# Patient Record
Sex: Female | Born: 1950 | Race: White | Hispanic: No | Marital: Married | Smoking: Former smoker
Health system: Southern US, Community
[De-identification: ages and names within clinical notes are randomized; demographics above are authoritative.]

## PROBLEM LIST (undated history)

## (undated) DIAGNOSIS — I219 Acute myocardial infarction, unspecified: Secondary | ICD-10-CM

## (undated) DIAGNOSIS — I671 Cerebral aneurysm, nonruptured: Secondary | ICD-10-CM

## (undated) DIAGNOSIS — Z8673 Personal history of transient ischemic attack (TIA), and cerebral infarction without residual deficits: Secondary | ICD-10-CM

## (undated) DIAGNOSIS — G2581 Restless legs syndrome: Secondary | ICD-10-CM

## (undated) DIAGNOSIS — G4733 Obstructive sleep apnea (adult) (pediatric): Secondary | ICD-10-CM

## (undated) DIAGNOSIS — K219 Gastro-esophageal reflux disease without esophagitis: Secondary | ICD-10-CM

## (undated) DIAGNOSIS — M1990 Unspecified osteoarthritis, unspecified site: Secondary | ICD-10-CM

## (undated) DIAGNOSIS — J189 Pneumonia, unspecified organism: Secondary | ICD-10-CM

## (undated) DIAGNOSIS — H269 Unspecified cataract: Secondary | ICD-10-CM

## (undated) DIAGNOSIS — R002 Palpitations: Secondary | ICD-10-CM

## (undated) DIAGNOSIS — E785 Hyperlipidemia, unspecified: Secondary | ICD-10-CM

## (undated) DIAGNOSIS — Z8719 Personal history of other diseases of the digestive system: Secondary | ICD-10-CM

## (undated) DIAGNOSIS — I714 Abdominal aortic aneurysm, without rupture, unspecified: Secondary | ICD-10-CM

## (undated) DIAGNOSIS — I2510 Atherosclerotic heart disease of native coronary artery without angina pectoris: Secondary | ICD-10-CM

## (undated) DIAGNOSIS — I4891 Unspecified atrial fibrillation: Secondary | ICD-10-CM

## (undated) DIAGNOSIS — I639 Cerebral infarction, unspecified: Secondary | ICD-10-CM

## (undated) DIAGNOSIS — R001 Bradycardia, unspecified: Secondary | ICD-10-CM

## (undated) DIAGNOSIS — E611 Iron deficiency: Secondary | ICD-10-CM

## (undated) DIAGNOSIS — I10 Essential (primary) hypertension: Secondary | ICD-10-CM

## (undated) HISTORY — PX: CARPAL TUNNEL RELEASE: SHX101

## (undated) HISTORY — PX: ABDOMINAL HYSTERECTOMY: SHX81

## (undated) HISTORY — DX: Essential (primary) hypertension: I10

## (undated) HISTORY — DX: Personal history of transient ischemic attack (TIA), and cerebral infarction without residual deficits: Z86.73

## (undated) HISTORY — DX: Unspecified atrial fibrillation: I48.91

## (undated) HISTORY — DX: Hyperlipidemia, unspecified: E78.5

## (undated) HISTORY — DX: Obstructive sleep apnea (adult) (pediatric): G47.33

## (undated) HISTORY — DX: Atherosclerotic heart disease of native coronary artery without angina pectoris: I25.10

## (undated) HISTORY — DX: Palpitations: R00.2

## (undated) HISTORY — DX: Unspecified cataract: H26.9

## (undated) HISTORY — DX: Bradycardia, unspecified: R00.1

---

## 2007-10-14 HISTORY — PX: CORONARY ARTERY BYPASS GRAFT: SHX141

## 2008-07-20 ENCOUNTER — Encounter: Payer: Self-pay | Admitting: Cardiology

## 2008-07-22 ENCOUNTER — Encounter: Payer: Self-pay | Admitting: Cardiology

## 2009-01-02 ENCOUNTER — Encounter: Payer: Self-pay | Admitting: Cardiology

## 2009-04-19 ENCOUNTER — Encounter: Payer: Self-pay | Admitting: Cardiology

## 2009-05-07 ENCOUNTER — Encounter: Payer: Self-pay | Admitting: Cardiology

## 2010-08-11 ENCOUNTER — Inpatient Hospital Stay (HOSPITAL_COMMUNITY): Admission: EM | Admit: 2010-08-11 | Discharge: 2010-08-13 | Payer: Self-pay | Admitting: Cardiology

## 2010-08-11 ENCOUNTER — Encounter: Payer: Self-pay | Admitting: Physician Assistant

## 2010-08-11 ENCOUNTER — Ambulatory Visit: Payer: Self-pay | Admitting: Internal Medicine

## 2010-08-12 ENCOUNTER — Encounter: Payer: Self-pay | Admitting: Cardiovascular Disease

## 2010-08-13 ENCOUNTER — Encounter: Payer: Self-pay | Admitting: Physician Assistant

## 2010-08-13 ENCOUNTER — Encounter: Payer: Self-pay | Admitting: Cardiology

## 2010-08-27 ENCOUNTER — Ambulatory Visit: Payer: Self-pay | Admitting: Physician Assistant

## 2010-08-27 DIAGNOSIS — I495 Sick sinus syndrome: Secondary | ICD-10-CM | POA: Insufficient documentation

## 2010-08-27 DIAGNOSIS — R002 Palpitations: Secondary | ICD-10-CM

## 2010-08-27 DIAGNOSIS — E785 Hyperlipidemia, unspecified: Secondary | ICD-10-CM

## 2010-09-01 ENCOUNTER — Encounter: Payer: Self-pay | Admitting: Physician Assistant

## 2010-09-13 ENCOUNTER — Ambulatory Visit: Payer: Self-pay | Admitting: Physician Assistant

## 2010-09-13 DIAGNOSIS — I498 Other specified cardiac arrhythmias: Secondary | ICD-10-CM

## 2010-09-19 ENCOUNTER — Encounter: Payer: Self-pay | Admitting: Physician Assistant

## 2010-09-30 ENCOUNTER — Ambulatory Visit: Payer: Self-pay | Admitting: Physician Assistant

## 2010-09-30 ENCOUNTER — Encounter: Payer: Self-pay | Admitting: Physician Assistant

## 2010-10-03 ENCOUNTER — Encounter (INDEPENDENT_AMBULATORY_CARE_PROVIDER_SITE_OTHER): Payer: Self-pay | Admitting: *Deleted

## 2010-10-29 ENCOUNTER — Ambulatory Visit
Admission: RE | Admit: 2010-10-29 | Discharge: 2010-10-29 | Payer: Self-pay | Source: Home / Self Care | Attending: Cardiology | Admitting: Cardiology

## 2010-10-29 DIAGNOSIS — R059 Cough, unspecified: Secondary | ICD-10-CM | POA: Insufficient documentation

## 2010-10-29 DIAGNOSIS — R05 Cough: Secondary | ICD-10-CM | POA: Insufficient documentation

## 2010-10-29 DIAGNOSIS — I10 Essential (primary) hypertension: Secondary | ICD-10-CM | POA: Insufficient documentation

## 2010-10-29 DIAGNOSIS — I2510 Atherosclerotic heart disease of native coronary artery without angina pectoris: Secondary | ICD-10-CM | POA: Insufficient documentation

## 2010-11-12 NOTE — Letter (Signed)
Summary: External Correspondence/ DR. Elmo Putt  External Correspondence/ DR. Elmo Putt   Imported By: Dorise Hiss 09/09/2010 15:27:59  _____________________________________________________________________  External Attachment:    Type:   Image     Comment:   External Document

## 2010-11-12 NOTE — Procedures (Signed)
Summary: Urgent MCOT Notification  Urgent MCOT Notification   Imported By: Cyril Loosen, RN, BSN 09/02/2010 09:14:48  _____________________________________________________________________  External Attachment:    Type:   Image     Comment:   External Document  Appended Document: Urgent MCOT Notification will review results at OV on 12/2

## 2010-11-12 NOTE — Cardiovascular Report (Signed)
Summary: Cardiac Cath Other  Cardiac Cath Other   Imported By: Zachary George 08/27/2010 10:25:44  _____________________________________________________________________  External Attachment:    Type:   Image     Comment:   External Document

## 2010-11-12 NOTE — Letter (Signed)
Summary: External Correspondence/ DR. Elmo Putt  External Correspondence/ DR. Elmo Putt   Imported By: Dorise Hiss 09/09/2010 15:29:35  _____________________________________________________________________  External Attachment:    Type:   Image     Comment:   External Document

## 2010-11-12 NOTE — Assessment & Plan Note (Signed)
Summary: [facility]   Visit Type:  Follow-up Primary Provider:  Harrel Carina   History of Present Illness: patient presents to this office for the first time, following recent hospitalization at [facility] with unstable angina. She presented initially to [facility] ED with history of multivessel CAD, status post CABG, 10/ 09, in [place], and remote PCI.  Patient had a peak troponin level of 0.08, and was found to have severe native 3 vessel CAD and mild LVD. She had patent LIMA-LAD and SVG-PDA grafts;  100% occluded SVG-OM graft; she underwent successful DES to native left circumflex (x2).  Since discharge, she has had some mild, recurrent "throat tightness", which she refers to as her presenting symptoms. She denies any frank exertional chest discomfort. These episodes occurred last Thursday, and were preceded by "fluttering", approximately one hour before.  Of note, patient states that she has been previously evaluated with a monitor, back in 2010. She was placed on medical therapy, and there was some discussion of possible pacemaker. During this admission, there was indication that she developed bradycardia. diltiazem was held, and labetalol was decreased.  Patient quit smoking in 2009.  Preventive Screening-Counseling & Management  Alcohol-Tobacco     Smoking Status: quit     Year Quit: 07/2008  Current Medications (verified): 1)  Aspirin 325 Mg Tabs (Aspirin) .... Take 1 Tablet By Mouth Once A Day 2)  Plavix 75 Mg Tabs (Clopidogrel Bisulfate) .... Take 1 Tablet By Mouth Once A Day 3)  Nitrostat 0.4 Mg Subl (Nitroglycerin) .... Use As Directed Chest Pain 4)  Lisinopril 40 Mg Tabs (Lisinopril) .... Take 1 Tablet By Mouth Once A Day 5)  Hydrochlorothiazide 25 Mg Tabs (Hydrochlorothiazide) .... Take 1 Tablet By Mouth Once A Day 6)  Alprazolam 0.25 Mg Tabs (Alprazolam) .... Take 1 Tablet By Mouth Three Times A Day 7)  Labetalol Hcl 200 Mg Tabs (Labetalol Hcl) .... Take 1  Tablet By Mouth Two Times A Day 8)  Hydralazine Hcl 25 Mg Tabs (Hydralazine Hcl) .... Take 1 Tablet By Mouth Three Times A Day 9)  Bupropion Hcl 100 Mg Tabs (Bupropion Hcl) .... Take 1 Tablet By Mouth Two Times A Day 10)  Simvastatin 40 Mg Tabs (Simvastatin) .... Take 1 Tablet By Mouth Once A Day 11)  Chelated Potassium 99 Mg Tabs (Potassium) .... Take 1 Tablet By Mouth Once A Day  Allergies (verified): No Known Drug Allergies  Comments:  Nurse/Medical Assistant: The patient's medication bottles and allergies were reviewed with the patient and were updated in the Medication and Allergy Lists.  Social History: Smoking Status:  quit  Review of Systems       No fevers, chills, hemoptysis, dysphagia, melena, hematocheezia, hematuria, rash, claudication, orthopnea, pnd, pedal edema. All other systems negative.   Vital Signs:  Patient profile:   60 year old female Height:      65 inches Weight:      186 pounds BMI:     31.06 Pulse rate:   71 / minute BP sitting:   167 / 90  (left arm) Cuff size:   regular  Vitals Entered By: Carlye Grippe (August 27, 2010 9:56 AM)  Nutrition Counseling: Patient's BMI is greater than 25 and therefore counseled on weight management options.  Physical Exam  Additional Exam:  GEN: 12 -year-old female, sitting upright, no distress HEENT: NCAT,PERRLA,EOMI NECK: palpable pulses, no bruits; no JVD; no TM LUNGS: CTA bilaterally HEART: RRR (S1S2); no significant murmurs; no rubs; no gallops ABD: soft,  NT; intact BS EXT: palpable right femoral pulse; no hematoma, bruit; no edema SKIN: warm, dry MUSC: no obvious deformity NEURO: A/O (x3)     EKG  Procedure date:  08/27/2010  Findings:      normal sinus rhythm at 65 bpm; normal axist; persistent ST coving in inferior leads; new, small T-wave inversion in V3, with otherwise persistent small T-wave inversion changes in V4-V6  Impression & Recommendations:  Problem # 1:  CAD  (ICD-414.00)  patient is status post recent DES of native circumflex artery, with residual severe native three-vessel CAD and 2/3 patent bypass grafts; mild LVD (EF 50%). She has had some residual chest discomfort, of brief duration, for which she has not taken nitroglycerin. Of note, this was preceded by palpitations. Also, patient did not take any of her medications, prior to today's visit. She does report compliance with her medications, including Plavix, and was provided with an application for financial assistance. I reviewed today's EKG with Dr. Diona Browner, who felt that there was no significant change with respect to her previous study at [facility]. Patient currently denies any chest discomfort, but has some intermittent atypical symptoms. She denies any frank exertional chest discomfort. Will continue current medication regimen, and schedule early clinic visit with me and Dr. Diona Browner, in approximate 3 weeks.  Orders: EKG w/ Interpretation (93000)  Problem # 2:  PALPITATIONS (ICD-785.1)  Will order CardioNet monitor (x3 weeks), to rule out intermittent dysrhythmia. Patient states she has previously been evaluated for this in 2010, in [place], [place]. We will request records from her previous cardiologist, Dr. Clarene Critchley.  Orders: Cardiac Catheterization (Cardiac Cath) EKG w/ Interpretation (93000)  Problem # 3:  DYSLIPIDEMIA (ICD-272.4)  aggressive management recommend with target LDL 70 or less, if feasible. We'll need to reassess lipid status in 12 weeks.  Problem # 4:  HYPERTENSION (ICD-401.9)  currently uncontrolled; however, patient did not take any of her medications this morning. We'll continue to monitor closely.  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  Orders: Cardiac Catheterization (Cardiac Cath)  Patient  Instructions: 1)  Follow up with Gene on Friday, September 13, 2010 at 1:40pm. 2)  Your physician has recommended that you wear an event monitor.  Event monitors are medical devices that record the heart's electrical activity. Doctors most often use these monitors to diagnose arrhythmias. Arrhythmias are problems with the speed or rhythm of the heartbeat. The monitor is a small, portable device. You can wear one while you do your normal daily activities. This is usually used to diagnose what is causing palpitations/syncope (passing out). 3)  Your physician recommends that you continue on your current medications as directed. Please refer to the Current Medication list given to you today.  Appended Document: Orders Update    Clinical Lists Changes  Orders: Added new Referral order of Cardionet/Event Monitor (Cardionet/Event) - Signed

## 2010-11-12 NOTE — Assessment & Plan Note (Signed)
Summary: 2 WK F/U PER 11/15 OV W/GENE-JM   Visit Type:  Follow-up Primary Provider:  Harrel Carina   History of Present Illness: patient returns for early scheduled followup.   When last seen, I ordered a cardiac monitor for evaluation of frequent tachypalpitations. I just now reviewed these strips, in conjunction with Dr. Diona Browner. There are some strips which are suggestive of atrial fibrillation/flutter, but of brief duration. Rates are in the 150-180 range. Additional strips were interpreted as PSVT; frequent PACs a brief runs of AT, were also noted.  Patient does report recurrent tachypalpitations, perhaps 2-3 times a week. She reports some associated weakness and fatigue, but no presyncope/syncope.  She denies any symptoms similar to her recent presentation, requiring PCI at [facility]. She also denies symptoms suggestive of CHF.  Preventive Screening-Counseling & Management  Alcohol-Tobacco     Smoking Status: quit     Year Quit: 2009  Caffeine-Diet-Exercise     Does Patient Exercise: yes  Current Medications (verified): 1)  Aspirin 325 Mg Tabs (Aspirin) .... Take 1 Tablet By Mouth Once A Day 2)  Plavix 75 Mg Tabs (Clopidogrel Bisulfate) .... Take 1 Tablet By Mouth Once A Day 3)  Nitrostat 0.4 Mg Subl (Nitroglycerin) .... Use As Directed Chest Pain 4)  Lisinopril 40 Mg Tabs (Lisinopril) .... Take 1 Tablet By Mouth Once A Day 5)  Hydrochlorothiazide 25 Mg Tabs (Hydrochlorothiazide) .... Take 1 Tablet By Mouth Once A Day 6)  Alprazolam 0.25 Mg Tabs (Alprazolam) .... Take 1 Tablet By Mouth Three Times A Day 7)  Labetalol Hcl 200 Mg Tabs (Labetalol Hcl) .... Take 1 Tablet By Mouth Two Times A Day 8)  Hydralazine Hcl 25 Mg Tabs (Hydralazine Hcl) .... Take 1 Tablet By Mouth Three Times A Day 9)  Bupropion Hcl 100 Mg Tabs (Bupropion Hcl) .... Take 1 Tablet By Mouth Two Times A Day 10)  Simvastatin 40 Mg Tabs (Simvastatin) .... Take 1 Tablet By Mouth Once A Day 11)  Chelated  Potassium 99 Mg Tabs (Potassium) .... Take 1 Tablet By Mouth Once A Day  Allergies (verified): No Known Drug Allergies  Comments:  Nurse/Medical Assistant: The patient's medication bottles and allergies were reviewed with the patient and were updated in the Medication and Allergy Lists.  Past History:  Past Medical History: CAD... DES of native CFX,10/10, residual 100% occluded SVG-OM; patent LIMA-LAD, SVG-PDA grafts; EF 50%... status post CABG,10/09, [place], [place] Hyperlipidemia Hypertension bradycardia... diltiazem discontinued  Past Surgical History: hysterectomy 1984 bilateral carpul tunnel surgery  Family History: Father:died age 1 MI Mother:healthy Siblings:1 brother HTN 1 sister-healthy 1 daughter- healthy 1 son-healty  Social History: Disabled  Married  Tobacco Use - Former.  Alcohol Use - yes  occasional wine or beer Regular Exercise - yes Does Patient Exercise:  yes  Review of Systems       No fevers, chills, hemoptysis, dysphagia, melena, hematocheezia, hematuria, rash, claudication, orthopnea, pnd, pedal edema. All other systems negative.   Vital Signs:  Patient profile:   60 year old female Height:      65 inches Weight:      187 pounds Pulse rate:   71 / minute BP sitting:   142 / 87  (left arm) Cuff size:   large  Vitals Entered By: Carlye Grippe (September 13, 2010 1:49 PM)  Physical Exam  Additional Exam:  GEN: 69 -year-old female, sitting upright, no distress HEENT: NCAT,PERRLA,EOMI NECK: palpable pulses, no bruits; no JVD; no TM LUNGS: CTA bilaterally HEART:  RRR (S1S2); no significant murmurs; no rubs; no gallops ABD: soft, NT; intact BS EXT: palpable right femoral pulse; no hematoma, bruit; no edema SKIN: warm, dry MUSC: no obvious deformity NEURO: A/O (x3)     Impression & Recommendations:  Problem # 1:  SUPRAVENTRICULAR TACHYCARDIA (ICD-427.89)  Will adjust current medication regimen with substitution of labetalol with  Lopressor 25 mg b.i.d. rhythm strips have been reviewed, and some are suggestive of PAF/flutter with rates in the 150-170 range. She has a CHADS score of 1 (HTN). She is currently on full dose aspirin and Plavix, following recent drug-eluting stening. We'll need to monitor closely for recurrent bradycardia, for which she was recently taken off diltiazem, at [facility]. Will schedule early return visit with me in the next 2-3 weeks, for reassessment of frequency of palpitations, and close monitoring of heart rate.  Problem # 2:  CAD (ICD-414.00)  quiesced on current medication regimen. Continue Plavix for at least one year.  Problem # 3:  HYPERTENSION (ICD-401.9)  stable on current medication regimen  Problem # 4:  DYSLIPIDEMIA (ICD-272.4)  Will need to reassess next 4-6 weeks.  Patient Instructions: 1)  Follow up office visit with Gene on Monday, September 30, 2010 at 10:20am. 2)  Return cardionet monitor as instructed. 3)  Stop Labetalol. 4)  Start Lopressor (metoprolol tart) 25mg  by mouth two times a day.  Prescriptions: METOPROLOL TARTRATE 25 MG TABS (METOPROLOL TARTRATE) Take one tablet by mouth twice a day  #60 x 6   Entered by:   Cyril Loosen, RN, BSN   Authorized by:   Nelida Meuse, PA-C   Signed by:   Cyril Loosen, RN, BSN on 09/13/2010   Method used:   Electronically to        [facility]* (retail)       [address]       [place], [place]  [address]       Ph: [phone]       Fax: [phone]   RxID:   [id number]   Handout requested.  I have reviewed and approved all prescriptions at the time of the office visit. Nelida Meuse, PA-C  September 13, 2010 2:56 PM

## 2010-11-12 NOTE — Procedures (Signed)
Summary: Holter and Event/ [facility] / [facility]  Holter and Event/ [facility] / [facility]   Imported By: Dorise Hiss 09/09/2010 16:00:39  _____________________________________________________________________  External Attachment:    Type:   Image     Comment:   External Document

## 2010-11-14 NOTE — Letter (Signed)
Summary: Engineer, materials at [facility]  [address]   [place], [place] [address]   Phone: [phone]  Fax: [phone]        October 03, 2010 MRN: [id number]    [facility] [address] [place], [place]  [address]    Dear Ms. Fons,  Your test ordered by Selena Batten has been reviewed by your physician (or physician assistant) and was found to be normal or stable. Your physician (or physician assistant) felt no changes were needed at this time.  ____ Echocardiogram  ____ Cardiac Stress Test  __X__ Lab Work  ____ Peripheral vascular study of arms, legs or neck  ____ CT scan or X-ray  ____ Lung or Breathing test  ____ Other:   Thank you.   Cyril Loosen, RN, BSN    Duane Boston, M.D., F.A.C.C. Thressa Sheller, M.D., F.A.C.C. Oneal Grout, M.D., F.A.C.C. Cheree Ditto, M.D., F.A.C.C. Daiva Nakayama, M.D., F.A.C.C. Kenney Houseman, M.D., F.A.C.C. Jeanne Ivan, PA-C

## 2010-11-14 NOTE — Procedures (Signed)
Summary: MCOT END OF SUMMARY  MCOT END OF SUMMARY   Imported By: Cyril Loosen, RN, BSN 09/23/2010 09:53:38  _____________________________________________________________________  External Attachment:    Type:   Image     Comment:   External Document  Appended Document: MCOT END OF SUMMARY Pt will be notified of results at office visit on 12/19.

## 2010-11-14 NOTE — Assessment & Plan Note (Signed)
Summary: 2 WK F/U PER 12/2 OV-JM   Visit Type:  Follow-up Primary Provider:  Harrel Carina   History of Present Illness: patient presents for early scheduled followup. Please refer to my recent office note of 09/13/10, for complete details.  Patient reports some slight improvement in the frequency and intensity of her palpitations, since I substituted her labetalol with Lopressor. However, she continues to experience these essentially on a daily basis. Recent cardiac monitor strips were reviewed, in conjunction with Dr. Diona Browner, which were possibly suggestive of transient PAF/flutter with rates as high as 180 bpm, as well as frequent PACs and runs of AT. We previously assessed her as having a CHADS score of 1.  She also continues to experience occasional exertional throat tightness, reminiscent of her symptoms prior to her recent hospitalization. However, she has not had to take any nitroglycerin. She also sometimes has these symptoms in conjunction with her palpitations. Overall, she feels much better, since her hospitalization.  Preventive Screening-Counseling & Management  Alcohol-Tobacco     Smoking Status: quit     Year Quit: 2009  Current Medications (verified): 1)  Aspirin 325 Mg Tabs (Aspirin) .... Take 1 Tablet By Mouth Once A Day 2)  Plavix 75 Mg Tabs (Clopidogrel Bisulfate) .... Take 1 Tablet By Mouth Once A Day 3)  Nitrostat 0.4 Mg Subl (Nitroglycerin) .... Use As Directed Chest Pain 4)  Lisinopril 40 Mg Tabs (Lisinopril) .... Take 1 Tablet By Mouth Once A Day 5)  Hydrochlorothiazide 25 Mg Tabs (Hydrochlorothiazide) .... Take 1 Tablet By Mouth Once A Day 6)  Alprazolam 0.25 Mg Tabs (Alprazolam) .... Take 1 Tablet By Mouth Three Times A Day 7)  Hydralazine Hcl 25 Mg Tabs (Hydralazine Hcl) .... Take 1 Tablet By Mouth Three Times A Day 8)  Bupropion Hcl 100 Mg Tabs (Bupropion Hcl) .... Take 1 Tablet By Mouth Two Times A Day 9)  Simvastatin 40 Mg Tabs (Simvastatin) .... Take 1  Tablet By Mouth Once A Day 10)  Chelated Potassium 99 Mg Tabs (Potassium) .... Take 1 Tablet By Mouth Once A Day 11)  Metoprolol Tartrate 25 Mg Tabs (Metoprolol Tartrate) .... Take One Tablet By Mouth Twice A Day  Allergies (verified): No Known Drug Allergies  Comments:  Nurse/Medical Assistant: The patient's medications and allergies were reviewed with the patient and were updated in the Medication and Allergy Lists. Pt brought medication bottles to office visit.    Past History:  Past Medical History: Last updated: 09/13/2010 CAD... DES of native CFX,10/10, residual 100% occluded SVG-OM; patent LIMA-LAD, SVG-PDA grafts; EF 50%... status post CABG,10/09, [place], [place] Hyperlipidemia Hypertension bradycardia... diltiazem discontinued  Review of Systems       No fevers, chills, hemoptysis, dysphagia, melena, hematocheezia, hematuria, rash, claudication, orthopnea, pnd, pedal edema. All other systems negative.   Vital Signs:  Patient profile:   60 year old female Height:      65 inches Weight:      186 pounds Pulse rate:   71 / minute BP sitting:   155 / 89  (left arm) Cuff size:   regular  Vitals Entered By: Carlye Grippe (September 30, 2010 10:44 AM)  Physical Exam  Additional Exam:  GEN: 24 -year-old female, sitting upright, no distress HEENT: NCAT,PERRLA,EOMI NECK: palpable pulses, no bruits; no JVD; no TM LUNGS: CTA bilaterally HEART: RRR (S1S2); no significant murmurs; no rubs; no gallops ABD: soft, NT; intact BS EXT: palpable right femoral pulse; no hematoma, bruit; no edema SKIN: warm, dry MUSC: no  obvious deformity NEURO: A/O (x3)     Impression & Recommendations:  Problem # 1:  CAD (ICD-414.00)  Will add isosorbide mononitrate 30 mg daily, for added antianginal benefit. We'll schedule return visit with me in one month, for continued close monitoring and further possible medication adjustments.  Problem # 2:  PALPITATIONS (ICD-785.1)  Will increase  Lopressor to 50 mg b.i.d., for more aggressive treatment of her breakthrough tachycardia palpitations. Recent monitor indicated transient runs of PSVT, with possible PAF/flutter. She has a CHADS score of 1, and is currently on full dose aspirin/Plavix, following recent treatment with DES.  Problem # 3:  DYSLIPIDEMIA (ICD-272.4)  we'll reassess lipid status with fasting lipid profile. Continue current dose of simvastatin, pending results of her blood work.  Other Orders: T-Lipid Profile [phone]) T-Hepatic Function [phone])  Patient Instructions: 1)  Follow up with Dr. Diona Browner on Tues, October 29, 2010 at 1:40pm. 2)  Start Imdur (isosorbide) 30mg  by mouth once daily. 3)  Increase Lopressor (metoprolol) to 50mg  by mouth two times a day. You may take 2 of your 25mg  tablets until gone and then get new prescription for 50mg  tablets filled. 4)  Your physician recommends that you go to the [facility] for a FASTING lipid profile and liver function labs:  Do not eat or drink after midnight.  Prescriptions: METOPROLOL TARTRATE 50 MG TABS (METOPROLOL TARTRATE) Take one tablet by mouth twice a day  #60 x 6   Entered by:   Cyril Loosen, RN, BSN   Authorized by:   Nelida Meuse, PA-C   Signed by:   Cyril Loosen, RN, BSN on 09/30/2010   Method used:   Electronically to        [facility]* (retail)       [address]       [place], [place]  [address]       Ph: [phone]       Fax: [phone]   RxID:   [id number] ISOSORBIDE MONONITRATE CR 30 MG XR24H-TAB (ISOSORBIDE MONONITRATE) Take one tablet by mouth daily  #30 x 6   Entered by:   Cyril Loosen, RN, BSN   Authorized by:   Nelida Meuse, PA-C   Signed by:   Cyril Loosen, RN, BSN on 09/30/2010   Method used:   Electronically to        [facility]* (retail)       [address]       [place], [place]  [address]       Ph: [phone]       Fax: [phone]   RxID:    [id number]   Handout requested.  I have reviewed and approved all prescriptions at the time of the office visit. Nelida Meuse, PA-C  September 30, 2010 11:17 AM

## 2010-11-14 NOTE — Assessment & Plan Note (Signed)
Summary: 1 MO F/U PER 12/19 OV W/GENE-JM   Visit Type:  Follow-up Primary Provider:  Dr. Caryl Asp   History of Present Illness: 60 year old one presents for followup. She was last seen in December by Mr. Serpe.   She has had palpitations and a recent monitor indicated transient runs of PSVT, with possible PAF/flutter. She has a CHADS score of 1, and is currently on full dose aspirin/Plavix, following recent treatment with DES. Beta blocker dosing was advanced at the last visit, also placed on Imdur for angina suppression. She reports feeling better, fewer palpitations in general, no progressive angina.  Labs from 19 December showed AST 24, ALT 26, cholesterol 135, triglycerides 127, HDL 33, LDL 77. She states that she takes "vinegar" and reports compliance with her simvastatin.   Reports a long-term intermittent cough, questioning the possibility of medication side effect. She has been on lisinopril long-term.  No reported return to tobacco use since 2009 except for one "slip up." We discussed her diet today, also sodium restriction.  Preventive Screening-Counseling & Management  Alcohol-Tobacco     Smoking Status: quit     Year Quit: 2009  Current Medications (verified): 1)  Aspirin 325 Mg Tabs (Aspirin) .... Take 1 Tablet By Mouth Once A Day 2)  Plavix 75 Mg Tabs (Clopidogrel Bisulfate) .... Take 1 Tablet By Mouth Once A Day 3)  Nitrostat 0.4 Mg Subl (Nitroglycerin) .... Use As Directed Chest Pain 4)  Lisinopril 40 Mg Tabs (Lisinopril) .... Take 1 Tablet By Mouth Once A Day 5)  Hydrochlorothiazide 25 Mg Tabs (Hydrochlorothiazide) .... Take 1 Tablet By Mouth Once A Day 6)  Hydralazine Hcl 25 Mg Tabs (Hydralazine Hcl) .... Take 1 Tablet By Mouth Three Times A Day 7)  Bupropion Hcl 100 Mg Tabs (Bupropion Hcl) .... Take 1 Tablet By Mouth Two Times A Day 8)  Simvastatin 40 Mg Tabs (Simvastatin) .... Take 1 Tablet By Mouth Once A Day 9)  Chelated Potassium 99 Mg Tabs (Potassium) ....  Take 1 Tablet By Mouth Once A Day 10)  Metoprolol Tartrate 50 Mg Tabs (Metoprolol Tartrate) .... Take One Tablet By Mouth Twice A Day 11)  Isosorbide Mononitrate Cr 30 Mg Xr24h-Tab (Isosorbide Mononitrate) .... Take One Tablet By Mouth Daily  Allergies (verified): No Known Drug Allergies  Comments:  Nurse/Medical Assistant: The patient's medications and allergies were reviewed with the patient and were updated in the Medication and Allergy Lists. Reviewed list w/ pt. Tammi Romine CMA (October 29, 2010 1:45 PM)  Past History:  Social History: Last updated: 09/13/2010 Disabled  Married  Tobacco Use - Former.  Alcohol Use - yes  occasional wine or beer Regular Exercise - yes  Past Medical History: CAD - DES circ 10/10, residual 100% occluded SVG-OM, patent LIMA-LAD and SVG-PDA grafts; EF 50% Hyperlipidemia Hypertension Bradycardia Paliptations - transient PAT/AF, CHADS2 score 1 Cataracts  Past Surgical History: Hysterectomy Bilateral carpul tunnel surgery CABG [facility], LIMA to LAD, SVG to OM, SVG to PDA  Family History: Father: died age 43 MI Mother: healthy Siblings: 1 brother HTN, 1 sister - healthy 1 daughter- healthy 1 son - healthy  Review of Systems       The patient complains of dyspnea on exertion and prolonged cough.  The patient denies anorexia, fever, chest pain, syncope, hemoptysis, abdominal pain, melena, and hematochezia.         No syncope. Appetite stable. Otherwise reviewed and negative.  Vital Signs:  Patient profile:   61 year old  female Height:      65 inches Weight:      189 pounds BMI:     31.56 Pulse rate:   58 / minute BP sitting:   156 / 81  (left arm) Cuff size:   regular  Vitals Entered By: Fuller Plan CMA (October 29, 2010 1:45 PM)  Physical Exam  Additional Exam:  Overweight woman in no acute distress. HEENT: Conjunctiva and lids normal, oropharynx with moist mucosa. Neck: Supple, no elevated JVP, no bruits, no  thyromegaly. Lungs: Coarse breath sounds, otherwise clear, nonlabored. Cardiac: Regular rate and rhythm, no significant murmur, no S3. Abdomen: Soft, nontender pelvis is present. Extremities: Distal pulses one plus, no edema. Skin: Warm and dry, tattoo noted on back. Musculoskeletal: No kyphosis. Neuropsychiatric: Alert and oriented x3, affect appropriate.   Cardiac Cath  Procedure date:  08/13/2010  Findings:       PROCEDURAL FINDINGS:  Aortic pressure 186/96 with a mean of 129, left   ventricular pressure 174/15.      Left ventriculography shows inferobasal akinesis with a left ventricular   ejection fraction estimated at 50%.  The other LV wall segments are   normal.      NATIVE CORONARY ANGIOGRAPHY:  The right coronary artery is totally   occluded in the proximal aspect.      Left mainstem:  There is diffuse calcium in the left main with 40%   distal stenosis.      LAD:  The LAD is totally occluded at the ostium.  The vessel fills   entirely from the LIMA graft.      Ramus intermedius is a large branch.  There is no significant stenosis.   There are minor luminal irregularities.      Left circumflex:  The left circumflex is a tortuous vessel.  It has a   90% proximal lesion and a 95% mid lesion.  There is retrograde filling   of the occluded vein graft, which was anastomosed to the marginal   branch.      BYPASS GRAFT ANGIOGRAPHY:  The left internal mammary to LAD is widely   patent.  There is no significant stenosis in the LAD, which back-fills   to the proximal vessel.      Saphenous vein graft to the PDA is patent.  This also retrograde fills   the posterolateral branch of the vessel.      Saphenous vein graft to OM is occluded at the aortic anastomotic site.   Impression & Recommendations:  Problem # 1:  PALPITATIONS (ICD-785.1)  Better control following increase in Lopressor. No other changes made today. As noted previously, cardiac monitoring demonstrated  brief bursts of atrial tachycardia and possibly atrial fibrillation. Low CHADS2 score is noted. No plan for anticoagulation at this point.  Her updated medication list for this problem includes:    Aspirin 325 Mg Tabs (Aspirin) .Marland Kitchen... Take 1 tablet by mouth once a day    Plavix 75 Mg Tabs (Clopidogrel bisulfate) .Marland Kitchen... Take 1 tablet by mouth once a day    Nitrostat 0.4 Mg Subl (Nitroglycerin) ..... Use as directed chest pain    Metoprolol Tartrate 50 Mg Tabs (Metoprolol tartrate) .Marland Kitchen... Take one tablet by mouth twice a day    Isosorbide Mononitrate Cr 30 Mg Xr24h-tab (Isosorbide mononitrate) .Marland Kitchen... Take one tablet by mouth daily  Problem # 2:  CORONARY ATHEROSCLEROSIS NATIVE CORONARY ARTERY (ICD-414.01)  No progressive angina. Continue present regimen. Followup scheduled for 3 months.  Her updated medication  list for this problem includes:    Aspirin 325 Mg Tabs (Aspirin) .Marland Kitchen... Take 1 tablet by mouth once a day    Plavix 75 Mg Tabs (Clopidogrel bisulfate) .Marland Kitchen... Take 1 tablet by mouth once a day    Nitrostat 0.4 Mg Subl (Nitroglycerin) ..... Use as directed chest pain    Metoprolol Tartrate 50 Mg Tabs (Metoprolol tartrate) .Marland Kitchen... Take one tablet by mouth twice a day    Isosorbide Mononitrate Cr 30 Mg Xr24h-tab (Isosorbide mononitrate) .Marland Kitchen... Take one tablet by mouth daily  Problem # 3:  ESSENTIAL HYPERTENSION, BENIGN (ICD-401.1)  Blood pressure still not adequately controlled. Plan to advance hydralazine to 50 mg p.o. t.i.d. We discussed sodium restriction as well.  Her updated medication list for this problem includes:    Aspirin 325 Mg Tabs (Aspirin) .Marland Kitchen... Take 1 tablet by mouth once a day    Cozaar 50 Mg Tabs (Losartan potassium) .Marland Kitchen... Take 1 tablet by mouth once a day    Hydrochlorothiazide 25 Mg Tabs (Hydrochlorothiazide) .Marland Kitchen... Take 1 tablet by mouth once a day    Hydralazine Hcl 50 Mg Tabs (Hydralazine hcl) .Marland Kitchen... Take 1 tablet by mouth three times a day    Metoprolol Tartrate 50 Mg Tabs  (Metoprolol tartrate) .Marland Kitchen... Take one tablet by mouth twice a day  Problem # 4:  COUGH (ICD-786.2)  Chronic. Patient has been on lisinopril long-term. Will try to take her off lisinopril and replace it with Cozaar to see if this leads to any improvement.  Her updated medication list for this problem includes:     Aspirin 325 Mg Tabs (Aspirin) .Marland Kitchen... Take 1 tablet by mouth once a day    Plavix 75 Mg Tabs (Clopidogrel bisulfate) .Marland Kitchen... Take 1 tablet by mouth once a day    Nitrostat 0.4 Mg Subl (Nitroglycerin) ..... Use as directed chest pain    Metoprolol Tartrate 50 Mg Tabs (Metoprolol tartrate) .Marland Kitchen... Take one tablet by mouth twice a day    Isosorbide Mononitrate Cr 30 Mg Xr24h-tab (Isosorbide mononitrate) .Marland Kitchen... Take one tablet by mouth daily  Problem # 5:  DYSLIPIDEMIA (ICD-272.4)  Recent LDL looked favorable. Continue present regimen.  Her updated medication list for this problem includes:    Simvastatin 40 Mg Tabs (Simvastatin) .Marland Kitchen... Take 1 tablet by mouth once a day  Patient Instructions: 1)  Your physician wants you to follow-up in: 3 months. You will receive a reminder letter in the mail about two months in advance. If you don't receive a letter, please call our office to schedule the follow-up appointment. 2)  Your physician has recommended you make the following change in your medication: INCREASE HYDRALAZINE TO 50MG  THREE TIMES DAILY. You may take (2) of your 25mg  three times daily until they are finished. STOP LISINOPRIL AND START LOSARTAN 50MG  ONE DAILY. Your new prescriptions have been sent to your pharmacy. Prescriptions: HYDRALAZINE HCL 50 MG TABS (HYDRALAZINE HCL) Take 1 tablet by mouth three times a day  #90 x 3   Entered by:   Carlye Grippe   Authorized by:   Loreli Slot, MD, [facility]   Signed by:   Carlye Grippe on 10/29/2010   Method used:   Electronically to        [facility]* (retail)       [address]       [place], [place]  [address]        Ph: [phone]       Fax: [phone]   RxID:  [id number] COZAAR 50 MG TABS (LOSARTAN POTASSIUM) Take 1 tablet by mouth once a day  #30 x 3   Entered by:   Carlye Grippe   Authorized by:   Loreli Slot, MD, [facility]   Signed by:   Carlye Grippe on 10/29/2010   Method used:   Electronically to        [facility]* (retail)       [address]       [place], [place]  [address]       Ph: [phone]       Fax: [phone]   RxID:   [id number]

## 2010-11-15 NOTE — Consult Note (Signed)
Summary: CARDIOLOGY CONSULT/ [facility]  CARDIOLOGY CONSULT/ [facility]   Imported By: Zachary George 08/27/2010 10:37:32  _____________________________________________________________________  External Attachment:    Type:   Image     Comment:   External Document

## 2010-12-24 LAB — CBC
HCT: 44.5 % (ref 36.0–46.0)
MCV: 91.8 fL (ref 78.0–100.0)
RBC: 4.85 MIL/uL (ref 3.87–5.11)
WBC: 5.5 10*3/uL (ref 4.0–10.5)

## 2010-12-24 LAB — BASIC METABOLIC PANEL
Chloride: 106 mEq/L (ref 96–112)
GFR calc Af Amer: >60 mL/min (ref >60)
GFR calc non Af Amer: >60 mL/min (ref >60)
Potassium: 4 mEq/L (ref 3.5–5.1)
Sodium: 139 mEq/L (ref 135–145)

## 2010-12-25 LAB — CARDIAC PANEL(CRET KIN+CKTOT+MB+TROPI)
CK, MB: 4.5 ng/mL — ABNORMAL HIGH (ref 0.3–4.0)
Relative Index: 3.3 — ABNORMAL HIGH (ref 0.0–2.5)
Total CK: 127 U/L (ref 7–177)
Troponin I: 0.07 ng/mL — ABNORMAL HIGH (ref 0.00–0.06)
Troponin I: 0.08 ng/mL — ABNORMAL HIGH (ref 0.00–0.06)

## 2010-12-25 LAB — TSH: TSH: 2.548 u[IU]/mL (ref 0.350–4.500)

## 2010-12-25 LAB — COMPREHENSIVE METABOLIC PANEL
ALT: 28 U/L (ref 0–35)
AST: 24 U/L (ref 0–37)
Calcium: 9.3 mg/dL (ref 8.4–10.5)
Creatinine, Ser: 0.61 mg/dL (ref 0.4–1.2)
GFR calc Af Amer: >60 mL/min (ref >60)
GFR calc non Af Amer: >60 mL/min (ref >60)
Glucose, Bld: 95 mg/dL (ref 70–99)
Sodium: 139 mEq/L (ref 135–145)
Total Protein: 6.2 g/dL (ref 6.0–8.3)

## 2010-12-25 LAB — CBC
HCT: 42.4 % (ref 36.0–46.0)
MCHC: 33.7 g/dL (ref 30.0–36.0)
Platelets: 199 10*3/uL (ref 150–400)
RDW: 12.7 % (ref 11.5–15.5)
WBC: 5 10*3/uL (ref 4.0–10.5)

## 2010-12-25 LAB — LIPID PANEL
HDL: 33 mg/dL — ABNORMAL LOW (ref >39)
LDL Cholesterol: 78 mg/dL (ref 0–99)
Total CHOL/HDL Ratio: 3.9 RATIO
Triglycerides: 88 mg/dL (ref <150)
VLDL: 18 mg/dL (ref 0–40)

## 2010-12-25 LAB — APTT: aPTT: 47 seconds — ABNORMAL HIGH (ref 24–37)

## 2010-12-25 LAB — HEPARIN LEVEL (UNFRACTIONATED): Heparin Unfractionated: 0.27 IU/mL — ABNORMAL LOW (ref 0.30–0.70)

## 2010-12-25 LAB — PROTIME-INR: Prothrombin Time: 12.3 seconds (ref 11.6–15.2)

## 2011-01-30 ENCOUNTER — Encounter: Payer: Self-pay | Admitting: *Deleted

## 2011-01-31 ENCOUNTER — Ambulatory Visit (INDEPENDENT_AMBULATORY_CARE_PROVIDER_SITE_OTHER): Payer: 59 | Admitting: Cardiology

## 2011-01-31 ENCOUNTER — Encounter: Payer: Self-pay | Admitting: Cardiology

## 2011-01-31 VITALS — BP 159/90 | HR 72 | Wt 198.0 lb

## 2011-01-31 DIAGNOSIS — I10 Essential (primary) hypertension: Secondary | ICD-10-CM

## 2011-01-31 DIAGNOSIS — Z79899 Other long term (current) drug therapy: Secondary | ICD-10-CM

## 2011-01-31 DIAGNOSIS — E785 Hyperlipidemia, unspecified: Secondary | ICD-10-CM

## 2011-01-31 DIAGNOSIS — R002 Palpitations: Secondary | ICD-10-CM

## 2011-01-31 DIAGNOSIS — I2510 Atherosclerotic heart disease of native coronary artery without angina pectoris: Secondary | ICD-10-CM

## 2011-01-31 MED ORDER — LOSARTAN POTASSIUM 100 MG PO TABS: 100.0000 mg | ORAL_TABLET | Freq: Every day | ORAL | Status: DC

## 2011-01-31 NOTE — Progress Notes (Signed)
Clinical Summary Heather Livingston is a 60 y.o.female presenting for followup. She was seen in January 2012. At that time medication adjustments were made including switch from lisinopril to Cozaar related to cough, also increase in hydralazine. Palpitations were better controlled on beta blocker therapy.  She reports being fatigued, having some night sweats and snoring. I asked her to speak with her primary care physician, Dr. Melvyn Livingston, about arranging a sleep study to assess for potential sleep apnea. We also discussed weight loss and exercise. She is not exercising at this point, sighting problems with her legs.  She brought a copy of recent lab work from March showing hemoglobin 15.4, platelets 235, potassium 4.5, BUN 21, creatinine 0.6, AST 22, ALT 21, cholesterol 137, triglycerides 113, HDL 31, LDL 83, TSH 1.4.   No Known Allergies  Current outpatient prescriptions:aspirin 325 MG tablet, Take 325 mg by mouth daily.  , Disp: , Rfl: ;  buPROPion (WELLBUTRIN) 100 MG tablet, Take 100 mg by mouth 2 (two) times daily.  , Disp: , Rfl: ;  Chelated Potassium 99 MG TABS, Take 1 tablet by mouth daily.  , Disp: , Rfl: ;  clopidogrel (PLAVIX) 75 MG tablet, Take 75 mg by mouth daily.  , Disp: , Rfl:  hydrALAZINE (APRESOLINE) 50 MG tablet, Take 50 mg by mouth 3 (three) times daily.  , Disp: , Rfl: ;  hydrochlorothiazide 25 MG tablet, Take 25 mg by mouth daily.  , Disp: , Rfl: ;  isosorbide mononitrate (IMDUR) 30 MG 24 hr tablet, Take 30 mg by mouth daily.  , Disp: , Rfl: ;  losartan (COZAAR) 50 MG tablet, Take 50 mg by mouth daily.  , Disp: , Rfl: ;  metoprolol (LOPRESSOR) 50 MG tablet, Take 50 mg by mouth 2 (two) times daily.  , Disp: , Rfl:  nitroGLYCERIN (NITROSTAT) 0.4 MG SL tablet, Place 0.4 mg under the tongue every 5 (five) minutes as needed.  , Disp: , Rfl: ;  simvastatin (ZOCOR) 40 MG tablet, Take 40 mg by mouth at bedtime.  , Disp: , Rfl:   Past Medical History  Diagnosis Date  . Coronary atherosclerosis of  native coronary artery     DES circ 10/10, residual 100% occluded SVG to OM, patent LIMA to LAD and SVG to PDA grafts, LVEF 50%  . Hyperlipemia   . Hypertension   . Bradycardia   . Palpitations     Transient PAT/AF, CHADS2 score 1  . Cataracts, bilateral     Social History [name] reports that she quit smoking about 3 years ago. Her smoking use included Cigarettes. She has never used smokeless tobacco. [name] reports that she drinks alcohol.  Review of Systems Still has palpitations, however not as significant. No syncope. Otherwise reviewed and negative except as outlined.  Physical Examination There were no vitals filed for this visit. Overweight woman in no acute distress. HEENT: Conjunctiva and lids normal, oropharynx with moist mucosa. Neck: Supple, no elevated JVP, no bruits, no thyromegaly. Lungs: Coarse breath sounds, otherwise clear, nonlabored. Cardiac: Regular rate and rhythm, no significant murmur, no S3. Abdomen: Soft, nontender pelvis is present. Extremities: Distal pulses one plus, no edema. Skin: Warm and dry, tattoo noted on back. Musculoskeletal: No kyphosis. Neuropsychiatric: Alert and oriented x3, affect appropriate.   Problem List and Plan

## 2011-01-31 NOTE — Assessment & Plan Note (Signed)
Blood pressure elevated. Medication changes were made at the last visit. Plan to advance Cozaar to 100 mg daily, followup BMET in 2 weeks.

## 2011-01-31 NOTE — Assessment & Plan Note (Signed)
LDL looks good, HDL is low. We discussed diet and exercise.

## 2011-01-31 NOTE — Patient Instructions (Signed)
Your physician wants you to follow-up in: 6 months. You will receive a reminder letter in the mail one-two months in advance. If you don't receive a letter, please call our office to schedule the follow-up appointment.  Increase Cozaar to 100mg  daily. You may take 2 of your 50mg  tablets until gone and then get new prescription filled for 100mg  tablets. Your physician recommends that you go to the [facility] for lab work: [employer].  DO LAB IN 2 WEEKS.

## 2011-01-31 NOTE — Assessment & Plan Note (Signed)
No active angina, continue present regimen.

## 2011-01-31 NOTE — Assessment & Plan Note (Signed)
Better, but not completely resolved, on beta blocker.

## 2011-03-03 ENCOUNTER — Encounter: Payer: Self-pay | Admitting: Cardiology

## 2011-03-07 ENCOUNTER — Other Ambulatory Visit: Payer: Self-pay | Admitting: *Deleted

## 2011-03-07 MED ORDER — CLOPIDOGREL BISULFATE 75 MG PO TABS: 75.0000 mg | ORAL_TABLET | Freq: Every day | ORAL | Status: DC

## 2011-03-07 MED ORDER — SIMVASTATIN 40 MG PO TABS: 40.0000 mg | ORAL_TABLET | Freq: Every day | ORAL | Status: DC

## 2011-03-13 ENCOUNTER — Encounter: Payer: Self-pay | Admitting: *Deleted

## 2011-05-06 ENCOUNTER — Other Ambulatory Visit: Payer: Self-pay | Admitting: Cardiology

## 2011-05-06 NOTE — Telephone Encounter (Signed)
Heather Livingston patient 

## 2011-05-06 NOTE — Telephone Encounter (Signed)
Heather Livingston 

## 2011-05-07 ENCOUNTER — Other Ambulatory Visit: Payer: Self-pay | Admitting: Physician Assistant

## 2011-05-12 ENCOUNTER — Other Ambulatory Visit: Payer: Self-pay | Admitting: *Deleted

## 2011-05-12 MED ORDER — ISOSORBIDE MONONITRATE ER 30 MG PO TB24: 30.0000 mg | ORAL_TABLET | Freq: Every day | ORAL | Status: DC

## 2011-06-02 ENCOUNTER — Encounter: Payer: Self-pay | Admitting: *Deleted

## 2011-06-02 NOTE — Progress Notes (Signed)
  Certified letter mailed to pt regarding BMET that was ordered to do done around May 4th but has not been completed yet.

## 2011-06-19 ENCOUNTER — Encounter: Payer: Self-pay | Admitting: Cardiology

## 2011-06-19 NOTE — Progress Notes (Signed)
  Certified letter response card signed by Suszanne Finch on 06/07/11 and returned to the office.

## 2011-07-24 NOTE — Progress Notes (Signed)
This encounter was created in error - please disregard.

## 2011-08-29 ENCOUNTER — Encounter: Payer: Self-pay | Admitting: Cardiology

## 2011-09-12 ENCOUNTER — Encounter: Payer: Self-pay | Admitting: Cardiology

## 2011-09-12 ENCOUNTER — Ambulatory Visit (INDEPENDENT_AMBULATORY_CARE_PROVIDER_SITE_OTHER): Payer: 59 | Admitting: Cardiology

## 2011-09-12 VITALS — BP 168/97 | HR 62 | Resp 16 | Ht 65.0 in | Wt 204.0 lb

## 2011-09-12 DIAGNOSIS — I2510 Atherosclerotic heart disease of native coronary artery without angina pectoris: Secondary | ICD-10-CM

## 2011-09-12 DIAGNOSIS — I498 Other specified cardiac arrhythmias: Secondary | ICD-10-CM

## 2011-09-12 DIAGNOSIS — I10 Essential (primary) hypertension: Secondary | ICD-10-CM

## 2011-09-12 DIAGNOSIS — E785 Hyperlipidemia, unspecified: Secondary | ICD-10-CM

## 2011-09-12 MED ORDER — HYDRALAZINE HCL 50 MG PO TABS: ORAL_TABLET | ORAL | Status: DC

## 2011-09-12 NOTE — Assessment & Plan Note (Signed)
Symptomatically stable on medical therapy. ECG reviewed. We discussed diet and options for exercise she can hopefully tolerate.

## 2011-09-12 NOTE — Assessment & Plan Note (Signed)
Blood pressure remains elevated. Increase hydralazine to 75 mg p.o. T.i.d.

## 2011-09-12 NOTE — Patient Instructions (Signed)
   Increase Hydralazine to 75mg  three times per day Your physician wants you to follow up in: 6 months.  You will receive a reminder letter in the mail one-two months in advance.  If you don't receive a letter, please call our office to schedule the follow up appointment

## 2011-09-12 NOTE — Progress Notes (Signed)
Clinical Summary Ms. Rathmann is a 60 y.o.female presenting for followup. She was seen back in April. She reports no angina, occasional palpitations. No dizziness or syncope.   She has been frustrated about not being able to lose weight, although admits that she is functionally limited by hip pain. She still tries to do some walking, but has to stop frequently. We discussed other options such as water aerobics or stationary bicycle.  Lab work from September showed BUN 13, creatinine 0.7, potassium 4.3. She reports compliance with her and hypertensive. Blood pressure is still elevated. Lipids have been followed by Dr. Melvyn Neth.   No Known Allergies  Medication list reviewed.  Past Medical History  Diagnosis Date  . Coronary atherosclerosis of native coronary artery     DES circ 10/10, residual 100% occluded SVG to OM, patent LIMA to LAD and SVG to PDA grafts, LVEF 50%  . Hyperlipemia   . Hypertension   . Bradycardia   . Palpitations     Transient PAT/AF, CHADS2 score 1  . Cataracts, bilateral     Past Surgical History  Procedure Date  . Abdominal hysterectomy   . Carpal tunnel release   . Coronary artery bypass graft     [facility], LIMA to LAD, SVG to OM, SVG to PDA    Family History  Problem Relation Age of Onset  . Heart attack Father   . Hypertension Brother     Social History Ms. Primiano reports that she quit smoking about 3 years ago. Her smoking use included Cigarettes. She has never used smokeless tobacco. Ms. Titzer reports that she drinks alcohol.  Review of Systems As outlined above. Otherwise negative.  Physical Examination Filed Vitals:   09/12/11 0829  BP: 168/97  Pulse: 62  Resp: 16    Overweight woman in no acute distress.  HEENT: Conjunctiva and lids normal, oropharynx with moist mucosa.  Neck: Supple, no elevated JVP, no bruits, no thyromegaly.  Lungs: Coarse breath sounds, otherwise clear, nonlabored.  Cardiac: Regular rate and rhythm, no  significant murmur, no S3.  Abdomen: Soft, nontender pelvis is present.  Extremities: Distal pulses one plus, no edema.  Skin: Warm and dry, tattoo noted on back.  Musculoskeletal: No kyphosis.  Neuropsychiatric: Alert and oriented x3, affect appropriate.  ECG Normal sinus rhythm with evidence of previous inferior infarct, T wave inversions anterolaterally and inferiorly as noted previously.   Problem List and Plan

## 2011-09-12 NOTE — Assessment & Plan Note (Signed)
Occasional palpitations, but no sustained arrhythmias on medical therapy.

## 2011-09-12 NOTE — Assessment & Plan Note (Signed)
Followed by Dr. Melvyn Neth. Will request most recent lab work for review.

## 2011-09-23 ENCOUNTER — Other Ambulatory Visit: Payer: Self-pay | Admitting: Cardiology

## 2011-10-10 ENCOUNTER — Other Ambulatory Visit: Payer: Self-pay | Admitting: Cardiology

## 2011-10-17 ENCOUNTER — Other Ambulatory Visit: Payer: Self-pay | Admitting: *Deleted

## 2011-10-17 MED ORDER — SIMVASTATIN 40 MG PO TABS: 40.0000 mg | ORAL_TABLET | Freq: Every day | ORAL | Status: DC

## 2011-11-06 ENCOUNTER — Other Ambulatory Visit: Payer: Self-pay | Admitting: Cardiology

## 2012-03-01 ENCOUNTER — Other Ambulatory Visit: Payer: Self-pay | Admitting: Cardiology

## 2012-03-01 ENCOUNTER — Other Ambulatory Visit: Payer: Self-pay | Admitting: Physician Assistant

## 2012-05-19 ENCOUNTER — Telehealth: Payer: Self-pay | Admitting: Cardiology

## 2012-05-19 ENCOUNTER — Other Ambulatory Visit: Payer: Self-pay | Admitting: Cardiology

## 2012-05-19 DIAGNOSIS — I2510 Atherosclerotic heart disease of native coronary artery without angina pectoris: Secondary | ICD-10-CM

## 2012-05-19 DIAGNOSIS — E7800 Pure hypercholesterolemia, unspecified: Secondary | ICD-10-CM

## 2012-05-19 NOTE — Telephone Encounter (Signed)
Why don't I see her first and we can decide whether we need a stress test at that point. Regarding labs, please make sure that we have her most recent labs from primary care provider. If she has not had lipids since our last visit, she will need them as well.

## 2012-05-19 NOTE — Telephone Encounter (Signed)
Spoke with Mrs. Heather Livingston about Dr. Ival Bible recommendations. Wrote order for labs and mailed to patient.

## 2012-05-19 NOTE — Telephone Encounter (Signed)
Heather Livingston called today to set up her appointment on June 17, 2012 with Dr.McDowell. She stated that on her last exam Dr. Diona Browner mentioned having a "stress test" before her next Office exam. I do not see any documentation in her office note. Will check with Dr.Mcdowell regarding This and also does she need labs before being seen.

## 2012-06-09 ENCOUNTER — Telehealth: Payer: Self-pay | Admitting: *Deleted

## 2012-06-09 NOTE — Telephone Encounter (Signed)
Message copied by Eustace Moore on Wed Jun 09, 2012  4:38 PM ------      Message from: MCDOWELL, Illene Bolus      Created: Wed Jun 09, 2012  9:18 AM       LFTs normal and LDL at goal at 75.

## 2012-06-09 NOTE — Telephone Encounter (Signed)
Left message for patient to call office.  

## 2012-06-10 NOTE — Telephone Encounter (Signed)
Patient informed. 

## 2012-06-17 ENCOUNTER — Ambulatory Visit (INDEPENDENT_AMBULATORY_CARE_PROVIDER_SITE_OTHER): Payer: 59 | Admitting: Cardiology

## 2012-06-17 ENCOUNTER — Encounter: Payer: Self-pay | Admitting: Cardiology

## 2012-06-17 ENCOUNTER — Encounter: Payer: Self-pay | Admitting: *Deleted

## 2012-06-17 VITALS — BP 135/64 | HR 59 | Ht 65.0 in | Wt 213.0 lb

## 2012-06-17 DIAGNOSIS — E785 Hyperlipidemia, unspecified: Secondary | ICD-10-CM

## 2012-06-17 DIAGNOSIS — R079 Chest pain, unspecified: Secondary | ICD-10-CM

## 2012-06-17 DIAGNOSIS — I10 Essential (primary) hypertension: Secondary | ICD-10-CM

## 2012-06-17 DIAGNOSIS — I2510 Atherosclerotic heart disease of native coronary artery without angina pectoris: Secondary | ICD-10-CM

## 2012-06-17 MED ORDER — BUPROPION HCL 100 MG PO TABS: ORAL_TABLET | ORAL | Status: DC

## 2012-06-17 NOTE — Assessment & Plan Note (Signed)
LDL at goal on statin therapy. 

## 2012-06-17 NOTE — Progress Notes (Signed)
Clinical Summary Heather Livingston is a 61 y.o.female presenting for followup. She was seen in November 2012. She reports functional limitation related to arthritic pain, back pain. She is using a cane. No regular exercise at this point. She does report exertional neck discomfort, likely anginal, has been present for several months. No rest symptoms.  Recent lab work shows normal LFTs, cholesterol 138, triglycerides 162, HDL 31, LDL 75. We reviewed this today. She reports compliance with her medications.  Cardiac history reviewed. She has not had followup stress testing since her intervention in 2010. ECG reviewed in EMR. Anterolateral T wave inversions noted.   No Known Allergies  Current Outpatient Prescriptions  Medication Sig Dispense Refill  . aspirin 325 MG tablet Take 325 mg by mouth daily.        . clopidogrel (PLAVIX) 75 MG tablet TAKE ONE TABLET BY MOUTH DAILY  30 tablet  6  . diphenhydrAMINE (SOMINEX) 25 MG tablet Take 25 mg by mouth at bedtime as needed.        . hydrALAZINE (APRESOLINE) 50 MG tablet Take 1 1/2 tabs (75mg ) three times per day   135 tablet  6  . hydrochlorothiazide 25 MG tablet Take 25 mg by mouth daily.        . isosorbide mononitrate (IMDUR) 30 MG 24 hr tablet TAKE 1 TABLET BY MOUTH ONCE DAILY  30 tablet  6  . losartan (COZAAR) 100 MG tablet TAKE 1 TABLET BY MOUTH DAILY  90 tablet  3  . metoprolol (LOPRESSOR) 50 MG tablet TAKE 1 TABLET BY MOUTH TWICE DAILY  60 tablet  6  . nitroGLYCERIN (NITROSTAT) 0.4 MG SL tablet Place 0.4 mg under the tongue every 5 (five) minutes as needed.        . simvastatin (ZOCOR) 40 MG tablet Take 1 tablet (40 mg total) by mouth at bedtime.  30 tablet  6  . buPROPion (WELLBUTRIN) 100 MG tablet Take one tab daily as needed.        Past Medical History  Diagnosis Date  . Coronary atherosclerosis of native coronary artery     DES circ 10/10, residual 100% occluded SVG to OM, patent LIMA to LAD and SVG to PDA grafts, LVEF 50%  .  Hyperlipemia   . Hypertension   . Bradycardia   . Palpitations     Transient PAT/AF, CHADS2 score 1  . Cataracts, bilateral     Social History Ms. Cartelli reports that she quit smoking about 4 years ago. Her smoking use included Cigarettes. She has never used smokeless tobacco. Ms. Frison reports that she drinks alcohol.  Review of Systems Occasional palpitations, none prolonged. No reported bleeding episodes. No syncope. Otherwise negative.  Physical Examination Filed Vitals:   06/17/12 1434  BP: 135/64  Pulse: 59   Overweight woman in no acute distress.  HEENT: Conjunctiva and lids normal, oropharynx with moist mucosa.  Neck: Supple, no elevated JVP, no bruits, no thyromegaly.  Lungs: Coarse breath sounds, otherwise clear, nonlabored.  Cardiac: Regular rate and rhythm, no significant murmur, no S3.  Abdomen: Soft, nontender pelvis is present.  Extremities: Distal pulses one plus, no edema.  Skin: Warm and dry, tattoo noted on back.  Musculoskeletal: No kyphosis.  Neuropsychiatric: Alert and oriented x3, affect appropriate.    Problem List and Plan   CORONARY ATHEROSCLEROSIS NATIVE CORONARY ARTERY Continue medical therapy. She is reporting exertional symptoms that are likely anginal. A followup Lexiscan Myoview will be arranged to assess ischemic burden and determine  whether we can continue medical therapy versus need for further invasive evaluation.  DYSLIPIDEMIA LDL at goal on statin therapy.  ESSENTIAL HYPERTENSION, BENIGN Blood pressure control is better.    Jonelle Sidle, M.D., F.A.C.C.

## 2012-06-17 NOTE — Assessment & Plan Note (Signed)
Blood pressure control is better.

## 2012-06-17 NOTE — Assessment & Plan Note (Signed)
Continue medical therapy. She is reporting exertional symptoms that are likely anginal. A followup Lexiscan Myoview will be arranged to assess ischemic burden and determine whether we can continue medical therapy versus need for further invasive evaluation.

## 2012-06-17 NOTE — Patient Instructions (Addendum)
Your physician recommends that you schedule a follow-up appointment in: 6 months. You will receive a reminder letter in the mail in about 4 months reminding you to call and schedule your appointment. If you don't receive this letter, please contact our office. Your physician has requested that you have a lexiscan myoview. For further information please visit [email or web address] Please follow instruction sheet, as given. Your physician recommends that you return for a FASTING lipid/liver profile: in 6 months just before your next visit. You will receive a reminder letter in the mail in about 4 months reminding you to have this done at [facility]. If you don't receive this letter, please contact our office.

## 2012-06-21 ENCOUNTER — Other Ambulatory Visit: Payer: Self-pay | Admitting: Cardiology

## 2012-06-21 ENCOUNTER — Telehealth: Payer: Self-pay

## 2012-06-21 DIAGNOSIS — I2510 Atherosclerotic heart disease of native coronary artery without angina pectoris: Secondary | ICD-10-CM

## 2012-06-21 DIAGNOSIS — R079 Chest pain, unspecified: Secondary | ICD-10-CM

## 2012-06-21 NOTE — Telephone Encounter (Signed)
No precert required 

## 2012-06-21 NOTE — Telephone Encounter (Signed)
Lexiscan myoview Weight 213 Diagnosis: 414.01, 786.51 Wednesday, 06-23-2012 [facility]

## 2012-06-23 DIAGNOSIS — I2510 Atherosclerotic heart disease of native coronary artery without angina pectoris: Secondary | ICD-10-CM

## 2012-07-01 ENCOUNTER — Telehealth: Payer: Self-pay | Admitting: *Deleted

## 2012-07-01 MED ORDER — ISOSORBIDE MONONITRATE ER 60 MG PO TB24: 60.0000 mg | ORAL_TABLET | Freq: Every day | ORAL | Status: DC

## 2012-07-01 NOTE — Telephone Encounter (Signed)
Message copied by Eustace Moore on Thu Jul 01, 2012  2:58 PM ------      Message from: MCDOWELL, Illene Bolus      Created: Wed Jun 23, 2012  4:29 PM       Reviewed study. This is consistent with known CAD, LV function normal at 57%, some evidence of ischemia in the inferolateral wall, overall mild. Perhaps we can try to increase her Imdur to 60 mg daily to see if this helps with angina symptoms. If not, we can always discuss a cardiac catheterization at some point.

## 2012-07-01 NOTE — Telephone Encounter (Signed)
Patient informed and verbalized understanding

## 2012-07-06 ENCOUNTER — Other Ambulatory Visit: Payer: Self-pay | Admitting: Cardiology

## 2012-07-21 ENCOUNTER — Other Ambulatory Visit: Payer: Self-pay | Admitting: Cardiology

## 2012-10-01 ENCOUNTER — Other Ambulatory Visit: Payer: Self-pay | Admitting: Cardiology

## 2012-12-07 ENCOUNTER — Other Ambulatory Visit: Payer: Self-pay | Admitting: Cardiology

## 2012-12-07 MED ORDER — LOSARTAN POTASSIUM 100 MG PO TABS: 100.0000 mg | ORAL_TABLET | Freq: Every day | ORAL | Status: DC

## 2012-12-24 ENCOUNTER — Inpatient Hospital Stay (HOSPITAL_COMMUNITY)
Admission: AD | Admit: 2012-12-24 | Discharge: 2012-12-28 | DRG: 287 | Disposition: A | Discharge status: Home / Self Care | Payer: Medicare Other | Source: Other Acute Inpatient Hospital | Attending: Internal Medicine | Admitting: Internal Medicine

## 2012-12-24 DIAGNOSIS — R002 Palpitations: Secondary | ICD-10-CM | POA: Diagnosis present

## 2012-12-24 DIAGNOSIS — I2510 Atherosclerotic heart disease of native coronary artery without angina pectoris: Principal | ICD-10-CM | POA: Diagnosis present

## 2012-12-24 DIAGNOSIS — Z9861 Coronary angioplasty status: Secondary | ICD-10-CM

## 2012-12-24 DIAGNOSIS — I10 Essential (primary) hypertension: Secondary | ICD-10-CM | POA: Diagnosis present

## 2012-12-24 DIAGNOSIS — Z87891 Personal history of nicotine dependence: Secondary | ICD-10-CM

## 2012-12-24 DIAGNOSIS — I209 Angina pectoris, unspecified: Secondary | ICD-10-CM

## 2012-12-24 DIAGNOSIS — I2582 Chronic total occlusion of coronary artery: Secondary | ICD-10-CM | POA: Diagnosis present

## 2012-12-24 DIAGNOSIS — I25810 Atherosclerosis of coronary artery bypass graft(s) without angina pectoris: Secondary | ICD-10-CM | POA: Diagnosis present

## 2012-12-24 DIAGNOSIS — Z7902 Long term (current) use of antithrombotics/antiplatelets: Secondary | ICD-10-CM

## 2012-12-24 DIAGNOSIS — E785 Hyperlipidemia, unspecified: Secondary | ICD-10-CM | POA: Diagnosis present

## 2012-12-24 DIAGNOSIS — Z79899 Other long term (current) drug therapy: Secondary | ICD-10-CM

## 2012-12-24 DIAGNOSIS — I498 Other specified cardiac arrhythmias: Secondary | ICD-10-CM | POA: Diagnosis present

## 2012-12-24 DIAGNOSIS — Z7982 Long term (current) use of aspirin: Secondary | ICD-10-CM

## 2012-12-24 DIAGNOSIS — I200 Unstable angina: Secondary | ICD-10-CM | POA: Diagnosis present

## 2012-12-24 HISTORY — DX: Gastro-esophageal reflux disease without esophagitis: K21.9

## 2012-12-24 NOTE — H&P (Signed)
Cardiology History and Physical  LEWIS,WILLIAM B, MD  History of Present Illness (and review of medical records): Heather Livingston is a 62 y.o. female who presents for evaluation of chest pain.  She has known CAD s/p CABG.  She had been having anginal equivalent over past few months which has been medically management.  She had lexiscan performed in 06/2012.  She however returns with angina getting progressively worse.  She describes left sided chest pain, 8-9/10 radiating to back along with shorntess of breath, nausea and palpitations.  She also neck discomfort feels like she is choking.  Symptoms came on severe today around 4pm.  She was seen at [facility] and given NTG, Morphine, and placed on heparin and nitro gtts prior to transfer.  Review of Systems Further review of systems was otherwise negative other than stated in HPI.  Patient Active Problem List   Diagnosis Date Noted  . ESSENTIAL HYPERTENSION, BENIGN 10/29/2010  . CORONARY ATHEROSCLEROSIS NATIVE CORONARY ARTERY 10/29/2010  . COUGH 10/29/2010  . SUPRAVENTRICULAR TACHYCARDIA 09/13/2010  . DYSLIPIDEMIA 08/27/2010  . 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 PALPITATIONS 08/27/2010   Past Medical History  Diagnosis Date  . Coronary atherosclerosis of native coronary artery     DES circ 10/10, residual 100% occluded SVG to OM, patent LIMA to LAD and SVG to PDA grafts, LVEF 50%  . Hyperlipemia   . Hypertension   . Bradycardia   . Palpitations     Transient PAT/AF, CHADS2 score 1  . Cataracts, bilateral     Past Surgical History  Procedure Laterality Date  . Abdominal hysterectomy    . Carpal tunnel release    . Coronary artery bypass graft      [facility], LIMA to LAD, SVG to OM, SVG to PDA    Prescriptions prior to admission  Medication Sig Dispense Refill  . acetaminophen (TYLENOL) 650 MG CR tablet Take 650 mg by mouth every 8 (eight) hours as needed for pain.      Marland Kitchen aspirin 325 MG tablet Take 325 mg by mouth daily.        Marland Kitchen  buPROPion (WELLBUTRIN) 100 MG tablet Take 100 mg by mouth daily as needed (for  severe anxiety).       . clopidogrel (PLAVIX) 75 MG tablet Take 75 mg by mouth daily.      . diphenhydrAMINE (SOMINEX) 25 MG tablet Take 25 mg by mouth 3 times/day as needed-between meals & bedtime for itching, allergies or sleep.       . hydrALAZINE (APRESOLINE) 50 MG tablet Take 75 mg by mouth 3 (three) times daily.      . isosorbide mononitrate (IMDUR) 60 MG 24 hr tablet Take 1 tablet (60 mg total) by mouth daily.  30 tablet  6  . losartan (COZAAR) 100 MG tablet Take 1 tablet (100 mg total) by mouth daily.  90 tablet  0  . metoprolol (LOPRESSOR) 50 MG tablet Take 50 mg by mouth 2 (two) times daily.      . nitroGLYCERIN (NITROSTAT) 0.4 MG SL tablet Place 0.4 mg under the tongue every 5 (five) minutes as needed for chest pain.       . simvastatin (ZOCOR) 40 MG tablet Take 40 mg by mouth at bedtime.      . [DISCONTINUED] buPROPion (WELLBUTRIN) 100 MG tablet Take one tab daily as needed.  30 tablet  3   No Known Allergies  History  Substance Use Topics  . Smoking status: Former Smoker  Types: Cigarettes    Quit date: 10/14/2007  . Smokeless tobacco: Never Used  . Alcohol Use: Yes     Comment: Occasional wine or beer    Family History  Problem Relation Age of Onset  . Heart attack Father   . Hypertension Brother      Objective: Patient Vitals for the past 8 hrs:  BP Temp Temp src Pulse SpO2  12/25/12 0511 108/62 mmHg 98 F (36.7 C) Oral 55 94 %  12/25/12 0125 145/72 mmHg - - 66 -   General Appearance:    Alert, cooperative, no distress, obese female  Head:    Normocephalic, without obvious abnormality, atraumatic  Eyes:     PERRL, EOMI, anicteric sclerae  Neck:   Supple, no carotid bruit or JVD  Lungs:     Clear to auscultation bilaterally, respirations unlabored  Heart:    Regular rate and rhythm, S1 and S2 normal, no murmur  Abdomen:     Soft, non-tender, normoactive bowel sounds  Extremities:    Extremities normal, atraumatic, no cyanosis or edema  Pulses:   2+ and symmetric all extremities  Skin:   no rashes or lesions, healed midsternotomy wound  Neurologic:   No focal deficits. AAO x3   Results for orders placed during the hospital encounter of 12/24/12 (from the past 48 hour(s))  TROPONIN I     Status: None   Collection Time    12/25/12 12:28 AM      Result Value Range   Troponin I <0.30  <0.30 ng/mL   Comment:            Due to the release kinetics of cTnI,     a negative result within the first hours     of the onset of symptoms does not rule out     myocardial infarction with certainty.     If myocardial infarction is still suspected,     repeat the test at appropriate intervals.  CBC     Status: None   Collection Time    12/25/12  5:40 AM      Result Value Range   WBC 5.2  4.0 - 10.5 K/uL   RBC 4.57  3.87 - 5.11 MIL/uL   Hemoglobin 14.5  12.0 - 15.0 g/dL   HCT 16.1  09.6 - 04.5 %   MCV 94.5  78.0 - 100.0 fL   MCH 31.7  26.0 - 34.0 pg   MCHC 33.6  30.0 - 36.0 g/dL   RDW 40.9  81.1 - 91.4 %   Platelets 226  150 - 400 K/uL   No results found.  ECG:  Sinus rhythm, probably old inferior infarct previously cited, TWA lateral leads similar to most recent  Lexiscan Myoview 06/2012: consistent with known CAD, LV function normal at 57%, some evidence of ischemia in the inferolateral wall, overall mild  Cath 2011: Left ventriculography shows inferobasal akinesis with a left ventricular ejection fraction estimated at 50%.  The other LV wall segments are normal. NATIVE CORONARY ANGIOGRAPHY:  The right coronary artery is totally occluded in the proximal aspect. Left mainstem:  There is diffuse calcium in the left main with 40% distal stenosis. LAD:  The LAD is totally occluded at the ostium.  The vessel fills entirely from the LIMA graft. Ramus intermedius is a large branch.  There is no significant stenosis. There are minor luminal irregularities. Left circumflex:   The left circumflex is a tortuous vessel.  It has a 90% proximal  lesion and a 95% mid lesion.  There is retrograde filling of the occluded vein graft, which was anastomosed to the marginal branch.  BYPASS GRAFT ANGIOGRAPHY:  The left internal mammary to LAD is widely patent.  There is no significant stenosis in the LAD, which back-fills to the proximal vessel.  Saphenous vein graft to the PDA is patent.  This also retrograde fills the posterolateral branch of the vessel. Saphenous vein graft to OM is occluded at the aortic anastomotic site.  FINAL ASSESSMENT: 1. Severe native 3-vessel coronary artery disease. 2. Successful percutaneous intervention of the native left circumflex     using 2 separate drug-eluting stents. 3. Patent left internal mammary artery to left anterior descending and     patent saphenous vein graft to posterior descending artery. 4. Mild segmental left ventricular systolic dysfunction.  Assessment: 71F hx of CAD s/p CABG, last had PCI to LCx 2011, presents with recurrent angina after attempts to optimize medical management.  Plan: 1. Admit to Cardiology 2. Continuous monitoring on Telemetry. 3. Repeat ekg on admit, prn chest pain or arrythmia 4. Trend cardiac biomarkers, check lipids, hgba1c, tsh 5. Medical management to include ASA, Plavix, Heparin gtt, BB, Statin, NTG prn 6. Will like need further evaluation with cardiac catheterization.

## 2012-12-25 LAB — MAGNESIUM: Magnesium: 2 mg/dL (ref 1.5–2.5)

## 2012-12-25 LAB — BASIC METABOLIC PANEL
BUN: 18 mg/dL (ref 6–23)
Calcium: 9.3 mg/dL (ref 8.4–10.5)
Creatinine, Ser: 0.9 mg/dL (ref 0.50–1.10)
GFR calc Af Amer: 78 mL/min — ABNORMAL LOW (ref >90)

## 2012-12-25 LAB — TROPONIN I: Troponin I: <0.3 ng/mL (ref <0.30)

## 2012-12-25 LAB — CBC
Hemoglobin: 14.5 g/dL (ref 12.0–15.0)
MCH: 31.7 pg (ref 26.0–34.0)
MCHC: 33.6 g/dL (ref 30.0–36.0)
MCV: 94.5 fL (ref 78.0–100.0)
Platelets: 226 10*3/uL (ref 150–400)

## 2012-12-25 LAB — PROTIME-INR: Prothrombin Time: 12.9 seconds (ref 11.6–15.2)

## 2012-12-25 LAB — LIPID PANEL
Cholesterol: 110 mg/dL (ref 0–200)
VLDL: 31 mg/dL (ref 0–40)

## 2012-12-25 LAB — HEMOGLOBIN A1C
Hgb A1c MFr Bld: 5.5 % (ref <5.7)
Mean Plasma Glucose: 111 mg/dL (ref <117)

## 2012-12-25 LAB — HEPARIN LEVEL (UNFRACTIONATED): Heparin Unfractionated: <0.1 IU/mL — ABNORMAL LOW (ref 0.30–0.70)

## 2012-12-25 LAB — TSH: TSH: 5.571 u[IU]/mL — ABNORMAL HIGH (ref 0.350–4.500)

## 2012-12-25 MED ORDER — ASPIRIN 81 MG PO CHEW
324.0000 mg | CHEWABLE_TABLET | ORAL | Status: AC
  Administered 2012-12-27: 324 mg via ORAL
  Filled 2012-12-25: qty 4

## 2012-12-25 MED ORDER — BUPROPION HCL 100 MG PO TABS
100.0000 mg | ORAL_TABLET | Freq: Every day | ORAL | Status: DC | PRN
  Filled 2012-12-25: qty 1

## 2012-12-25 MED ORDER — HEPARIN SODIUM (PORCINE) 5000 UNIT/ML IJ SOLN: 5000.0000 [IU] | Freq: Three times a day (TID) | INTRAMUSCULAR | Status: DC

## 2012-12-25 MED ORDER — DIAZEPAM 5 MG PO TABS: 5.0000 mg | ORAL_TABLET | ORAL | Status: AC

## 2012-12-25 MED ORDER — HEPARIN (PORCINE) IN NACL 100-0.45 UNIT/ML-% IJ SOLN
1600.0000 [IU]/h | INTRAMUSCULAR | Status: DC
  Administered 2012-12-25: 1100 [IU]/h via INTRAVENOUS
  Administered 2012-12-26 – 2012-12-27 (×2): 1600 [IU]/h via INTRAVENOUS
  Filled 2012-12-25 (×6): qty 250

## 2012-12-25 MED ORDER — CLOPIDOGREL BISULFATE 75 MG PO TABS
75.0000 mg | ORAL_TABLET | Freq: Every day | ORAL | Status: DC
  Administered 2012-12-25 – 2012-12-28 (×4): 75 mg via ORAL
  Filled 2012-12-25 (×3): qty 1

## 2012-12-25 MED ORDER — TRAMADOL HCL 50 MG PO TABS
50.0000 mg | ORAL_TABLET | Freq: Two times a day (BID) | ORAL | Status: DC | PRN
  Administered 2012-12-26 – 2012-12-28 (×3): 50 mg via ORAL
  Filled 2012-12-25 (×3): qty 1

## 2012-12-25 MED ORDER — NITROGLYCERIN 0.4 MG SL SUBL: 0.4000 mg | SUBLINGUAL_TABLET | SUBLINGUAL | Status: DC | PRN

## 2012-12-25 MED ORDER — HEPARIN BOLUS VIA INFUSION
2000.0000 [IU] | Freq: Once | INTRAVENOUS | Status: AC
  Administered 2012-12-25: 2000 [IU] via INTRAVENOUS
  Filled 2012-12-25: qty 2000

## 2012-12-25 MED ORDER — LOSARTAN POTASSIUM 50 MG PO TABS
100.0000 mg | ORAL_TABLET | Freq: Every day | ORAL | Status: DC
  Administered 2012-12-25 – 2012-12-28 (×4): 100 mg via ORAL
  Filled 2012-12-25 (×4): qty 2

## 2012-12-25 MED ORDER — DIPHENHYDRAMINE HCL (SLEEP) 25 MG PO TABS: 25.0000 mg | ORAL_TABLET | Freq: Two times a day (BID) | ORAL | Status: DC | PRN

## 2012-12-25 MED ORDER — SODIUM CHLORIDE 0.9 % IV SOLN: 1.0000 mL/kg/h | INTRAVENOUS | Status: DC

## 2012-12-25 MED ORDER — ACETAMINOPHEN 325 MG PO TABS
650.0000 mg | ORAL_TABLET | ORAL | Status: DC | PRN
  Administered 2012-12-25 (×2): 650 mg via ORAL
  Filled 2012-12-25 (×2): qty 2

## 2012-12-25 MED ORDER — ISOSORBIDE MONONITRATE ER 60 MG PO TB24
60.0000 mg | ORAL_TABLET | Freq: Every day | ORAL | Status: DC
  Administered 2012-12-25 – 2012-12-28 (×4): 60 mg via ORAL
  Filled 2012-12-25 (×4): qty 1

## 2012-12-25 MED ORDER — ONDANSETRON HCL 4 MG/2ML IJ SOLN: 4.0000 mg | Freq: Four times a day (QID) | INTRAMUSCULAR | Status: DC | PRN

## 2012-12-25 MED ORDER — SIMVASTATIN 40 MG PO TABS
40.0000 mg | ORAL_TABLET | Freq: Every day | ORAL | Status: DC
  Administered 2012-12-25 – 2012-12-27 (×4): 40 mg via ORAL
  Filled 2012-12-25 (×5): qty 1

## 2012-12-25 MED ORDER — DIPHENHYDRAMINE HCL 25 MG PO CAPS
25.0000 mg | ORAL_CAPSULE | Freq: Two times a day (BID) | ORAL | Status: DC | PRN
  Filled 2012-12-25: qty 1

## 2012-12-25 MED ORDER — METOPROLOL TARTRATE 50 MG PO TABS
50.0000 mg | ORAL_TABLET | Freq: Two times a day (BID) | ORAL | Status: DC
  Administered 2012-12-25 – 2012-12-28 (×4): 50 mg via ORAL
  Filled 2012-12-25 (×6): qty 1

## 2012-12-25 MED ORDER — ASPIRIN EC 325 MG PO TBEC
325.0000 mg | DELAYED_RELEASE_TABLET | Freq: Every day | ORAL | Status: DC
  Administered 2012-12-25 – 2012-12-28 (×3): 325 mg via ORAL
  Filled 2012-12-25 (×3): qty 1

## 2012-12-25 MED ORDER — HYDRALAZINE HCL 50 MG PO TABS
75.0000 mg | ORAL_TABLET | Freq: Three times a day (TID) | ORAL | Status: DC
  Administered 2012-12-25 – 2012-12-28 (×10): 75 mg via ORAL
  Filled 2012-12-25 (×12): qty 1

## 2012-12-25 MED ORDER — METOPROLOL TARTRATE 50 MG PO TABS
50.0000 mg | ORAL_TABLET | Freq: Two times a day (BID) | ORAL | Status: DC
  Administered 2012-12-25 (×2): 50 mg via ORAL
  Filled 2012-12-25 (×3): qty 1

## 2012-12-25 NOTE — Progress Notes (Signed)
ANTICOAGULATION CONSULT NOTE - Follow up Consult  Pharmacy Consult for heparin Indication: chest pain/ACS  No Known Allergies  Patient Measurements: Heparin Dosing Weight: 80kg   Medical History: Past Medical History  Diagnosis Date  . Coronary atherosclerosis of native coronary artery     DES circ 10/10, residual 100% occluded SVG to OM, patent LIMA to LAD and SVG to PDA grafts, LVEF 50%  . Hyperlipemia   . Hypertension   . Bradycardia   . Palpitations     Transient PAT/AF, CHADS2 score 1  . Cataracts, bilateral      Assessment: 62yo female with CP transferred from [facility] for cardiac evaluation with Hx CAD/CABG. Heparin level remains subtherapeutic.  RN reports heparin was just resumed about 20 min ago after bag ran out.  I'm uncertain how long heparin was off and timing of level draw.  Goal of Therapy:  Heparin level 0.3-0.7 units/ml Monitor platelets by anticoagulation protocol: Yes   Plan:  Cont Heparin 1300 uts/hr Recheck HL in 8hr    [employer].D Clinical Pharmacist [phone] 12/25/2012 8:08 PM

## 2012-12-25 NOTE — Progress Notes (Signed)
ANTICOAGULATION CONSULT NOTE - Follow up Consult  Pharmacy Consult for heparin Indication: chest pain/ACS  No Known Allergies  Patient Measurements: Heparin Dosing Weight: 80kg   Medical History: Past Medical History  Diagnosis Date  . Coronary atherosclerosis of native coronary artery     DES circ 10/10, residual 100% occluded SVG to OM, patent LIMA to LAD and SVG to PDA grafts, LVEF 50%  . Hyperlipemia   . Hypertension   . Bradycardia   . Palpitations     Transient PAT/AF, CHADS2 score 1  . Cataracts, bilateral      Assessment: 62yo female with CP transferred from [facility] for cardiac evaluation with Hx CAD/CABG. Tp have been negative.  Heparin drip1100 uts/ml HL < 0.1, cbc stable, no bleeding noted.  Goal of Therapy:  Heparin level 0.3-0.7 units/ml Monitor platelets by anticoagulation protocol: Yes   Plan:  Increase Heparin 1300 uts/hr Heparin bolus 2000 uts x1 Recheck HL in 6hr  And daily   Leota Sauers Pharm.D. CPP, BCPS Clinical Pharmacist [phone] 12/25/2012 12:23 PM

## 2012-12-25 NOTE — Progress Notes (Signed)
  Patient Name: Heather Livingston      SUBJECTIVE: 63 year old woman admitted 3/14 for chest pain in the context of prior bypass surgery and post bypass stenting as recently 2011 She has been having chest pain over recent months. Myoview scan 9/13 was apparently negative.  She has been having 2 distinct syndromes. One is frequently associated with palpitations. She had these on a daily or every other day basis. Her past medical history describes PAf  will try and clarify that.  The episode yesterday was distinct. It was crescendo over 10-15 minutes and in her mind was reminiscent of her pre-intervention discomforts in the past. t She has had some in his neck and Chest discomfort also associated with exertion relieved by rest  Past Medical History  Diagnosis Date  . Coronary atherosclerosis of native coronary artery     DES circ 10/10, residual 100% occluded SVG to OM, patent LIMA to LAD and SVG to PDA grafts, LVEF 50%  . Hyperlipemia   . Hypertension   . Bradycardia   . Palpitations     Transient PAT/AF, CHADS2 score 1  . Cataracts, bilateral     PHYSICAL EXAM Filed Vitals:   12/24/12 2125 12/25/12 0125 12/25/12 0511 12/25/12 1045  BP: 145/72 145/72 108/62 137/65  Pulse: 59 66 55 67  Temp: 97.6 F (36.4 C)  98 F (36.7 C)   TempSrc: Oral  Oral   Height: 5' 5.5" (1.664 m)     Weight: 220 lb 9.6 oz (100.064 kg)     SpO2: 96%  94%     Well developed and nourished in no acute distress HENT normal Neck supple with JVP-flat Clear Regular rate and rhythm, no murmurs or gallops Abd-soft with active BS No Clubbing cyanosis edema Skin-warm and dry A & Oriented  Grossly normal sensory and motor function  TELEMETRY: Reviewed telemetry pt in NSR:    Intake/Output Summary (Last 24 hours) at 12/25/12 1242 Last data filed at 12/25/12 0900  Gross per 24 hour  Intake      0 ml  Output      0 ml  Net      0 ml    LABS: Basic Metabolic Panel:  Recent Labs Lab  12/25/12 0540  NA 139  K 4.3  CL 103  CO2 26  GLUCOSE 87  BUN 18  CREATININE 0.90  CALCIUM 9.3  MG 2.0   Cardiac Enzymes:  Recent Labs  12/25/12 0028 12/25/12 0530  TROPONINI <0.30 <0.30   CBC:  Recent Labs Lab 12/25/12 0540  WBC 5.2  HGB 14.5  HCT 43.2  MCV 94.5  PLT 226   PROTIME:  Recent Labs  12/25/12 0540  LABPROT 12.9  INR 0.98    Recent Labs  12/25/12 0540  CHOL 110  HDL 25*  LDLCALC 54  TRIG 161*  CHOLHDL 4.4    ASSESSMENT AND PLAN:  Active Problems:   CORONARY ATHEROSCLEROSIS NATIVE CORONARY ARTERY  The patient presents with chest pain syndromes some with some without palpitations and some with and without exertion in the context of known coronary artery disease with prior bypass and PCI. We'll plan to undertake catheterization on Monday. Currently she is apinfree    Signed, Sherryl Manges MD  12/25/2012

## 2012-12-25 NOTE — Progress Notes (Signed)
ANTICOAGULATION CONSULT NOTE - Initial Consult  Pharmacy Consult for heparin Indication: chest pain/ACS  No Known Allergies  Patient Measurements: Heparin Dosing Weight: 80kg   Medical History: Past Medical History  Diagnosis Date  . Coronary atherosclerosis of native coronary artery     DES circ 10/10, residual 100% occluded SVG to OM, patent LIMA to LAD and SVG to PDA grafts, LVEF 50%  . Hyperlipemia   . Hypertension   . Bradycardia   . Palpitations     Transient PAT/AF, CHADS2 score 1  . Cataracts, bilateral      Assessment: 62yo female transferred from [facility] for cardiac evaluation, to continue heparin.  Goal of Therapy:  Heparin level 0.3-0.7 units/ml Monitor platelets by anticoagulation protocol: Yes   Plan:  Heparin was started at OSH at 800 units/hr; will increase to 1100 units/hr per wt and monitor heparin levels and CBC.  Vernard Gambles, PharmD, BCPS  12/25/2012,12:43 AM

## 2012-12-26 LAB — CBC
HCT: 42 % (ref 36.0–46.0)
MCH: 32.5 pg (ref 26.0–34.0)
MCV: 94.2 fL (ref 78.0–100.0)
Platelets: 183 10*3/uL (ref 150–400)
RDW: 13.1 % (ref 11.5–15.5)
WBC: 5.4 10*3/uL (ref 4.0–10.5)

## 2012-12-26 NOTE — Progress Notes (Signed)
  Patient Name: Heather Livingston      SUBJECTIVE: 62 year old woman admitted 3/14 for chest pain in the context of prior bypass surgery and post bypass stenting as recently 2011 She has been having chest pain over recent months. Myoview scan 9/13 was apparently negative.  She has been having 2 distinct syndromes. One is frequently associated with palpitations. She had these on a daily or every other day basis. Her past medical history describes PAf  will try and clarify that.  Recurrent palps at 11 am >>see below  No chest pain     Past Medical History  Diagnosis Date  . Coronary atherosclerosis of native coronary artery     DES circ 10/10, residual 100% occluded SVG to OM, patent LIMA to LAD and SVG to PDA grafts, LVEF 50%  . Hyperlipemia   . Hypertension   . Bradycardia   . Palpitations     Transient PAT/AF, CHADS2 score 1  . Cataracts, bilateral     PHYSICAL EXAM Filed Vitals:   12/25/12 1545 12/25/12 2100 12/26/12 0443 12/26/12 0930  BP: 134/72 120/77 142/68 155/67  Pulse: 56 58 59 62  Temp: 98.2 F (36.8 C) 98 F (36.7 C) 97.5 F (36.4 C)   TempSrc: Oral Oral Oral   Resp: 16 18 20    Height: 5' 5.5" (1.664 m)     Weight: 215 lb 9.6 oz (97.796 kg)     SpO2: 97% 97% 98%     Well developed and nourished in no acute distress HENT normal Neck supple with JVP-flat Clear Regular rate and rhythm, no murmurs or gallops Abd-soft with active BS No Clubbing cyanosis edema Skin-warm and dry A & Oriented  Grossly normal sensory and motor function  TELEMETRY: Reviewed telemetry pt in NSR run on non sustained atrial tach    Intake/Output Summary (Last 24 hours) at 12/26/12 1119 Last data filed at 12/26/12 0300  Gross per 24 hour  Intake    756 ml  Output      0 ml  Net    756 ml    LABS: Basic Metabolic Panel:  Recent Labs Lab 12/25/12 0540  NA 139  K 4.3  CL 103  CO2 26  GLUCOSE 87  BUN 18  CREATININE 0.90  CALCIUM 9.3  MG 2.0   Cardiac  Enzymes:  Recent Labs  12/25/12 0028 12/25/12 0530  TROPONINI <0.30 <0.30   CBC:  Recent Labs Lab 12/25/12 0540 12/26/12 0344  WBC 5.2 5.4  HGB 14.5 14.5  HCT 43.2 42.0  MCV 94.5 94.2  PLT 226 183   PROTIME:  Recent Labs  12/25/12 0540  LABPROT 12.9  INR 0.98    Recent Labs  12/25/12 0540  CHOL 110  HDL 25*  LDLCALC 54  TRIG 161*  CHOLHDL 4.4    ASSESSMENT AND PLAN:  Active Problems:   CORONARY ATHEROSCLEROSIS NATIVE CORONARY ARTERY  The patient presents with chest pain syndromes some with some without palpitations and some with and without exertion in the context of known coronary artery disease with prior bypass and PCI. We'll plan to undertake catheterization on Monday.  Palpitations correlated with nonsustained atrial tach>> this may be target for therapy will continue to try and assoc symptoms with arrhythmia   TSH elevated  Will check free T3 and T4   Signed, Sherryl Manges MD  12/26/2012

## 2012-12-26 NOTE — Progress Notes (Signed)
ANTICOAGULATION CONSULT NOTE - Follow up Consult  Pharmacy Consult for heparin Indication: chest pain/ACS  No Known Allergies  Patient Measurements: Heparin Dosing Weight: 80kg   Medical History: Past Medical History  Diagnosis Date  . Coronary atherosclerosis of native coronary artery     DES circ 10/10, residual 100% occluded SVG to OM, patent LIMA to LAD and SVG to PDA grafts, LVEF 50%  . Hyperlipemia   . Hypertension   . Bradycardia   . Palpitations     Transient PAT/AF, CHADS2 score 1  . Cataracts, bilateral      Assessment: 62yo female with CP transferred from [facility] for cardiac evaluation with Hx CAD/CABG. Heparin drip 1600 uts/hr HL 0.32.  CBC stable, no bleeding noted.  Plan for cath on Monday. Goal of Therapy:  Heparin level 0.3-0.7 units/ml Monitor platelets by anticoagulation protocol: Yes   Plan:  Continue Heparin drip 1600 uts/hr Daily CBC, Heparin level  Leota Sauers Pharm.D. CPP, BCPS Clinical Pharmacist [phone] 12/26/2012 2:24 PM

## 2012-12-26 NOTE — Progress Notes (Signed)
ANTICOAGULATION CONSULT NOTE - Follow Up Consult  Pharmacy Consult for heparin Indication: chest pain/ACS  Labs:  Recent Labs  12/25/12 0028 12/25/12 0530 12/25/12 0540 12/25/12 1905 12/26/12 0344  HGB  --   --  14.5  --  14.5  HCT  --   --  43.2  --  42.0  PLT  --   --  226  --  183  LABPROT  --   --  12.9  --   --   INR  --   --  0.98  --   --   HEPARINUNFRC  --   --  <0.10* 0.17* 0.20*  CREATININE  --   --  0.90  --   --   TROPONINI <0.30 <0.30  --   --   --     Assessment: 62yo female remains subtherapeutic on heparin though level is increasing slowly.  Goal of Therapy:  Heparin level 0.3-0.7 units/ml   Plan:  Will increase heparin gtt by 3 units/kg/hr to 1600 units/hr and check level in 6hr.  Vernard Gambles, PharmD, BCPS  12/26/2012,4:50 AM

## 2012-12-26 NOTE — Progress Notes (Signed)
Utilization Review Completed.Heather Livingston T3/16/2014  

## 2012-12-27 ENCOUNTER — Encounter (HOSPITAL_COMMUNITY)
Admission: AD | Disposition: A | Discharge status: Home / Self Care | Payer: Self-pay | Source: Other Acute Inpatient Hospital | Attending: Internal Medicine

## 2012-12-27 DIAGNOSIS — I2510 Atherosclerotic heart disease of native coronary artery without angina pectoris: Secondary | ICD-10-CM

## 2012-12-27 DIAGNOSIS — I7090 Unspecified atherosclerosis: Secondary | ICD-10-CM

## 2012-12-27 HISTORY — PX: LEFT HEART CATHETERIZATION WITH CORONARY/GRAFT ANGIOGRAM: SHX5450

## 2012-12-27 LAB — CBC
HCT: 43.5 % (ref 36.0–46.0)
HCT: 38.8 % (ref 36.0–46.0)
Hemoglobin: 13.6 g/dL (ref 12.0–15.0)
MCH: 31.7 pg (ref 26.0–34.0)
MCV: 94.6 fL (ref 78.0–100.0)
MCV: 90.9 fL (ref 78.0–100.0)
Platelets: 195 10*3/uL (ref 150–400)
Platelets: 180 10*3/uL (ref 150–400)
RBC: 4.6 MIL/uL (ref 3.87–5.11)
RBC: 4.27 MIL/uL (ref 3.87–5.11)
RDW: 13.1 % (ref 11.5–15.5)
WBC: 5 10*3/uL (ref 4.0–10.5)
WBC: 4.4 10*3/uL (ref 4.0–10.5)

## 2012-12-27 LAB — POCT ACTIVATED CLOTTING TIME: Activated Clotting Time: 127 seconds

## 2012-12-27 LAB — T4, FREE: Free T4: 1.1 ng/dL (ref 0.80–1.80)

## 2012-12-27 LAB — CREATININE, SERUM: GFR calc Af Amer: >90 mL/min (ref >90)

## 2012-12-27 SURGERY — LEFT HEART CATHETERIZATION WITH CORONARY/GRAFT ANGIOGRAM
Anesthesia: LOCAL

## 2012-12-27 MED ORDER — SODIUM CHLORIDE 0.9 % IJ SOLN: 3.0000 mL | INTRAMUSCULAR | Status: DC | PRN

## 2012-12-27 MED ORDER — HEPARIN SODIUM (PORCINE) 5000 UNIT/ML IJ SOLN
5000.0000 [IU] | Freq: Three times a day (TID) | INTRAMUSCULAR | Status: DC
  Administered 2012-12-28: 5000 [IU] via SUBCUTANEOUS
  Filled 2012-12-27 (×4): qty 1

## 2012-12-27 MED ORDER — MIDAZOLAM HCL 2 MG/2ML IJ SOLN
INTRAMUSCULAR | Status: AC
  Filled 2012-12-27: qty 2

## 2012-12-27 MED ORDER — HEPARIN (PORCINE) IN NACL 2-0.9 UNIT/ML-% IJ SOLN
INTRAMUSCULAR | Status: AC
  Filled 2012-12-27: qty 1000

## 2012-12-27 MED ORDER — FENTANYL CITRATE 0.05 MG/ML IJ SOLN
INTRAMUSCULAR | Status: AC
  Filled 2012-12-27: qty 2

## 2012-12-27 MED ORDER — SODIUM CHLORIDE 0.9 % IV SOLN: 250.0000 mL | INTRAVENOUS | Status: DC | PRN

## 2012-12-27 MED ORDER — LIDOCAINE HCL (PF) 1 % IJ SOLN
INTRAMUSCULAR | Status: AC
  Filled 2012-12-27: qty 30

## 2012-12-27 MED ORDER — SODIUM CHLORIDE 0.9 % IV SOLN
1.0000 mL/kg/h | INTRAVENOUS | Status: AC
  Administered 2012-12-27: 1 mL/kg/h via INTRAVENOUS

## 2012-12-27 MED ORDER — SODIUM CHLORIDE 0.9 % IJ SOLN
3.0000 mL | Freq: Two times a day (BID) | INTRAMUSCULAR | Status: DC
  Administered 2012-12-28: 3 mL via INTRAVENOUS

## 2012-12-27 MED ORDER — HEPARIN SODIUM (PORCINE) 5000 UNIT/ML IJ SOLN
5000.0000 [IU] | Freq: Three times a day (TID) | INTRAMUSCULAR | Status: DC
  Filled 2012-12-27 (×2): qty 1

## 2012-12-27 MED ORDER — DIAZEPAM 5 MG PO TABS
5.0000 mg | ORAL_TABLET | ORAL | Status: AC
  Administered 2012-12-27: 5 mg via ORAL
  Filled 2012-12-27: qty 1

## 2012-12-27 MED FILL — Nitroglycerin IV Soln 200 MCG/ML in D5W: INTRAVENOUS | Qty: 250 | Status: AC

## 2012-12-27 MED FILL — Heparin Sodium (Porcine) 100 Unt/ML in Sodium Chloride 0.45%: INTRAMUSCULAR | Qty: 250 | Status: AC

## 2012-12-27 NOTE — Progress Notes (Signed)
ANTICOAGULATION CONSULT NOTE - Follow Up Consult  Pharmacy Consult for Heparin Indication: chest pain/ACS  No Known Allergies  Patient Measurements: Height: 5' 5.5" (166.4 cm) Weight: 215 lb 9.6 oz (97.796 kg) IBW/kg (Calculated) : 58.15 Heparin Dosing Weight: 80kg   Vital Signs: Temp: 97.8 F (36.6 C) (03/17 0500) Temp src: Oral (03/17 0500) BP: 142/62 mmHg (03/17 0500) Pulse Rate: 59 (03/17 0500)  Labs:  Recent Labs  12/25/12 0028 12/25/12 0530  12/25/12 0540  12/26/12 0344 12/26/12 1302 12/27/12 0550  HGB  --   --   < > 14.5  --  14.5  --  14.6  HCT  --   --   --  43.2  --  42.0  --  43.5  PLT  --   --   --  226  --  183  --  195  LABPROT  --   --   --  12.9  --   --   --   --   INR  --   --   --  0.98  --   --   --   --   HEPARINUNFRC  --   --   --  <0.10*  < > 0.20* 0.32 0.34  CREATININE  --   --   --  0.90  --   --   --   --   TROPONINI <0.30 <0.30  --   --   --   --   --   --   < > = values in this interval not displayed.  Estimated Creatinine Clearance: 75.7 ml/min (by C-G formula based on Cr of 0.9).   Medications:  Scheduled:  . [COMPLETED] aspirin  324 mg Oral Pre-Cath  . aspirin EC  325 mg Oral Daily  . clopidogrel  75 mg Oral Daily  . [EXPIRED] diazepam  5 mg Oral On Call  . diazepam  5 mg Oral On Call  . hydrALAZINE  75 mg Oral TID  . isosorbide mononitrate  60 mg Oral Daily  . losartan  100 mg Oral Daily  . metoprolol  50 mg Oral BID  . simvastatin  40 mg Oral QHS    Assessment: 62 year old female with history of CAD/CABG transferred from [facility] for further evaluation of chest pain. Started on heparin for chest pain/ACS. Scheduled for cath today (3/17).   Heparin level today is therapeutic at 0.34 with heparin 1600 units/hr. H/H 14.6/43.5 and plts 195. No evidence of bleeding at this time.   Goal of Therapy:  Heparin level 0.3-0.7 units/ml Monitor platelets by anticoagulation protocol: Yes   Plan:  1. Continue heparin drip at 1600  units/hr  2. Daily heparin level and CBC  3. Follow-up after cath procedure  Micheline Chapman PharmD Candidate  12/27/2012,8:58 AM   I have reviewed Rebecca's assessment and agree with her plan. Dorna Leitz, PharmD, BCPS

## 2012-12-27 NOTE — Progress Notes (Addendum)
SBP=176.  I called Ronie Spies, PA she stated to give tonight's dose of PO hydralazine now.  I passed information to night RN Noreene Larsson, T.

## 2012-12-27 NOTE — CV Procedure (Signed)
   Cardiac Catheterization Procedure Note  Name: Heather Livingston MRN: [id number] DOB: 17-Nov-1950  Procedure: Left Heart Cath, Selective Coronary Angiography, LV angiography, abdominal aortic angiography, SVG angiography, LIMA angiography  Indication: Chest pain concerning for unstable angina   Procedural details: The right groin was prepped, draped, and anesthetized with 1% lidocaine. Using modified Seldinger technique, a 5 French sheath was introduced into the right femoral artery. Standard Judkins catheters were used for coronary angiography, SVG angiography, LIMA angiography, and left ventriculography. Catheter exchanges were performed over a guidewire. There were no immediate procedural complications. The patient was transferred to the post catheterization recovery area for further monitoring.  Procedural Findings: Hemodynamics:  AO 167/76 LV 176/22   Coronary angiography: Coronary dominance: right  Left mainstem: Long, calcified vessel, distal 40-50% stenosis  Left anterior descending (LAD): Total occlusion proximally. Fills from the LIMA.  Left circumflex (LCx): Tortuous, patent vessel. Large intermediate branch is patent. Proximal and mid-vessel stents are patent without significant ISR.  Right coronary artery (RCA): 100% proximal stenosis (total occlusion)  SVG - PDA: patent with mild diffuse irregularity. PDA is patent, PLA is patent  LIMA - LAD: widely patent. Distal anastomosis is patent  Left ventriculography: Left ventricular systolic function is low normal. There is mild inferior wall hypokinesis. LVEF is estimated at 50%.  Abdominal aortography: no aneurysm. Diffuse irregularity of the infrarenal aorta. Renal arteries are patent. Diffuse iliac disease but iliacs are poorly visualized.  Final Conclusions:   1. Severe 3 vessel CAD with total occlusion of the RCA, patent stents in the LCx, and total occlusion of the LAD 2. Continued patency of the LIMA to LAD and  SVG to PDA. 3. Known occlusion of the SVG to OM 4. Mild LV dysfunction with preserved LVEF  Recommendations: Continue current medical therapy for CAD.  Tonny Bollman 12/27/2012, 5:25 PM

## 2012-12-28 ENCOUNTER — Encounter (HOSPITAL_COMMUNITY): Payer: Self-pay | Admitting: General Practice

## 2012-12-28 DIAGNOSIS — I200 Unstable angina: Secondary | ICD-10-CM

## 2012-12-28 MED ORDER — TRAMADOL HCL 50 MG PO TABS: 50.0000 mg | ORAL_TABLET | Freq: Two times a day (BID) | ORAL | Status: DC | PRN

## 2012-12-28 NOTE — Progress Notes (Signed)
0740, pt fell after getting out of bed witnessed by person delivering breakfast.  Pt sitting in floor with back against wall on nursing arrival.  Pt denies any symptoms before fall, she states when she turned she lost her balance and everything she grabbed to hold on to moved and she fell on bottom.  Bottom red with skin intact, no visible injury, skin in tact, neuro intact, states right hand hurts where IV was bumped during event.  BP 188/94 HR 94, O2 sat 97% on room air.  Pt declines to have husband notified, she request we notify him when he arrives.  [place] PA notified.

## 2012-12-28 NOTE — Discharge Summary (Signed)
See progress notes Heather Livingston  

## 2012-12-28 NOTE — Progress Notes (Addendum)
     Patient: Heather Livingston Date of Encounter: 12/28/2012, 9:06 AM Admit date: 12/24/2012     Subjective  Ms. Blackard reports SOB this AM. However, her SOB has been ongoing for several months. She reports this occurs when she is sleeping and she is startled awake with the sensation that she has stopped breathing suddenly. She reports snoring. She also had fall this AM but states she lost her balance and "stiffened up" before she fell. She denies LOC or injury.     Objective  Physical Exam: Vitals: BP 169/69  Pulse 59  Temp(Src) 98.1 F (36.7 C) (Oral)  Resp 18  Ht 5' 5.5" (1.664 m)  Wt 215 lb 9.6 oz (97.796 kg)  BMI 35.32 kg/m2  SpO2 99% General: Well developed, well appearing 62 year old female in no acute distress. Neck: Supple. JVD not elevated. Lungs: Clear bilaterally to auscultation without wheezes, rales, or rhonchi. Breathing is unlabored. Heart: RRR S1 S2 without murmurs, rubs, or gallops.  Abdomen: Soft, non-distended. Extremities: No clubbing or cyanosis. No edema.  Distal pedal pulses are 2+ and equal bilaterally. Neuro: Alert and oriented X 3. Moves all extremities spontaneously. No focal deficits.  Intake/Output:  Intake/Output Summary (Last 24 hours) at 12/28/12 0906 Last data filed at 12/27/12 2200  Gross per 24 hour  Intake    820 ml  Output      0 ml  Net    820 ml    Inpatient Medications:  . aspirin EC  325 mg Oral Daily  . clopidogrel  75 mg Oral Daily  . heparin  5,000 Units Subcutaneous Q8H  . hydrALAZINE  75 mg Oral TID  . isosorbide mononitrate  60 mg Oral Daily  . losartan  100 mg Oral Daily  . metoprolol  50 mg Oral BID  . simvastatin  40 mg Oral QHS  . sodium chloride  3 mL Intravenous Q12H    Labs:  Recent Labs  12/27/12 1845  CREATININE 0.62    Recent Labs  12/27/12 0550 12/27/12 1845  WBC 5.0 4.4  HGB 14.6 13.6  HCT 43.5 38.8  MCV 94.6 90.9  PLT 195 180    Recent Labs  12/27/12 0550  T3FREE 3.0     Radiology/Studies: No results found.  Cardiac catheterization yesterday: Final Conclusions:  1. 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 Continued patency of the LIMA to LAD and SVG to PDA.  3. Known occlusion of the SVG to OM  4. Mild LV dysfunction with preserved LVEF  Recommendations: Continue current medical therapy for CAD.   Telemetry: sinus rhythm; no arrhtyhmias    Assessment and Plan  1. CAD - CP resolved; cath results as above; continue medical therapy 2. Nocturnal SOB - ? etiology; +snores - recommend outpt sleep study 3. Fall 4. HTN 5. Dyslipidemia Ms. Aggarwal's CP has resolved. Her cath showed stable CAD and continued medical management recommended. She reports nocturnal SOB and snoring so will plan for outpt sleep study. Probably home today.   Signed, EDMISTEN, BROOKE PA-C  As above; no chest pain; cath results noted; plan medical therapy. Patient fell this AM (no syncope; lost balance); complains of mild sacral pain but no obvious trauma to head or extremities. DC today on present meds and fu with Dr Diona Browner in 2- 4 weeks > 30 min PA and physician time D2 Donney Caraveo 10:00 AM

## 2012-12-28 NOTE — Discharge Summary (Addendum)
CARDIOLOGY DISCHARGE SUMMARY   Patient ID: Heather Livingston MRN: [id number] DOB/AGE: Feb 26, 1951 62 y.o.  Admit date: 12/24/2012 Discharge date: 12/28/2012  Primary Discharge Diagnosis:     Intermediate coronary syndrome - medical therapy recommended Secondary Discharge Diagnosis:    DYSLIPIDEMIA   SUPRAVENTRICULAR TACHYCARDIA - nonsustained atrial tachycardia   Palpitations   CORONARY ATHEROSCLEROSIS NATIVE CORONARY ARTERY  Procedures:Left Heart Cath, Selective Coronary Angiography, LV angiography, abdominal aortic angiography, SVG angiography, LIMA angiography  Hospital Course: Vincenza Pleshette Tomasini is a 61 y.o. female with a history of CAD. She was having repeated episodes of left-sided chest pain. She went to [facility] where she was treated with sublingual nitroglycerin, morphine and heparin. She was placed on a nitroglycerin drip and transferred to [facility] cone for further evaluation and treatment.  Unstable anginal pain/intermediate coronary syndrome: Her cardiac enzymes that were negative for MI. She has known coronary artery disease. She was taken to the Cath Lab on 12/27/2012. Results are below. Medical management of coronary artery disease was recommended.  Palpitations/nonsustained SVT/atrial tachycardia: She was having intermittent palpitations and Dr. Graciela Husbands reviewed the telemetry. He noted nonsustained atrial tachycardia that seemed to correlate with her palpitations. She is to continue her beta blocker and thyroid functions were tested. Her TSH was slightly elevated but her free T3 and T4 were within normal limits. She is to followup as an outpatient for palpitations and for thyroid function.  Dyslipidemia: A lipid profile was checked with results shown below. Her medication was reviewed and no changes are made currently. She is to followup as an outpatient for this as well.  On 12/28/2012, she reported some shortness of breath during sleep that was waking her, and  problems with snoring. An outpatient sleep study will be arranged through the [facility] office, they are aware. She was evaluated by Dr. Jens Som and considered stable for discharge, to follow up as an outpatient in [place].  Labs:   Lab Results  Component Value Date   WBC 4.4 12/27/2012   HGB 13.6 12/27/2012   HCT 38.8 12/27/2012   MCV 90.9 12/27/2012   PLT 180 12/27/2012     Recent Labs Lab 12/25/12 0540 12/27/12 1845  NA 139  --   K 4.3  --   CL 103  --   CO2 26  --   BUN 18  --   CREATININE 0.90 0.62  CALCIUM 9.3  --   GLUCOSE 87  --    Lipid Panel     Component Value Date/Time   CHOL 110 12/25/2012 0540   TRIG 157* 12/25/2012 0540   HDL 25* 12/25/2012 0540   CHOLHDL 4.4 12/25/2012 0540   VLDL 31 12/25/2012 0540   LDLCALC 54 12/25/2012 0540   Lab Results  Component Value Date    free T3   3.0  12/27/2012     free T4  1.10  12/27/2012    TSH 5.571* 12/25/2012   Cardiac Cath: 12/27/2012 Left mainstem: Long, calcified vessel, distal 40-50% stenosis  Left anterior descending (LAD): Total occlusion proximally. Fills from the LIMA.  Left circumflex (LCx): Tortuous, patent vessel. Large intermediate branch is patent. Proximal and mid-vessel stents are patent without significant ISR.  Right coronary artery (RCA): 100% proximal stenosis (total occlusion)  SVG - PDA: patent with mild diffuse irregularity. PDA is patent, PLA is patent  LIMA - LAD: widely patent. Distal anastomosis is patent  Left ventriculography: Left ventricular systolic function is low normal. There is mild inferior wall hypokinesis. LVEF  is estimated at 50%.  Abdominal aortography: no aneurysm. Diffuse irregularity of the infrarenal aorta. Renal arteries are patent. Diffuse iliac disease but iliacs are poorly visualized.  Final Conclusions:  1. 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 Continued patency of the LIMA to LAD and SVG to PDA.  3. Known  occlusion of the SVG to OM  4. Mild LV dysfunction with preserved LVEF  Recommendations: Continue current medical therapy for CAD.   EKG: 26-Dec-2012 18:29:59 Sinus bradycardia T wave abnormality, consider lateral ischemia Abnormal ECG the HR is slower. otherwise no significant changes Vent. rate 49 BPM PR interval 152 ms QRS duration 98 ms QT/QTc 434/392 ms P-R-T axes 64 38 -40  FOLLOW UP PLANS AND APPOINTMENTS No Known Allergies   Medication List    TAKE these medications       acetaminophen 650 MG CR tablet  Commonly known as:  TYLENOL  Take 650 mg by mouth every 8 (eight) hours as needed for pain.     aspirin 325 MG tablet  Take 325 mg by mouth daily.     buPROPion 100 MG tablet  Commonly known as:  WELLBUTRIN  Take 100 mg by mouth daily as needed (for  severe anxiety).     clopidogrel 75 MG tablet  Commonly known as:  PLAVIX  Take 75 mg by mouth daily.     diphenhydrAMINE 25 MG tablet  Commonly known as:  SOMINEX  Take 25 mg by mouth 3 times/day as needed-between meals & bedtime for itching, allergies or sleep.     hydrALAZINE 50 MG tablet  Commonly known as:  APRESOLINE  Take 75 mg by mouth 3 (three) times daily.     isosorbide mononitrate 60 MG 24 hr tablet  Commonly known as:  IMDUR  Take 1 tablet (60 mg total) by mouth daily.     losartan 100 MG tablet  Commonly known as:  COZAAR  Take 1 tablet (100 mg total) by mouth daily.     metoprolol 50 MG tablet  Commonly known as:  LOPRESSOR  Take 50 mg by mouth 2 (two) times daily.     nitroGLYCERIN 0.4 MG SL tablet  Commonly known as:  NITROSTAT  Place 0.4 mg under the tongue every 5 (five) minutes as needed for chest pain.     simvastatin 40 MG tablet  Commonly known as:  ZOCOR  Take 40 mg by mouth at bedtime.     traMADol 50 MG tablet  Commonly known as:  ULTRAM  Take 1 tablet (50 mg total) by mouth every 12 (twelve) hours as needed.        Discharge Orders   Future Appointments Provider  Department Dept Phone   01/14/2013 1:00 PM Prescott Parma, PA-C [facility] [place] [phone]   Future Orders Complete By Expires     Diet - low sodium heart healthy  As directed     Increase activity slowly  As directed       Follow-up Information   Follow up with SERPE, EUGENE, PA-C On 01/14/2013. (See for Dr Diona Browner at 1:00 pm)    Contact information:   [address] [place] [address] [phone]       BRING ALL MEDICATIONS WITH YOU TO FOLLOW UP APPOINTMENTS  Time spent with patient to include physician time: 35 min Signed: Theodore Demark, PA-C 12/28/2012, 1:11 PM Co-Sign MD

## 2012-12-30 ENCOUNTER — Telehealth: Payer: Self-pay | Admitting: *Deleted

## 2012-12-30 NOTE — Telephone Encounter (Signed)
Patient c/o elevated BP's Left arm 7:39 am BP 196/95 HR 58 before meds Left arm 9:52 am BP 140/73 HR 54  Right arm 9:54 am 140/66 HR 54 after meds Left arm 12:46 pm BP 169/72 HR 60 Right arm @ 12:49 pm 168/75 HR 59  Patient still c/o palpitations intermittently and said she is only drinking decaff coffee. No c/o dizziness or chest pain. Patient still has sob but not any worse than before hospitalization.

## 2012-12-30 NOTE — Telephone Encounter (Signed)
Recommend the following: pt to start a vitals log x1 week, with bid readings, and then present data at scheduled post hosp f/u on 01/14/13. Recommend AM vitals to be taken before meds.

## 2012-12-31 NOTE — Telephone Encounter (Signed)
Patient informed and verbalized understanding of plan. 

## 2013-01-04 ENCOUNTER — Other Ambulatory Visit: Payer: Self-pay | Admitting: Cardiology

## 2013-01-14 ENCOUNTER — Encounter: Payer: Self-pay | Admitting: Physician Assistant

## 2013-01-14 ENCOUNTER — Ambulatory Visit (INDEPENDENT_AMBULATORY_CARE_PROVIDER_SITE_OTHER): Payer: 59 | Admitting: Physician Assistant

## 2013-01-14 VITALS — BP 153/86 | HR 70 | Ht 65.0 in | Wt 216.0 lb

## 2013-01-14 DIAGNOSIS — I2510 Atherosclerotic heart disease of native coronary artery without angina pectoris: Secondary | ICD-10-CM

## 2013-01-14 DIAGNOSIS — E785 Hyperlipidemia, unspecified: Secondary | ICD-10-CM

## 2013-01-14 DIAGNOSIS — I10 Essential (primary) hypertension: Secondary | ICD-10-CM

## 2013-01-14 DIAGNOSIS — Z79899 Other long term (current) drug therapy: Secondary | ICD-10-CM

## 2013-01-14 DIAGNOSIS — I498 Other specified cardiac arrhythmias: Secondary | ICD-10-CM

## 2013-01-14 MED ORDER — ASPIRIN EC 81 MG PO TBEC: 81.0000 mg | DELAYED_RELEASE_TABLET | Freq: Every day | ORAL | Status: AC

## 2013-01-14 MED ORDER — HYDROCHLOROTHIAZIDE 25 MG PO TABS: 25.0000 mg | ORAL_TABLET | Freq: Every day | ORAL | Status: DC

## 2013-01-14 MED ORDER — METOPROLOL TARTRATE 50 MG PO TABS: 75.0000 mg | ORAL_TABLET | Freq: Two times a day (BID) | ORAL | Status: DC

## 2013-01-14 NOTE — Progress Notes (Signed)
Primary Cardiologist: Simona Huh, MD   HPI: Post hospital followup from [facility], following recent presentation with UAP. Negative cardiac markers. Cardiac catheterization performed, with medical therapy recommended. Patient also had documented nonsustained atrial tachycardia, reviewed by Dr. Graciela Husbands, with recommendation to continue management with beta blocker. TFTs notable for slightly elevated TSH, but normal free T3/T4. Lipid profile drawn, LDL 54. Plan was also to arrange for outpatient sleep study for further evaluation of reported snoring and nocturnal apnea.   - Cardiac catheterization, 3/17: Severe 3v CAD with 100% RCA and 100% LAD; patent CFX stents; patent L.-LAD and S.-PDA grafts; chronic 100% S.-OM graft; EF 50%, mild inferior HK  Patient reports no recurrent CP. She does have occasional palpitations. She denies complications of R groin incision site.  No Known Allergies  Current Outpatient Prescriptions  Medication Sig Dispense Refill  . acetaminophen (TYLENOL) 650 MG CR tablet Take 650 mg by mouth every 8 (eight) hours as needed for pain.      Marland Kitchen buPROPion (WELLBUTRIN) 100 MG tablet Take 100 mg by mouth daily as needed (for  severe anxiety).       . clopidogrel (PLAVIX) 75 MG tablet Take 75 mg by mouth daily.      . hydrALAZINE (APRESOLINE) 50 MG tablet TAKE 1&1/2 TABLETS THREE TIMES DAILY  405 tablet  3  . isosorbide mononitrate (IMDUR) 60 MG 24 hr tablet Take 1 tablet (60 mg total) by mouth daily.  30 tablet  6  . losartan (COZAAR) 100 MG tablet Take 1 tablet (100 mg total) by mouth daily.  90 tablet  0  . metoprolol (LOPRESSOR) 50 MG tablet Take 1.5 tablets (75 mg total) by mouth 2 (two) times daily.  90 tablet  6  . nitroGLYCERIN (NITROSTAT) 0.4 MG SL tablet Place 0.4 mg under the tongue every 5 (five) minutes as needed for chest pain.       . simvastatin (ZOCOR) 40 MG tablet Take 40 mg by mouth at bedtime.      . traMADol (ULTRAM) 50 MG tablet Take 1 tablet (50 mg total) by mouth  every 12 (twelve) hours as needed.  30 tablet  0  . aspirin EC 81 MG tablet Take 1 tablet (81 mg total) by mouth daily.      . hydrochlorothiazide (HYDRODIURIL) 25 MG tablet Take 1 tablet (25 mg total) by mouth daily.  30 tablet  6   No current facility-administered medications for this visit.    Past Medical History  Diagnosis Date  . Coronary atherosclerosis of native coronary artery     DES circ 10/10, residual 100% occluded SVG to OM, patent LIMA to LAD and SVG to PDA grafts, LVEF 50%  . Hyperlipemia   . Hypertension   . Bradycardia   . Palpitations     Transient PAT/AF, CHADS2 score 1  . Cataracts, bilateral   . Shortness of breath   . GERD (gastroesophageal reflux disease)     Past Surgical History  Procedure Laterality Date  . Abdominal hysterectomy    . Carpal tunnel release    . Coronary artery bypass graft      [facility], LIMA to LAD, SVG to OM, SVG to PDA    History   Social History  . Marital Status: Married    Spouse Name: N/A    Number of Children: N/A  . Years of Education: N/A   Occupational History  . Disabled    Social History Main Topics  . Smoking status: Former Smoker  Types: Cigarettes    Quit date: 10/14/2007  . Smokeless tobacco: Never Used  . Alcohol Use: Yes     Comment: Occasional wine or beer  . Drug Use: No  . Sexually Active: Not on file   Other Topics Concern  . Not on file   Social History Narrative  . No narrative on file    Family History  Problem Relation Age of Onset  . Heart attack Father   . Hypertension Brother     ROS: no nausea, vomiting; no fever, chills; no melena, hematochezia; no claudication  PHYSICAL EXAM: BP 153/86  Pulse 70  Ht 5\' 5"  (1.651 m)  Wt 216 lb (97.977 kg)  BMI 35.94 kg/m2  SpO2 95% GENERAL: 62 year old female; NAD HEENT: NCAT, PERRLA, EOMI; sclera clear; no xanthelasma NECK: palpable bilateral carotid pulses, no bruits; no JVD; no TM LUNGS: CTA bilaterally CARDIAC: RRR (S1, S2);  no significant murmurs; no rubs or gallops ABDOMEN: soft, non-tender; intact BS EXTREMETIES: intact R femoral pulses, no hematoma/bruit; no significant peripheral edema SKIN: warm/dry; no obvious rash/lesions MUSCULOSKELETAL: no joint deformity NEURO: no focal deficit; NL affect   EKG:    ASSESSMENT & PLAN:  CORONARY ATHEROSCLEROSIS NATIVE CORONARY ARTERY Patient denies recurrent CP. Results of recent catheterization were reviewed in detail with the patient, assuring her that no new culprit lesions were noted. I specifically pointed out the fact that the previously placed CFX stents remained patent, as well as the LIMA-LAD and SVG-PDA grafts. I also explained that the 100% occluded SVG-OM graft had been previously documented. Therefore, continued medical management is recommended. I will decrease ASA to 81, given that she remains on Plavix.  ESSENTIAL HYPERTENSION, BENIGN Remains labile, based on recent ambulatory readings notable for systolics as high as 196. Notably, however, patient had just recently run out of HCTZ, which was not resumed at time of discharge. Therefore, will place her back on HCTZ 25 mg daily. Followup BMET in one week. She is to otherwise continue on her remaining antihypertensive regimen.  DYSLIPIDEMIA Well-controlled on current dose of simvastatin, following recent LDL 54.  SUPRAVENTRICULAR TACHYCARDIA Patient continues to have recurrent palpitations. Recently diagnosed with nonsustained atrial tachycardia. Will increase Lopressor to 75 twice a day.    Gene Kemiah Booz, PAC

## 2013-01-14 NOTE — Assessment & Plan Note (Signed)
Well-controlled on current dose of simvastatin, following recent LDL 54.

## 2013-01-14 NOTE — Patient Instructions (Signed)
   Decrease Aspirin to 81mg  daily  Begin HCTZ 25mg  daily  Increase Lopressor to 75mg  twice a day   Continue all other current medications. Lab in 1 week for BMET, BNP - around January 21, 2013 Office will notify of results Follow up in  3 months

## 2013-01-14 NOTE — Assessment & Plan Note (Signed)
Remains labile, based on recent ambulatory readings notable for systolics as high as 196. Notably, however, patient had just recently run out of HCTZ, which was not resumed at time of discharge. Therefore, will place her back on HCTZ 25 mg daily. Followup BMET in one week. She is to otherwise continue on her remaining antihypertensive regimen.

## 2013-01-14 NOTE — Assessment & Plan Note (Signed)
Patient continues to have recurrent palpitations. Recently diagnosed with nonsustained atrial tachycardia. Will increase Lopressor to 75 twice a day.

## 2013-01-14 NOTE — Assessment & Plan Note (Signed)
Patient denies recurrent CP. Results of recent catheterization were reviewed in detail with the patient, assuring her that no new culprit lesions were noted. I specifically pointed out the fact that the previously placed CFX stents remained patent, as well as the LIMA-LAD and SVG-PDA grafts. I also explained that the 100% occluded SVG-OM graft had been previously documented. Therefore, continued medical management is recommended. I will decrease ASA to 81, given that she remains on Plavix.

## 2013-02-02 ENCOUNTER — Other Ambulatory Visit: Payer: Self-pay | Admitting: Cardiology

## 2013-02-02 ENCOUNTER — Telehealth: Payer: Self-pay | Admitting: *Deleted

## 2013-02-02 NOTE — Telephone Encounter (Signed)
Message copied by Eustace Moore on Wed Feb 02, 2013  9:09 AM ------      Message from: Rande Brunt      Created: Tue Feb 01, 2013 11:44 AM       Stable labs. Continue current medication regimen.       ------

## 2013-02-02 NOTE — Telephone Encounter (Signed)
Patient informed. 

## 2013-02-11 ENCOUNTER — Other Ambulatory Visit: Payer: Self-pay | Admitting: *Deleted

## 2013-02-11 MED ORDER — SIMVASTATIN 40 MG PO TABS: 40.0000 mg | ORAL_TABLET | Freq: Every day | ORAL | Status: DC

## 2013-02-28 ENCOUNTER — Other Ambulatory Visit: Payer: Self-pay | Admitting: *Deleted

## 2013-02-28 MED ORDER — SIMVASTATIN 40 MG PO TABS: 40.0000 mg | ORAL_TABLET | Freq: Every day | ORAL | Status: DC

## 2013-03-08 ENCOUNTER — Other Ambulatory Visit: Payer: Self-pay | Admitting: *Deleted

## 2013-03-08 MED ORDER — ISOSORBIDE MONONITRATE ER 60 MG PO TB24: 60.0000 mg | ORAL_TABLET | Freq: Every day | ORAL | Status: DC

## 2013-03-28 ENCOUNTER — Other Ambulatory Visit: Payer: Self-pay | Admitting: Cardiology

## 2013-03-28 NOTE — Telephone Encounter (Signed)
Medication sent via escribe.  

## 2013-03-30 ENCOUNTER — Telehealth: Payer: Self-pay | Admitting: Physician Assistant

## 2013-03-30 MED ORDER — LOSARTAN POTASSIUM 100 MG PO TABS: 100.0000 mg | ORAL_TABLET | Freq: Every day | ORAL | Status: DC

## 2013-03-30 NOTE — Telephone Encounter (Signed)
[facility] told her that she needed to make an appointment but she was just here to see [place] in April.  Please send refill  In and call her if she does need to make another appointment to be seen.  You may leave a message if she is not home  losartan (COZAAR) 100 MG tablet

## 2013-07-12 ENCOUNTER — Other Ambulatory Visit: Payer: Self-pay | Admitting: Cardiology

## 2013-07-12 MED ORDER — METOPROLOL TARTRATE 50 MG PO TABS: 75.0000 mg | ORAL_TABLET | Freq: Two times a day (BID) | ORAL | Status: DC

## 2013-07-12 MED ORDER — CLOPIDOGREL BISULFATE 75 MG PO TABS: 75.0000 mg | ORAL_TABLET | Freq: Every day | ORAL | Status: DC

## 2013-07-12 MED ORDER — ISOSORBIDE MONONITRATE ER 60 MG PO TB24: 60.0000 mg | ORAL_TABLET | Freq: Every day | ORAL | Status: DC

## 2013-07-12 MED ORDER — SIMVASTATIN 40 MG PO TABS: 40.0000 mg | ORAL_TABLET | Freq: Every day | ORAL | Status: DC

## 2013-07-12 MED ORDER — HYDROCHLOROTHIAZIDE 25 MG PO TABS: 25.0000 mg | ORAL_TABLET | Freq: Every day | ORAL | Status: DC

## 2013-10-11 ENCOUNTER — Other Ambulatory Visit: Payer: Self-pay | Admitting: Physician Assistant

## 2013-10-11 ENCOUNTER — Telehealth: Payer: Self-pay | Admitting: Cardiology

## 2013-10-11 MED ORDER — CLOPIDOGREL BISULFATE 75 MG PO TABS: 75.0000 mg | ORAL_TABLET | Freq: Every day | ORAL | Status: DC

## 2013-10-11 NOTE — Telephone Encounter (Signed)
Received fax refill request  Rx # [id number] Medication:  Clopidogrel 75 mg tablet  Qty 30 Sig:  Take one tablet by mouth daily Physician:  Diona Browner

## 2013-10-21 ENCOUNTER — Encounter: Payer: Self-pay | Admitting: Cardiology

## 2013-10-21 ENCOUNTER — Ambulatory Visit (INDEPENDENT_AMBULATORY_CARE_PROVIDER_SITE_OTHER): Payer: 59 | Admitting: Cardiology

## 2013-10-21 ENCOUNTER — Encounter (INDEPENDENT_AMBULATORY_CARE_PROVIDER_SITE_OTHER): Payer: Self-pay

## 2013-10-21 VITALS — BP 134/78 | HR 73 | Ht 65.5 in | Wt 218.0 lb

## 2013-10-21 DIAGNOSIS — I2510 Atherosclerotic heart disease of native coronary artery without angina pectoris: Secondary | ICD-10-CM

## 2013-10-21 DIAGNOSIS — I498 Other specified cardiac arrhythmias: Secondary | ICD-10-CM

## 2013-10-21 DIAGNOSIS — I10 Essential (primary) hypertension: Secondary | ICD-10-CM

## 2013-10-21 DIAGNOSIS — E785 Hyperlipidemia, unspecified: Secondary | ICD-10-CM

## 2013-10-21 MED ORDER — METOPROLOL TARTRATE 50 MG PO TABS: 50.0000 mg | ORAL_TABLET | Freq: Three times a day (TID) | ORAL | Status: DC

## 2013-10-21 MED ORDER — NITROGLYCERIN 0.4 MG SL SUBL: 0.4000 mg | SUBLINGUAL_TABLET | SUBLINGUAL | Status: DC | PRN

## 2013-10-21 NOTE — Assessment & Plan Note (Signed)
No change antihypertensive regimen. 

## 2013-10-21 NOTE — Patient Instructions (Signed)
   Change Lopressor to 50mg  three times per day - please call office to advise if this works better for you.   Continue all other medications.   Your physician wants you to follow up in: 6 months.  You will receive a reminder letter in the mail one-two months in advance.  If you don't receive a letter, please call our office to schedule the follow up appointment

## 2013-10-21 NOTE — Assessment & Plan Note (Signed)
Continues on Zocor. Lipids followed by Dr. Melvyn NethLewis.

## 2013-10-21 NOTE — Assessment & Plan Note (Signed)
Multivessel disease status post CABG, cardiac catheterization from last year noted above with plan to continue medical therapy and observation.

## 2013-10-21 NOTE — Assessment & Plan Note (Signed)
Prior documentation of atrial tachycardia. We will try and modify her Lopressor to 50 mg 3 times a day to see if this provides better suppression. Otherwise we might consider increasing to 100 mg twice daily.

## 2013-10-21 NOTE — Progress Notes (Signed)
Clinical Summary Ms. Hartfield is a 63 y.o.female last seen by Mr. Serpe PA-C in April 2014. She reports no chest pain symptoms, but continued sensation of intermittent palpitations. Sometimes these episodes can last for a few minutes, rarely that she feels her symptoms for up to an hour. She reports compliance with her medications.  Cardiac catheterization in March 2014 demonstrated multivessel disease with occluded RCA and LAD, patent circumflex stents, patent LIMA to LAD, patent SVG to PDA, chronically occluded SVG to obtuse marginal, LVEF 50% with inferior hypokinesis. She has been managed medically.  She continues to follow lipids with Dr. Melvyn Neth, states that her last LDL was 52.  Reports decreased energy, some symptoms of depression.  No Known Allergies  Current Outpatient Prescriptions  Medication Sig Dispense Refill  . aspirin EC 81 MG tablet Take 1 tablet (81 mg total) by mouth daily.      Marland Kitchen buPROPion (WELLBUTRIN) 100 MG tablet Take 100 mg by mouth daily as needed (for  severe anxiety).       . clopidogrel (PLAVIX) 75 MG tablet Take 1 tablet (75 mg total) by mouth daily.  90 tablet  0  . hydrALAZINE (APRESOLINE) 50 MG tablet TAKE 1&1/2 TABLETS THREE TIMES DAILY  405 tablet  3  . hydrochlorothiazide (HYDRODIURIL) 25 MG tablet TAKE 1 TABLET BY MOUTH DAILY  30 tablet  2  . isosorbide mononitrate (IMDUR) 60 MG 24 hr tablet Take 1 tablet (60 mg total) by mouth daily.  90 tablet  3  . losartan (COZAAR) 100 MG tablet Take 1 tablet (100 mg total) by mouth daily.  90 tablet  3  . metoprolol (LOPRESSOR) 50 MG tablet Take 1 tablet (50 mg total) by mouth 3 (three) times daily.      . nitroGLYCERIN (NITROSTAT) 0.4 MG SL tablet Place 1 tablet (0.4 mg total) under the tongue every 5 (five) minutes as needed for chest pain.  25 tablet  3  . simvastatin (ZOCOR) 40 MG tablet Take 1 tablet (40 mg total) by mouth at bedtime.  90 tablet  3   No current facility-administered medications for this visit.      Past Medical History  Diagnosis Date  . Coronary atherosclerosis of native coronary artery     DES circ 10/10, residual 100% occluded SVG to OM, patent LIMA to LAD and SVG to PDA grafts, LVEF 50%  . Hyperlipemia   . Hypertension   . Bradycardia   . Palpitations     Transient PAT/AF, CHADS2 score 1  . Cataracts, bilateral   . GERD (gastroesophageal reflux disease)     Past Surgical History  Procedure Laterality Date  . Abdominal hysterectomy    . Carpal tunnel release    . Coronary artery bypass graft      [facility], LIMA to LAD, SVG to OM, SVG to PDA    Social History Ms. Benning reports that she quit smoking about 6 years ago. Her smoking use included Cigarettes. She smoked 0.00 packs per day. She has never used smokeless tobacco. Ms. Swiech reports that she drinks alcohol.  Review of Systems She has been trying to lose weight by dieting, limited by arthritic hip pain however in terms of exercise. No bleeding problems. No dizziness or syncope. Otherwise as outlined.  Physical Examination Filed Vitals:   10/21/13 1313  BP: 134/78  Pulse: 73   Filed Weights   10/21/13 1313  Weight: 218 lb (98.884 kg)    Overweight woman in no acute  distress.  HEENT: Conjunctiva and lids normal, oropharynx with moist mucosa.  Neck: Supple, no elevated JVP, no bruits, no thyromegaly.  Lungs: Coarse breath sounds, otherwise clear, nonlabored.  Cardiac: Regular rate and rhythm, no significant murmur, no S3.  Abdomen: Soft, nontender pelvis is present.  Extremities: Distal pulses one plus, no edema.  Skin: Warm and dry, tattoo noted on back.  Musculoskeletal: No kyphosis.  Neuropsychiatric: Alert and oriented x3, affect appropriate.   Problem List and Plan   CORONARY ATHEROSCLEROSIS NATIVE CORONARY ARTERY Multivessel disease status post CABG, cardiac catheterization from last year noted above with plan to continue medical therapy and observation.  SUPRAVENTRICULAR  TACHYCARDIA Prior documentation of atrial tachycardia. We will try and modify her Lopressor to 50 mg 3 times a day to see if this provides better suppression. Otherwise we might consider increasing to 100 mg twice daily.  Essential hypertension, benign No change antihypertensive regimen.  DYSLIPIDEMIA Continues on Zocor. Lipids followed by Dr. Melvyn NethLewis.    Jonelle SidleSamuel G. McDowell, M.D., F.A.C.C.

## 2013-10-31 ENCOUNTER — Telehealth: Payer: Self-pay | Admitting: Cardiology

## 2013-10-31 NOTE — Telephone Encounter (Signed)
See my note.  Lets try to go to metoprolol 100 mg BID.

## 2013-10-31 NOTE — Telephone Encounter (Signed)
Called pt and asked if she had any chest pain or shortness of breath. Pt denies any chest pain but states she is having some shortness of breath. PT c/o no light headiness or dizziness. Pt also states that she is having a little more palpitations than she was before the direction change on her metoprolol.

## 2013-11-01 MED ORDER — METOPROLOL TARTRATE 100 MG PO TABS: 100.0000 mg | ORAL_TABLET | Freq: Two times a day (BID) | ORAL | Status: DC

## 2013-11-01 NOTE — Telephone Encounter (Signed)
Patient informed. 

## 2013-11-14 ENCOUNTER — Other Ambulatory Visit: Payer: Self-pay | Admitting: *Deleted

## 2013-11-14 MED ORDER — SIMVASTATIN 40 MG PO TABS: 40.0000 mg | ORAL_TABLET | Freq: Every day | ORAL | Status: DC

## 2013-11-14 MED ORDER — ISOSORBIDE MONONITRATE ER 60 MG PO TB24: 60.0000 mg | ORAL_TABLET | Freq: Every day | ORAL | Status: DC

## 2013-11-18 ENCOUNTER — Other Ambulatory Visit: Payer: Self-pay | Admitting: Physician Assistant

## 2014-01-16 ENCOUNTER — Other Ambulatory Visit: Payer: Self-pay | Admitting: Cardiology

## 2014-01-16 MED ORDER — CLOPIDOGREL BISULFATE 75 MG PO TABS: 75.0000 mg | ORAL_TABLET | Freq: Every day | ORAL | Status: DC

## 2014-02-02 ENCOUNTER — Other Ambulatory Visit: Payer: Self-pay | Admitting: Cardiology

## 2014-04-26 ENCOUNTER — Other Ambulatory Visit: Payer: Self-pay | Admitting: *Deleted

## 2014-04-26 MED ORDER — HYDROCHLOROTHIAZIDE 25 MG PO TABS: 25.0000 mg | ORAL_TABLET | Freq: Every day | ORAL | Status: DC

## 2014-04-27 ENCOUNTER — Encounter: Payer: Self-pay | Admitting: Cardiology

## 2014-04-27 ENCOUNTER — Ambulatory Visit (INDEPENDENT_AMBULATORY_CARE_PROVIDER_SITE_OTHER): Payer: 59 | Admitting: Cardiology

## 2014-04-27 VITALS — BP 162/78 | HR 55 | Ht 65.0 in | Wt 219.8 lb

## 2014-04-27 DIAGNOSIS — I2510 Atherosclerotic heart disease of native coronary artery without angina pectoris: Secondary | ICD-10-CM

## 2014-04-27 DIAGNOSIS — I498 Other specified cardiac arrhythmias: Secondary | ICD-10-CM

## 2014-04-27 DIAGNOSIS — E785 Hyperlipidemia, unspecified: Secondary | ICD-10-CM

## 2014-04-27 DIAGNOSIS — I10 Essential (primary) hypertension: Secondary | ICD-10-CM

## 2014-04-27 NOTE — Assessment & Plan Note (Signed)
Keep follow with Dr. Melvyn NethLewis. She is due for a lipid panel. Continues on Zocor.

## 2014-04-27 NOTE — Assessment & Plan Note (Signed)
Blood pressure elevated today. She reports compliance with her medications. I discussed diet and exercise with her. Keep follow with Dr. Melvyn NethLewis.

## 2014-04-27 NOTE — Assessment & Plan Note (Signed)
Better control of palpitations on high-dose Lopressor.

## 2014-04-27 NOTE — Assessment & Plan Note (Signed)
Symptomatically stable on medical therapy with history of multivessel disease status post percutaneous interventions and CABG. Continue current medical regimen.

## 2014-04-27 NOTE — Progress Notes (Signed)
Clinical Summary [name] is a 63 y.o.female last seen in January. Since that time Lopressor dose has been advanced. She reports better control of palpitations, has had no angina symptoms. She has not been exercising very much, sometimes swims around in her swimming pool. No regular walking. ECG today shows sinus rhythm with old inferolateral ST-T wave abnormalities.  She tells me that she'll be seeing Heather Livingston for a physical with lab work soon. She continues on Zocor.  Cardiac catheterization in March 2014 demonstrated multivessel disease with occluded RCA and LAD, patent circumflex stents, patent LIMA to LAD, patent SVG to PDA, chronically occluded SVG to obtuse marginal, LVEF 50% with inferior hypokinesis. She has been managed medically.   No Known Allergies  Current Outpatient Prescriptions  Medication Sig Dispense Refill  . aspirin EC 81 MG tablet Take 1 tablet (81 mg total) by mouth daily.      Marland Kitchen. buPROPion (WELLBUTRIN) 100 MG tablet Take 100 mg by mouth daily as needed (for  severe anxiety).       . clopidogrel (PLAVIX) 75 MG tablet Take 1 tablet (75 mg total) by mouth daily.  90 tablet  3  . hydrALAZINE (APRESOLINE) 50 MG tablet TAKE 1&1/2 TABLETS THREE TIMES DAILY  405 tablet  3  . hydrochlorothiazide (HYDRODIURIL) 25 MG tablet Take 1 tablet (25 mg total) by mouth daily.  90 tablet  3  . isosorbide mononitrate (IMDUR) 60 MG 24 hr tablet Take 1 tablet (60 mg total) by mouth daily.  90 tablet  3  . losartan (COZAAR) 100 MG tablet Take 1 tablet (100 mg total) by mouth daily.  90 tablet  3  . metoprolol (LOPRESSOR) 100 MG tablet Take 1 tablet (100 mg total) by mouth 2 (two) times daily.  180 tablet  3  . nitroGLYCERIN (NITROSTAT) 0.4 MG SL tablet Place 1 tablet (0.4 mg total) under the tongue every 5 (five) minutes as needed for chest pain.  25 tablet  3  . simvastatin (ZOCOR) 40 MG tablet Take 1 tablet (40 mg total) by mouth at bedtime.  90 tablet  3  . celecoxib (CELEBREX) 200 MG  capsule Take 200 mg by mouth daily.      . METHYLPREDNISOLONE PO Take 4 mg by mouth. Taper pack       No current facility-administered medications for this visit.    Past Medical History  Diagnosis Date  . Coronary atherosclerosis of native coronary artery     DES circ 10/10, residual 100% occluded SVG to OM, patent LIMA to LAD and SVG to PDA grafts, LVEF 50%  . Hyperlipemia   . Hypertension   . Bradycardia   . Palpitations     Transient PAT/AF, CHADS2 score 1  . Cataracts, bilateral   . GERD (gastroesophageal reflux disease)     Past Surgical History  Procedure Laterality Date  . Abdominal hysterectomy    . Carpal tunnel release    . Coronary artery [facility]      [facility] LIMA to LAD, SVG to OM, SVG to PDA    Social History [name] reports that she quit smoking about 6 years ago. Her smoking use included Cigarettes. She smoked 0.00 packs per day. She has never used smokeless tobacco. [name] reports that she drinks alcohol.  Review of Systems No palpitations, dizziness, syncope. No bleeding problems. Has chronic back and leg pain. Other systems reviewed and negative.  Physical Examination Filed Vitals:   04/27/14 1604  BP:  162/78  Pulse: 55   Filed Weights   04/27/14 1604  Weight: 219 lb 12.8 oz (99.701 kg)    Overweight woman in no acute distress.  HEENT: Conjunctiva and lids normal, oropharynx with moist mucosa.  Neck: Supple, no elevated JVP, no bruits, no thyromegaly.  Lungs: Coarse breath sounds, otherwise clear, nonlabored.  Cardiac: Regular rate and rhythm, no significant murmur, no S3.  Abdomen: Soft, nontender pelvis is present.  Extremities: Distal pulses one plus, no edema.  Skin: Warm and dry, tattoo noted on back.  Musculoskeletal: No kyphosis.  Neuropsychiatric: Alert and oriented x3, affect appropriate.   Problem List and Plan   CORONARY ATHEROSCLEROSIS NATIVE CORONARY ARTERY Symptomatically stable on medical therapy with  history of multivessel disease status post percutaneous interventions and CABG. Continue current medical regimen.  Essential hypertension, benign Blood pressure elevated today. She reports compliance with her medications. I discussed diet and exercise with her. Keep follow with Dr. Melvyn Neth.  DYSLIPIDEMIA Keep follow with Dr. Melvyn Neth. She is due for a lipid panel. Continues on Zocor.  SUPRAVENTRICULAR TACHYCARDIA Better control of palpitations on high-dose Lopressor.    Heather Livingston, M.D., F.A.C.C.

## 2014-04-27 NOTE — Patient Instructions (Signed)

## 2014-04-28 ENCOUNTER — Ambulatory Visit: Payer: 59 | Admitting: Cardiology

## 2014-05-24 ENCOUNTER — Telehealth: Payer: Self-pay | Admitting: Cardiology

## 2014-05-24 NOTE — Telephone Encounter (Signed)
Patient informed that this medication comes in a capsule. Nurse advised patient to have her pharmacy get this in a capsule and if they didn't have it available, patient advised to call nurse back so that the prescription can be sent to a different pharmacy. Patient verbalized understanding of plan.

## 2014-05-24 NOTE — Telephone Encounter (Signed)
Received notice from [facility] a recall for medication of HYDROCHLOROTHIAZIDE 25 MG PO TABS Please advise.

## 2014-06-28 ENCOUNTER — Other Ambulatory Visit: Payer: Self-pay | Admitting: *Deleted

## 2014-06-28 MED ORDER — LOSARTAN POTASSIUM 100 MG PO TABS: 100.0000 mg | ORAL_TABLET | Freq: Every day | ORAL | Status: DC

## 2014-07-18 ENCOUNTER — Other Ambulatory Visit: Payer: Self-pay | Admitting: *Deleted

## 2014-07-18 MED ORDER — HYDRALAZINE HCL 50 MG PO TABS: 75.0000 mg | ORAL_TABLET | Freq: Three times a day (TID) | ORAL | Status: DC

## 2014-09-21 ENCOUNTER — Encounter (HOSPITAL_COMMUNITY): Payer: Self-pay | Admitting: Cardiology

## 2014-11-17 ENCOUNTER — Other Ambulatory Visit: Payer: Self-pay | Admitting: Cardiology

## 2014-11-17 ENCOUNTER — Telehealth: Payer: Self-pay | Admitting: *Deleted

## 2014-11-17 MED ORDER — SIMVASTATIN 40 MG PO TABS: 40.0000 mg | ORAL_TABLET | Freq: Every day | ORAL | Status: DC

## 2014-11-17 MED ORDER — ISOSORBIDE MONONITRATE ER 60 MG PO TB24: 60.0000 mg | ORAL_TABLET | Freq: Every day | ORAL | Status: DC

## 2014-11-17 NOTE — Telephone Encounter (Signed)
[facility] [place] refill on simvastatin 40 mg daily.

## 2014-11-17 NOTE — Telephone Encounter (Signed)
Refill request for isosorbide 60 mg from [facility] Medication sent to pharmacy.

## 2014-12-14 ENCOUNTER — Other Ambulatory Visit: Payer: Self-pay | Admitting: *Deleted

## 2014-12-14 MED ORDER — METOPROLOL TARTRATE 100 MG PO TABS: 100.0000 mg | ORAL_TABLET | Freq: Two times a day (BID) | ORAL | Status: DC

## 2014-12-14 MED ORDER — ISOSORBIDE MONONITRATE ER 60 MG PO TB24: 60.0000 mg | ORAL_TABLET | Freq: Every day | ORAL | Status: DC

## 2014-12-14 MED ORDER — HYDROCHLOROTHIAZIDE 25 MG PO TABS: 25.0000 mg | ORAL_TABLET | Freq: Every day | ORAL | Status: DC

## 2014-12-14 MED ORDER — SIMVASTATIN 40 MG PO TABS: 40.0000 mg | ORAL_TABLET | Freq: Every day | ORAL | Status: DC

## 2014-12-14 MED ORDER — CLOPIDOGREL BISULFATE 75 MG PO TABS: 75.0000 mg | ORAL_TABLET | Freq: Every day | ORAL | Status: DC

## 2014-12-21 ENCOUNTER — Telehealth: Payer: Self-pay | Admitting: Cardiology

## 2014-12-21 NOTE — Telephone Encounter (Signed)
Mrs. Debroah Looprnold called stating that her heart start racing on Sunday. States that it is coming and going.  Appointment has been made for 12-29-14.

## 2014-12-22 NOTE — Telephone Encounter (Signed)
Patient c/o Heart rate feeling like its racing. Per patient her O2 sat 94 % HR 62. Patient said her heart feels like its flopping around. No c/o chest pain. Patient given an appointment to be sen on Monday 12/25/14 and advised if symptoms get worse to proceed to the ED for an evaluation.

## 2014-12-25 ENCOUNTER — Ambulatory Visit (INDEPENDENT_AMBULATORY_CARE_PROVIDER_SITE_OTHER): Payer: Medicare Other | Admitting: Cardiology

## 2014-12-25 ENCOUNTER — Encounter: Payer: Self-pay | Admitting: Cardiology

## 2014-12-25 VITALS — BP 155/78 | HR 51 | Ht 65.0 in | Wt 224.4 lb

## 2014-12-25 DIAGNOSIS — R002 Palpitations: Secondary | ICD-10-CM

## 2014-12-25 DIAGNOSIS — I2510 Atherosclerotic heart disease of native coronary artery without angina pectoris: Secondary | ICD-10-CM

## 2014-12-25 MED ORDER — NITROGLYCERIN 0.4 MG SL SUBL: 0.4000 mg | SUBLINGUAL_TABLET | SUBLINGUAL | Status: DC | PRN

## 2014-12-25 NOTE — Progress Notes (Signed)
Cardiology Office Note  Date: 12/25/2014   ID: Heather Livingston, DOB 05/28/1951, MRN [id number]  PCP: Theodoro KosLEWIS,WILLIAM B, MD  Primary Cardiologist: Nona DellSamuel McDowell, MD   Chief Complaint  Patient presents with  . Palpitations  . Coronary Artery Disease    History of Present Illness: Heather Livingston is a 64 y.o. female last seen in June 2015. Recent telephone notes reviewed, patient complaining of a feeling of racing heartbeat and palpitations. She states that these symptoms have flared up over the last several weeks, she now feels this almost on a daily basis. The palpitations are associated with a discomfort in her neck similar to angina. Symptoms are sporadic, not necessarily induced by exertion. She reports no changes in her medications and remains on high-dose beta blocker. Follow-up ECG noted below shows sinus bradycardia today.  Patient denies any increasing stress or caffeine use.  Cardiac catheterization in March 2014 demonstrated multivessel disease with occluded RCA and LAD, patent circumflex stents, patent LIMA to LAD, patent SVG to PDA, chronically occluded SVG to obtuse marginal, LVEF 50% with inferior hypokinesis. She has been managed medically.  Past Medical History  Diagnosis Date  . Coronary atherosclerosis of native coronary artery     DES circ 10/10, residual 100% occluded SVG to OM, patent LIMA to LAD and SVG to PDA grafts, LVEF 50%  . Hyperlipemia   . Hypertension   . Bradycardia   . Palpitations     Transient PAT/AF, CHADS2 score 1  . Cataracts, bilateral   . GERD (gastroesophageal reflux disease)     Past Surgical History  Procedure Laterality Date  . Abdominal hysterectomy    . Carpal tunnel release    . Coronary artery bypass graft      [facility], SVG to OM, SVG to PDA  . Left heart catheterization with coronary/graft angiogram N/A 12/27/2012    Procedure: LEFT HEART CATHETERIZATION WITH Isabel CapriceORONARY/GRAFT ANGIOGRAM;  Surgeon: Herby Abrahamhomas D  Stuckey, MD;  [facility];  Service: Cardiovascular;  Laterality: N/A;    Current Outpatient Prescriptions  Medication Sig Dispense Refill  . aspirin EC 81 MG tablet Take 1 tablet (81 mg total) by mouth daily.    Marland Kitchen. buPROPion (WELLBUTRIN) 100 MG tablet Take 100 mg by mouth daily as needed (for  severe anxiety).     . clopidogrel (PLAVIX) 75 MG tablet Take 1 tablet (75 mg total) by mouth daily. 30 tablet 1  . hydrALAZINE (APRESOLINE) 50 MG tablet Take 1.5 tablets (75 mg total) by mouth 3 (three) times daily. 405 tablet 3  . hydrochlorothiazide (HYDRODIURIL) 25 MG tablet Take 1 tablet (25 mg total) by mouth daily. 30 tablet 1  . isosorbide mononitrate (IMDUR) 60 MG 24 hr tablet Take 1 tablet (60 mg total) by mouth daily. 30 tablet 1  . losartan (COZAAR) 100 MG tablet Take 1 tablet (100 mg total) by mouth daily. 90 tablet 3  . metoprolol (LOPRESSOR) 100 MG tablet Take 1 tablet (100 mg total) by mouth 2 (two) times daily. 60 tablet 1  . nitroGLYCERIN (NITROSTAT) 0.4 MG SL tablet Place 1 tablet (0.4 mg total) under the tongue every 5 (five) minutes x 3 doses as needed for chest pain. 25 tablet 3  . simvastatin (ZOCOR) 40 MG tablet Take 1 tablet (40 mg total) by mouth at bedtime. 30 tablet 1   No current facility-administered medications for this visit.    Allergies:  Review of patient's allergies indicates no known allergies.  Social History: The patient  reports that she quit smoking about 7 years ago. Her smoking use included Cigarettes. She has never used smokeless tobacco. She reports that she drinks alcohol. She reports that she does not use illicit drugs.    ROS:  Please see the history of present illness. Otherwise, complete review of systems is positive for none.  All other systems are reviewed and negative.    Physical Exam: VS:  BP 155/78 mmHg  Pulse 51  Ht  (1.651 m)  Wt 224 lb 6.4 oz (101.787 kg)  BMI 37.34 kg/m2  SpO2 98%, BMI Body mass index is 37.34  kg/(m^2).  Wt Readings from Last 3 Encounters:  12/25/14 224 lb 6.4 oz (101.787 kg)  04/27/14 219 lb 12.8 oz (99.701 kg)  10/21/13 218 lb (98.884 kg)     General: Patient appears comfortable at rest. HEENT: Conjunctiva and lids normal, oropharynx clear with moist mucosa. Neck: Supple, no elevated JVP or carotid bruits, no thyromegaly. Lungs: Clear to auscultation, nonlabored breathing at rest. Cardiac: Regular rate and rhythm, no S3 or significant systolic murmur, no pericardial rub. Abdomen: Soft, nontender, no hepatomegaly, bowel sounds present, no guarding or rebound. Extremities: No pitting edema, distal pulses 2+. Skin: Warm and dry. Musculoskeletal: No kyphosis. Neuropsychiatric: Alert and oriented x3, affect grossly appropriate.   ECG: ECG is ordered today and reviewed showing sinus bradycardia with nonspecific ST-T abnormalities that are old.   Recent Labwork: No results found for requested labs within last 365 days.     Component Value Date/Time   CHOL 110 12/25/2012 0540   TRIG 157* 12/25/2012 0540   HDL 25* 12/25/2012 0540   CHOLHDL 4.4 12/25/2012 0540   VLDL 31 12/25/2012 0540   LDLCALC 54 12/25/2012 0540    Assessment and Plan:  1. Palpitations and neck discomfort, sporadic, more frequent in the last few weeks. ECG today shows sinus bradycardia. She has a history of PAT/AF in the past. Plan will be to obtain a seven-day cardiac monitor for reevaluation. Continue high-dose beta blocker for now.  2. Multivessel CAD status post CABG, graft anatomy documented above. Continue current medical therapy.  Current medicines are reviewed at length with the patient today.  The patient does not have concerns regarding medicines.    Orders Placed This Encounter  Procedures  . EKG 12-Lead  . Cardiac event monitor    Disposition: Call with results.   Signed, Jonelle Sidle, MD, [facility] 12/25/2014 11:44 AM    [facility] [address] [place], [place], [place] [address] Phone: [phone]; Fax: [phone]

## 2014-12-25 NOTE — Patient Instructions (Signed)
Your physician recommends that you schedule a follow-up appointment in: 6 months. You will receive a reminder letter in the mail in about 4 months reminding you to call and schedule your appointment. If you don't receive this letter, please contact our office. Your physician recommends that you continue on your current medications as directed. Please refer to the Current Medication list given to you today. Your physician has recommended that you wear a 7 day event monitor. Event monitors are medical devices that record the heart's electrical activity. Doctors most often us these monitors to diagnose arrhythmias. Arrhythmias are problems with the speed or rhythm of the heartbeat. The monitor is a small, portable device. You can wear one while you do your normal daily activities. This is usually used to diagnose what is causing palpitations/syncope (passing out).

## 2014-12-27 DIAGNOSIS — R002 Palpitations: Secondary | ICD-10-CM | POA: Diagnosis not present

## 2014-12-29 ENCOUNTER — Ambulatory Visit: Payer: Self-pay | Admitting: Cardiology

## 2015-01-22 ENCOUNTER — Other Ambulatory Visit: Payer: Self-pay | Admitting: *Deleted

## 2015-01-22 MED ORDER — CLOPIDOGREL BISULFATE 75 MG PO TABS: 75.0000 mg | ORAL_TABLET | Freq: Every day | ORAL | Status: DC

## 2015-01-26 ENCOUNTER — Other Ambulatory Visit: Payer: Self-pay | Admitting: Cardiology

## 2015-03-06 ENCOUNTER — Other Ambulatory Visit: Payer: Self-pay | Admitting: Cardiology

## 2015-04-04 ENCOUNTER — Other Ambulatory Visit: Payer: Self-pay | Admitting: Cardiology

## 2015-06-11 ENCOUNTER — Other Ambulatory Visit: Payer: Self-pay | Admitting: Cardiology

## 2015-07-25 ENCOUNTER — Other Ambulatory Visit: Payer: Self-pay | Admitting: *Deleted

## 2015-07-25 MED ORDER — LOSARTAN POTASSIUM 100 MG PO TABS: 100.0000 mg | ORAL_TABLET | Freq: Every day | ORAL | Status: DC

## 2015-08-02 ENCOUNTER — Encounter: Payer: Self-pay | Admitting: Cardiology

## 2015-08-02 ENCOUNTER — Ambulatory Visit (INDEPENDENT_AMBULATORY_CARE_PROVIDER_SITE_OTHER): Payer: Medicare Other | Admitting: Cardiology

## 2015-08-02 VITALS — BP 152/90 | HR 59 | Ht 65.0 in | Wt 215.0 lb

## 2015-08-02 DIAGNOSIS — Z23 Encounter for immunization: Secondary | ICD-10-CM | POA: Diagnosis not present

## 2015-08-02 NOTE — Patient Instructions (Signed)
Your physician recommends that you continue on your current medications as directed. Please refer to the Current Medication list given to you today. Your physician recommends that you schedule a follow-up appointment in: 6 months. You will receive a reminder letter in the mail in about 4 months reminding you to call and schedule your appointment. If you don't receive this letter, please contact our office. 

## 2015-08-02 NOTE — Progress Notes (Signed)
Cardiology Office Note  Date: 08/02/2015   ID: Heather Livingston, DOB [birth year], MRN [id number]  PCP: Theodoro Kos, MD  Primary Cardiologist: Nona Dell, MD   Chief Complaint  Patient presents with  . Coronary Artery Disease  . Palpitations    History of Present Illness: Heather Livingston is a 64 y.o. female last seen in March.she presents for a routine cardiac visit. She does not report any angina symptomsor nitroglycerin use. Still has an intermittent sense of palpitations that she feels in her neck, no syncope.  Cardiac monitor from April showed sinus rhythm with rare PACs, no atrial fibrillation documented.we discussed these results today.  Cardiac medications are stable and reviewed below. She reports no bleeding problems on aspirin and Plavix. She also remains on high-dose metoprolol.  She continues to follow with Dr. Melvyn Neth for primary care. Lipids have been well controlled over time, she continues on Zocor.  She also reports being under a lot under a lot of stress due to family concerns.   Past Medical History  Diagnosis Date  . Coronary atherosclerosis of native coronary artery     DES circ 10/10, residual 100% occluded SVG to OM, patent LIMA to LAD and SVG to PDA grafts, LVEF 50%  . Hyperlipemia   . Hypertension   . Bradycardia   . Palpitations     Transient PAT/AF, CHADS2 score 1  . Cataracts, bilateral   . GERD (gastroesophageal reflux disease)     Past Surgical History  Procedure Laterality Date  . Abdominal hysterectomy    . Carpal tunnel release    . Coronary artery bypass graft      [facility], LIMA to LAD, SVG to OM, SVG to PDA  . Left heart catheterization with coronary/graft angiogram N/A 12/27/2012    Procedure: LEFT HEART CATHETERIZATION WITH Isabel Caprice;  Surgeon: Herby Abraham, MD;  Location: [facility] CATH LAB;  Service: Cardiovascular;  Laterality: N/A;    Current Outpatient Prescriptions  Medication Sig Dispense  Refill  . aspirin EC 81 MG tablet Take 1 tablet (81 mg total) by mouth daily.    Marland Kitchen buPROPion (WELLBUTRIN) 100 MG tablet Take 100 mg by mouth 2 (two) times daily.     . clopidogrel (PLAVIX) 75 MG tablet Take 1 tablet (75 mg total) by mouth daily. 30 tablet 6  . hydrALAZINE (APRESOLINE) 50 MG tablet Take 1.5 tablets (75 mg total) by mouth 3 (three) times daily. 405 tablet 3  . hydrochlorothiazide (HYDRODIURIL) 25 MG tablet TAKE 1 TABLET BY MOUTH EVERY DAY 90 tablet 3  . ibuprofen (ADVIL,MOTRIN) 800 MG tablet Take 800 mg by mouth every 8 (eight) hours as needed.    . isosorbide mononitrate (IMDUR) 60 MG 24 hr tablet TAKE 1 TABLET BY MOUTH DAILY. 30 tablet 6  . losartan (COZAAR) 100 MG tablet Take 1 tablet (100 mg total) by mouth daily. 90 tablet 3  . metoprolol (LOPRESSOR) 100 MG tablet TAKE 1 TABLET BY MOUTH 2 TIMES DAILY. 60 tablet 6  . nitroGLYCERIN (NITROSTAT) 0.4 MG SL tablet Place 1 tablet (0.4 mg total) under the tongue every 5 (five) minutes x 3 doses as needed for chest pain. 25 tablet 3  . ranitidine (ZANTAC) 300 MG tablet Take 300 mg by mouth at bedtime.    . simvastatin (ZOCOR) 40 MG tablet TAKE 1 TABLET BY MOUTH AT BEDTIME. 30 tablet 6  . traMADol (ULTRAM) 50 MG tablet Take 50 mg by mouth every 6 (six) hours as needed.  No current facility-administered medications for this visit.    Allergies:  Review of patient's allergies indicates no known allergies.   Social History: The patient  reports that she quit smoking about 7 years ago. Her smoking use included Cigarettes. She has never used smokeless tobacco. She reports that she drinks alcohol. She reports that she does not use illicit drugs.   ROS:  Please see the history of present illness. Otherwise, complete review of systems is positive for right hip pain with ambulation.  All other systems are reviewed and negative.   Physical Exam: VS:  BP 152/90 mmHg  Pulse 59  Ht 5\' 5"  (1.651 m)  Wt 215 lb (97.523 kg)  BMI 35.78 kg/m2   SpO2 98%, BMI Body mass index is 35.78 kg/(m^2).  Wt Readings from Last 3 Encounters:  08/02/15 215 lb (97.523 kg)  12/25/14 224 lb 6.4 oz (101.787 kg)  04/27/14 219 lb 12.8 oz (99.701 kg)     General: Overweight woman, appears comfortable at rest. HEENT: Conjunctiva and lids normal, oropharynx clear. Neck: Supple, no elevated JVP or carotid bruits, no thyromegaly. Lungs: Clear to auscultation, nonlabored breathing at rest. Cardiac: Regular rate and rhythm, no S3 or significant systolic murmur, no pericardial rub. Abdomen: Soft, nontender, bowel sounds present. Extremities: No pitting edema, distal pulses 2+. Skin: Warm and dry. Musculoskeletal: No kyphosis. Neuropsychiatric: Alert and oriented x3, affect grossly appropriate.   ECG: ECG is not ordered today.   Recent Labwork:  February 2016: Potassium 4.1, BUN 12, creatinine 0.18 May 2013: Cholesterol 127, triglycerides 143, HDL 31, LDL 66  Other Studies Reviewed Today:  Cardiac catheterization in March 2014 demonstrated multivessel disease with occluded RCA and LAD, patent circumflex stents, patent LIMA to LAD, patent SVG to PDA, chronically occluded SVG to obtuse marginal, LVEF 50% with inferior hypokinesis.  Assessment and Plan:  1. CAD status post CABG with known occlusion of the SVG to OM, status post DES to the circumflex. Revascularization was intact as of cardiac catheterization in 2014. She remains symptomatically stable on medical therapy, and we will continue observation.  2. Essential hypertension, blood pressure is elevated today. She reports compliance with her medications. We discussed sodium restriction and exercise, also stress reduction.  3. Hyperlipidemia, has been fairly well controlled over time on Zocor.  4. Palpitations, no significant change in pattern. Monitoring done earlier this year did not demonstrate any obvious arrhythmias, she had occasional PACs.  Current medicines were reviewed with the  patient today.   Orders Placed This Encounter  Procedures  . Flu Vaccine QUAD 36+ mos IM    Disposition: FU with me in 6 months.   Signed, Jonelle SidleSamuel G. Angelys Yetman, MD, [facility] 08/02/2015 9:01 AM    [facility] [address], [place], [place] [address] Phone: [phone]; Fax: [phone]

## 2015-09-09 ENCOUNTER — Other Ambulatory Visit: Payer: Self-pay | Admitting: Cardiology

## 2015-09-10 ENCOUNTER — Other Ambulatory Visit: Payer: Self-pay | Admitting: *Deleted

## 2015-09-10 MED ORDER — CLOPIDOGREL BISULFATE 75 MG PO TABS: 75.0000 mg | ORAL_TABLET | Freq: Every day | ORAL | Status: DC

## 2015-09-16 ENCOUNTER — Other Ambulatory Visit: Payer: Self-pay | Admitting: Cardiology

## 2015-10-06 ENCOUNTER — Other Ambulatory Visit: Payer: Self-pay | Admitting: Cardiology

## 2015-10-16 ENCOUNTER — Other Ambulatory Visit: Payer: Self-pay | Admitting: *Deleted

## 2015-10-16 MED ORDER — HYDRALAZINE HCL 50 MG PO TABS: 75.0000 mg | ORAL_TABLET | Freq: Three times a day (TID) | ORAL | Status: DC

## 2016-02-01 ENCOUNTER — Ambulatory Visit: Payer: Medicare Other | Admitting: Cardiology

## 2016-02-05 ENCOUNTER — Ambulatory Visit (INDEPENDENT_AMBULATORY_CARE_PROVIDER_SITE_OTHER): Payer: Medicare Other | Admitting: Cardiology

## 2016-02-05 ENCOUNTER — Encounter: Payer: Self-pay | Admitting: Cardiology

## 2016-02-05 VITALS — BP 178/88 | HR 51 | Ht 65.0 in | Wt 208.0 lb

## 2016-02-05 DIAGNOSIS — I2510 Atherosclerotic heart disease of native coronary artery without angina pectoris: Secondary | ICD-10-CM

## 2016-02-05 DIAGNOSIS — E785 Hyperlipidemia, unspecified: Secondary | ICD-10-CM | POA: Diagnosis not present

## 2016-02-05 DIAGNOSIS — R002 Palpitations: Secondary | ICD-10-CM | POA: Diagnosis not present

## 2016-02-05 DIAGNOSIS — I10 Essential (primary) hypertension: Secondary | ICD-10-CM | POA: Diagnosis not present

## 2016-02-05 MED ORDER — NITROGLYCERIN 0.4 MG SL SUBL: 0.4000 mg | SUBLINGUAL_TABLET | SUBLINGUAL | Status: DC | PRN

## 2016-02-05 NOTE — Progress Notes (Signed)
Cardiology Office Note  Date: 02/05/2016   ID: Heather Livingston, DOB 1951/04/20, MRN [id number]  PCP: Theodoro Kos, MD  Primary Cardiologist: Nona Dell, MD   Chief Complaint  Patient presents with  . Coronary Artery Disease    History of Present Illness: Heather Livingston is a 65 y.o. female last seen in November 2016. She presents for a routine follow-up visit. States that she remains under a lot of stress with family matters. She has not been sleeping well, has been fatigued. Tries to walk for exercise when she can. Fortunately, not reporting any angina symptoms or nitroglycerin use.  Labwork from August 2016 is outlined below. Lipids have been aggressively managed. I reviewed her medications. Cardiac regimen includes aspirin, Plavix, hydralazine, HCTZ, Imdur, Cozaar, Lopressor, Zocor, and as needed nitroglycerin.  I reviewed her ECG today which shows sinus bradycardia with PACs and IVCD.  Her last cardiac catheterization was within the last 3 years, as summarized below.  Past Medical History  Diagnosis Date  . Coronary atherosclerosis of native coronary artery     DES circ 10/10, residual 100% occluded SVG to OM, patent LIMA to LAD and SVG to PDA grafts, LVEF 50%  . Hyperlipemia   . Hypertension   . Bradycardia   . Palpitations     Transient PAT/AF, CHADS2 score 1  . Cataracts, bilateral   . GERD (gastroesophageal reflux disease)   . OSA (obstructive sleep apnea)     On CPAP    Past Surgical History  Procedure Laterality Date  . Abdominal hysterectomy    . Carpal tunnel release    . Coronary artery bypass graft      [facility], LIMA to LAD, SVG to OM, SVG to PDA  . Left heart catheterization with coronary/graft angiogram N/A 12/27/2012    Procedure: LEFT HEART CATHETERIZATION WITH Isabel Caprice;  Surgeon: Herby Abraham, MD;  Location: [facility] CATH LAB;  Service: Cardiovascular;  Laterality: N/A;    Current Outpatient Prescriptions    Medication Sig Dispense Refill  . aspirin EC 81 MG tablet Take 1 tablet (81 mg total) by mouth daily.    Marland Kitchen buPROPion (WELLBUTRIN) 100 MG tablet Take 100 mg by mouth 2 (two) times daily.     . clopidogrel (PLAVIX) 75 MG tablet Take 1 tablet (75 mg total) by mouth daily. 30 tablet 6  . hydrALAZINE (APRESOLINE) 50 MG tablet Take 1.5 tablets (75 mg total) by mouth 3 (three) times daily. 405 tablet 3  . hydrochlorothiazide (HYDRODIURIL) 25 MG tablet TAKE 1 TABLET BY MOUTH EVERY DAY 90 tablet 3  . ibuprofen (ADVIL,MOTRIN) 800 MG tablet Take 800 mg by mouth every 8 (eight) hours as needed.    . isosorbide mononitrate (IMDUR) 60 MG 24 hr tablet TAKE 1 TABLET BY MOUTH DAILY. 30 tablet 5  . losartan (COZAAR) 100 MG tablet Take 1 tablet (100 mg total) by mouth daily. 90 tablet 3  . metoprolol (LOPRESSOR) 100 MG tablet TAKE 1 TABLET BY MOUTH 2 TIMES DAILY. 60 tablet 6  . nitroGLYCERIN (NITROSTAT) 0.4 MG SL tablet Place 1 tablet (0.4 mg total) under the tongue every 5 (five) minutes x 3 doses as needed for chest pain. 25 tablet 3  . NON FORMULARY CPAP MACHINE NIGHTLY    . ranitidine (ZANTAC) 300 MG tablet Take 300 mg by mouth at bedtime.    . simvastatin (ZOCOR) 40 MG tablet TAKE 1 TABLET BY MOUTH AT BEDTIME. 30 tablet 6  . traMADol (ULTRAM) 50 MG  tablet Take 50 mg by mouth every 6 (six) hours as needed.     No current facility-administered medications for this visit.   Allergies:  Review of patient's allergies indicates no known allergies.   Social History: The patient  reports that she quit smoking about 8 years ago. Her smoking use included Cigarettes. She has never used smokeless tobacco. She reports that she drinks alcohol. She reports that she does not use illicit drugs.   ROS:  Please see the history of present illness. Otherwise, complete review of systems is positive for arthritic pains.  All other systems are reviewed and negative.   Physical Exam: VS:  BP 178/88 mmHg  Pulse 51  Ht 5\' 5"   (1.651 m)  Wt 208 lb (94.348 kg)  BMI 34.61 kg/m2  SpO2 97%, BMI Body mass index is 34.61 kg/(m^2).  Wt Readings from Last 3 Encounters:  02/05/16 208 lb (94.348 kg)  08/02/15 215 lb (97.523 kg)  12/25/14 224 lb 6.4 oz (101.787 kg)    General: Overweight woman, appears comfortable at rest. HEENT: Conjunctiva and lids normal, oropharynx clear. Neck: Supple, no elevated JVP or carotid bruits, no thyromegaly. Lungs: Clear to auscultation, nonlabored breathing at rest. Cardiac: Regular rate and rhythm, no S3 or significant systolic murmur, no pericardial rub. Abdomen: Soft, nontender, bowel sounds present. Extremities: No pitting edema, distal pulses 2+. Skin: Warm and dry. Musculoskeletal: No kyphosis. Neuropsychiatric: Alert and oriented x3, affect grossly appropriate.  ECG: I personally reviewed the prior tracing from 12/25/2014 which showed sinus bradycardia with diffuse nonspecific ST-T abnormalities.  Recent Labwork:    Component Value Date/Time   CHOL 110 12/25/2012 0540   TRIG 157* 12/25/2012 0540   HDL 25* 12/25/2012 0540   CHOLHDL 4.4 12/25/2012 0540   VLDL 31 12/25/2012 0540   LDLCALC 54 12/25/2012 0540  August 2016: Cholesterol 93, triglycerides 90, HDL 29, LDL 46, AST 22, ALT 23, potassium 4.0, BUN 14, creatinine 0.6, hemoglobin 15.7, platelets 204   Other Studies Reviewed Today:  Cardiac catheterization in March 2014 demonstrated multivessel disease with occluded RCA and LAD, patent circumflex stents, patent LIMA to LAD, patent SVG to PDA, chronically occluded SVG to obtuse marginal, LVEF 50% with inferior hypokinesis.  Assessment and Plan:  1. Multivessel CAD status post CABG with subsequently documented graft disease, most recently status post DES to the circumflex in 2010. She remains symptomatically stable on medical therapy. ECG reviewed. No changes were made today.  2. Hyperlipidemia, and is on Zocor. Recent LDL 46.  3. Essential hypertension, blood pressure  elevated today. She reports compliance with her medications. Discussed stress reduction, sodium restriction. Keep follow-up with Dr. Melvyn NethLewis. No new medications were added.  4. Asymptomatic sinus bradycardia. History of palpitations which have been quiescent recently.  Current medicines were reviewed with the patient today.   Orders Placed This Encounter  Procedures  . EKG 12-Lead    Disposition: FU with me in 6 months.   Signed, Jonelle SidleSamuel G. Cambrie Sonnenfeld, MD, [facility] DistrictFACC 02/05/2016 9:43 AM    [facility] [address], [place], [place] [address] Phone: [phone]; Fax: [phone]

## 2016-02-05 NOTE — Patient Instructions (Signed)
Continue all current medications. Your physician wants you to follow up in: 6 months.  You will receive a reminder letter in the mail one-two months in advance.  If you don't receive a letter, please call our office to schedule the follow up appointment   

## 2016-03-02 ENCOUNTER — Other Ambulatory Visit: Payer: Self-pay | Admitting: Cardiology

## 2016-03-19 ENCOUNTER — Other Ambulatory Visit: Payer: Self-pay | Admitting: *Deleted

## 2016-03-19 MED ORDER — CLOPIDOGREL BISULFATE 75 MG PO TABS: 75.0000 mg | ORAL_TABLET | Freq: Every day | ORAL | Status: DC

## 2016-03-23 ENCOUNTER — Other Ambulatory Visit: Payer: Self-pay | Admitting: Cardiology

## 2016-04-13 ENCOUNTER — Other Ambulatory Visit: Payer: Self-pay | Admitting: Cardiology

## 2016-05-15 ENCOUNTER — Other Ambulatory Visit: Payer: Self-pay | Admitting: Cardiology

## 2016-08-07 ENCOUNTER — Other Ambulatory Visit: Payer: Self-pay | Admitting: *Deleted

## 2016-08-07 MED ORDER — LOSARTAN POTASSIUM 100 MG PO TABS: 100.0000 mg | ORAL_TABLET | Freq: Every day | ORAL | 3 refills | Status: DC

## 2016-08-12 ENCOUNTER — Encounter: Payer: Self-pay | Admitting: *Deleted

## 2016-08-12 NOTE — Progress Notes (Signed)
Cardiology Office Note  Date: 08/13/2016   ID: Heather Livingston, DOB 01/07/1951, MRN [id number]  PCP: Theodoro KosLEWIS,WILLIAM B, MD  Primary Cardiologist: Nona DellSamuel McDowell, MD   Chief Complaint  Patient presents with  . Coronary Artery Disease  . Hypertension    History of Present Illness: Heather Standardrena Tito DineJanice Kil is a 65 y.o. female last seen in April. She presents for a routine follow-up visit. Reports no angina symptoms or nitroglycerin use was typical activities. She has NYHA class II dyspnea at baseline. Intermittent chest congestion and coughing, she still reports use of CPAP at nighttime.  I reviewed her medications. She reports compliance except that she has been taking her hydralazine twice a day. Blood pressure is elevated, we discussed increasing hydralazine to 3 times a day.  I reviewed her most recent lab work which is outlined below.  She denies any significant palpitations since last encounter. Remains on high-dose beta blocker.  Past Medical History:  Diagnosis Date  . Bradycardia   . Cataracts, bilateral   . Coronary atherosclerosis of native coronary artery    DES circ 10/10, residual 100% occluded SVG to OM, patent LIMA to LAD and SVG to PDA grafts, LVEF 50%  . GERD (gastroesophageal reflux disease)   . Hyperlipemia   . Hypertension   . OSA (obstructive sleep apnea)    On CPAP  . Palpitations    Transient PAT/AF, CHADS2 score 1    Past Surgical History:  Procedure Laterality Date  . ABDOMINAL HYSTERECTOMY    . CARPAL TUNNEL RELEASE    . CORONARY ARTERY [facility]PASS GRAFT     [facility] LIMA to LAD, SVG to OM, SVG to PDA  . LEFT HEART CATHETERIZATION WITH CORONARY/GRAFT ANGIOGRAM N/A 12/27/2012   Procedure: LEFT HEART CATHETERIZATION WITH Isabel CapriceORONARY/GRAFT ANGIOGRAM;  Surgeon: Herby Abrahamhomas D Stuckey, MD;  [facility];  Service: Cardiovascular;  Laterality: N/A;    Current Outpatient Prescriptions  Medication Sig Dispense Refill  . aspirin EC 81 MG tablet Take 1  tablet (81 mg total) by mouth daily.    Marland Kitchen. buPROPion (WELLBUTRIN) 100 MG tablet Take 100 mg by mouth 2 (two) times daily.     . clopidogrel (PLAVIX) 75 MG tablet Take 1 tablet (75 mg total) by mouth daily. 30 tablet 6  . fluticasone (FLONASE) 50 MCG/ACT nasal spray Place 2 sprays into both nostrils daily.  3  . hydrALAZINE (APRESOLINE) 50 MG tablet Take 1.5 tablets (75 mg total) by mouth 3 (three) times daily. 405 tablet 3  . hydrochlorothiazide (HYDRODIURIL) 25 MG tablet TAKE 1 TABLET BY MOUTH EVERY DAY 90 tablet 3  . ibuprofen (ADVIL,MOTRIN) 800 MG tablet Take 800 mg by mouth every 8 (eight) hours as needed.    . isosorbide mononitrate (IMDUR) 60 MG 24 hr tablet TAKE 1 TABLET BY MOUTH DAILY. 30 tablet 6  . losartan (COZAAR) 100 MG tablet Take 1 tablet (100 mg total) by mouth daily. 90 tablet 3  . magnesium oxide (MAG-OX) 400 (241.3 Mg) MG tablet Take 1 tablet by mouth daily.  3  . metoprolol (LOPRESSOR) 100 MG tablet TAKE 1 TABLET BY MOUTH 2 TIMES DAILY. 60 tablet 6  . nitroGLYCERIN (NITROSTAT) 0.4 MG SL tablet Place 1 tablet (0.4 mg total) under the tongue every 5 (five) minutes x 3 doses as needed for chest pain. 25 tablet 3  . NON FORMULARY CPAP MACHINE NIGHTLY    . PROAIR RESPICLICK 108 (90 Base) MCG/ACT AEPB Inhale 2 puffs into the lungs every 4 (  four) hours as needed.  5  . ranitidine (ZANTAC) 300 MG tablet Take 300 mg by mouth at bedtime.    . simvastatin (ZOCOR) 40 MG tablet TAKE 1 TABLET BY MOUTH AT BEDTIME. 30 tablet 6  . traMADol (ULTRAM) 50 MG tablet Take 50-100 mg by mouth every 12 (twelve) hours as needed.      No current facility-administered medications for this visit.    Allergies:  Review of patient's allergies indicates no known allergies.   Social History: The patient  reports that she quit smoking about 8 years ago. Her smoking use included Cigarettes. She started smoking about 50 years ago. She has a 32.25 pack-year smoking history. She has never used smokeless tobacco.  She reports that she drinks alcohol. She reports that she does not use drugs.   ROS:  Please see the history of present illness. Otherwise, complete review of systems is positive for emotional stress.  All other systems are reviewed and negative.   Physical Exam: VS:  BP (!) 174/97 (BP Location: Right Arm, Cuff Size: Large)   Pulse (!) 55   Ht 5\' 5"  (1.651 m)   Wt 205 lb (93 kg)   BMI 34.11 kg/m , BMI Body mass index is 34.11 kg/m.  Wt Readings from Last 3 Encounters:  08/13/16 205 lb (93 kg)  02/05/16 208 lb (94.3 kg)  08/02/15 215 lb (97.5 kg)    General: Overweight woman, appears comfortable at rest. HEENT: Conjunctiva and lids normal, oropharynx clear. Neck: Supple, no elevated JVP or carotid bruits, no thyromegaly. Lungs: Clear to auscultation, nonlabored breathing at rest. Cardiac: Regular rate and rhythm, no S3 or significant systolic murmur, no pericardial rub. Abdomen: Soft, nontender, bowel sounds present. Extremities: No pitting edema, distal pulses 2+. Skin: Warm and dry. Musculoskeletal: No kyphosis. Neuropsychiatric: Alert and oriented x3, affect grossly appropriate.  ECG: I personally reviewed the tracing from 02/05/2016 which showed sinus bradycardia with IVCD, PAC, and nonspecific ST-T abnormalities.  Recent Labwork:  August 2016: Cholesterol 93, triglycerides 90, HDL 29, LDL 46, AST 22, ALT 23, potassium 4.0, BUN 14, creatinine 0.6, hemoglobin 15.7, platelets 204 August 2017: Potassium 3.9, BUN 16, creatinine 0.71, hemoglobin 15.2, platelets 216, hemoglobin A1c 5.6, AST 19, ALT 24, cholesterol 97, triglycerides 162, HDL 27, LDL 37, TSH 1.79  Other Studies Reviewed Today:  Cardiac catheterization 3/70/2014: Coronary angiography: Coronary dominance: right  Left mainstem: Long, calcified vessel, distal 40-50% stenosis  Left anterior descending (LAD): Total occlusion proximally. Fills from the LIMA.  Left circumflex (LCx): Tortuous, patent vessel. Large  intermediate branch is patent. Proximal and mid-vessel stents are patent without significant ISR.  Right coronary artery (RCA): 100% proximal stenosis (total occlusion)  SVG - PDA: patent with mild diffuse irregularity. PDA is patent, PLA is patent  LIMA - LAD: widely patent. Distal anastomosis is patent  Left ventriculography: Left ventricular systolic function is low normal. There is mild inferior wall hypokinesis. LVEF is estimated at 50%.  Abdominal aortography: no aneurysm. Diffuse irregularity of the infrarenal aorta. Renal arteries are patent. Diffuse iliac disease but iliacs are poorly visualized.  Final Conclusions:   1. Severe 3 vessel CAD with total occlusion of the RCA, patent stents in the LCx, and total occlusion of the LAD 2. Continued patency of the LIMA to LAD and SVG to PDA. 3. Known occlusion of the SVG to OM 4. Mild LV dysfunction with preserved LVEF  Assessment and Plan:  1. Symptomatically stable multivessel CAD status post CABG. She has documented  graft disease with occluded SVG to OM and prior DES to the circumflex in 2010 which was found to be patent as of 2014. In light of stable symptoms we will continue with medical therapy and observation for now.  2. Essential hypertension, blood pressure is not optimally controlled. As outlined above I asked her to increase hydralazine to 3 times a day dosing. This can be further up titrated if needed.  3. History of palpitations, quiescent on high-dose Lopressor.  4. Hyperlipidemia, remains on Zocor with recent LDL 37.  5. OSA on CPAP.  Current medicines were reviewed with the patient today.  Disposition: Follow-up with me in 6 months.   Signed, Jonelle Sidle, MD, [facility] 08/13/2016 8:05 AM    [facility] at [facility] [address] [place], [place], [place] [address] Phone: [phone]; Fax: [phone]

## 2016-08-13 ENCOUNTER — Ambulatory Visit (INDEPENDENT_AMBULATORY_CARE_PROVIDER_SITE_OTHER): Payer: Medicare Other | Admitting: Cardiology

## 2016-08-13 ENCOUNTER — Encounter: Payer: Self-pay | Admitting: Cardiology

## 2016-08-13 VITALS — BP 174/97 | HR 55 | Ht 65.0 in | Wt 205.0 lb

## 2016-08-13 DIAGNOSIS — I10 Essential (primary) hypertension: Secondary | ICD-10-CM

## 2016-08-13 DIAGNOSIS — G4733 Obstructive sleep apnea (adult) (pediatric): Secondary | ICD-10-CM

## 2016-08-13 DIAGNOSIS — Z9989 Dependence on other enabling machines and devices: Secondary | ICD-10-CM

## 2016-08-13 DIAGNOSIS — E782 Mixed hyperlipidemia: Secondary | ICD-10-CM

## 2016-08-13 DIAGNOSIS — I2510 Atherosclerotic heart disease of native coronary artery without angina pectoris: Secondary | ICD-10-CM

## 2016-08-13 DIAGNOSIS — R002 Palpitations: Secondary | ICD-10-CM

## 2016-08-13 NOTE — Patient Instructions (Signed)
Medication Instructions:   Please try to do your Hydralazine three times per day as prescribed.  Continue all other medications.    Labwork: none  Testing/Procedures: none  Follow-Up: Your physician wants you to follow up in: 6 months.  You will receive a reminder letter in the mail one-two months in advance.  If you don't receive a letter, please call our office to schedule the follow up appointment   Any Other Special Instructions Will Be Listed Below (If Applicable).  If you need a refill on your cardiac medications before your next appointment, please call your pharmacy.

## 2016-09-14 ENCOUNTER — Other Ambulatory Visit: Payer: Self-pay | Admitting: Cardiology

## 2016-09-25 ENCOUNTER — Other Ambulatory Visit: Payer: Self-pay | Admitting: *Deleted

## 2016-09-25 MED ORDER — HYDRALAZINE HCL 50 MG PO TABS: 75.0000 mg | ORAL_TABLET | Freq: Three times a day (TID) | ORAL | 3 refills | Status: DC

## 2016-10-02 ENCOUNTER — Other Ambulatory Visit: Payer: Self-pay | Admitting: Cardiology

## 2016-11-29 ENCOUNTER — Other Ambulatory Visit: Payer: Self-pay | Admitting: Cardiology

## 2017-02-03 ENCOUNTER — Other Ambulatory Visit: Payer: Self-pay | Admitting: *Deleted

## 2017-02-03 MED ORDER — METOPROLOL TARTRATE 100 MG PO TABS: 100.0000 mg | ORAL_TABLET | Freq: Two times a day (BID) | ORAL | 1 refills | Status: DC

## 2017-02-03 MED ORDER — ISOSORBIDE MONONITRATE ER 60 MG PO TB24: 60.0000 mg | ORAL_TABLET | Freq: Every day | ORAL | 1 refills | Status: DC

## 2017-02-03 MED ORDER — SIMVASTATIN 40 MG PO TABS: 40.0000 mg | ORAL_TABLET | Freq: Every day | ORAL | 1 refills | Status: DC

## 2017-02-03 MED ORDER — CLOPIDOGREL BISULFATE 75 MG PO TABS
75.0000 mg | ORAL_TABLET | Freq: Every day | ORAL | 1 refills | Status: DC
Start: 2017-02-03 — End: 2017-08-06

## 2017-02-12 NOTE — Progress Notes (Signed)
Cardiology Office Note  Date: 02/13/2017   ID: [name] Heather Livingston, DOB 06/07/1951, MRN [id number]  PCP: Theodoro KosLEWIS,WILLIAM B, MD  Primary Cardiologist: Nona DellSamuel McDowell, MD   Chief Complaint  Patient presents with  . Coronary Artery Disease    History of Present Illness: Revonda Standardrena Tito DineJanice Barritt is a 66 y.o. female last seen in November 2017. She presents for a routine follow-up visit. Reports no change in chronic dyspnea on exertion, no angina symptoms to require nitroglycerin. She continues to follow with Dr. Melvyn NethLewis for primary care.  Recent lab work from February is outlined below.  I personally reviewed her ECG today which shows sinus rhythm with PVCs, probable old inferior infarct pattern, nonspecific ST-T changes. Present cardiac regimen includes aspirin, Plavix, hydralazine, HCTZ, Imdur, Cozaar, Lopressor, and as needed nitroglycerin. She is also on Zocor for lipid management, recent LDL 70.  Past Medical History:  Diagnosis Date  . Bradycardia   . Cataracts, bilateral   . Coronary atherosclerosis of native coronary artery    DES circ 10/10, residual 100% occluded SVG to OM, patent LIMA to LAD and SVG to PDA grafts, LVEF 50%  . GERD (gastroesophageal reflux disease)   . Hyperlipemia   . Hypertension   . OSA (obstructive sleep apnea)    On CPAP  . Palpitations    Transient PAT/AF, CHADS2 score 1    Past Surgical History:  Procedure Laterality Date  . ABDOMINAL HYSTERECTOMY    . CARPAL TUNNEL RELEASE    . [facility]     [facility] to LAD, SVG to OM, SVG to PDA  . LEFT HEART CATHETERIZATION WITH CORONARY/GRAFT ANGIOGRAM N/A 12/27/2012   Procedure: LEFT HEART CATHETERIZATION WITH Isabel CapriceORONARY/GRAFT ANGIOGRAM;  Surgeon: Herby Abrahamhomas D Stuckey, MD;  [facility];  Service: Cardiovascular;  Laterality: N/A;    Current Outpatient Prescriptions  Medication Sig Dispense Refill  . aspirin EC 81 MG tablet Take 1 tablet (81 mg total) by mouth daily.    .  clopidogrel (PLAVIX) 75 MG tablet Take 1 tablet (75 mg total) by mouth daily. 90 tablet 1  . fluticasone (FLONASE) 50 MCG/ACT nasal spray Place 2 sprays into both nostrils daily.  3  . hydrALAZINE (APRESOLINE) 50 MG tablet Take 1.5 tablets (75 mg total) by mouth 3 (three) times daily. 405 tablet 3  . hydrochlorothiazide (HYDRODIURIL) 25 MG tablet TAKE 1 TABLET BY MOUTH EVERY DAY 90 tablet 3  . HYDROcodone-acetaminophen (NORCO) 7.5-325 MG tablet Take 1 tablet by mouth every 6 (six) hours as needed for moderate pain.    Marland Kitchen. ibuprofen (ADVIL,MOTRIN) 800 MG tablet Take 800 mg by mouth every 8 (eight) hours as needed.    . isosorbide mononitrate (IMDUR) 60 MG 24 hr tablet Take 1 tablet (60 mg total) by mouth daily. 90 tablet 1  . losartan (COZAAR) 100 MG tablet Take 1 tablet (100 mg total) by mouth daily. 90 tablet 3  . magnesium oxide (MAG-OX) 400 (241.3 Mg) MG tablet Take 1 tablet by mouth daily.  3  . metoprolol (LOPRESSOR) 100 MG tablet Take 1 tablet (100 mg total) by mouth 2 (two) times daily. 180 tablet 1  . nitroGLYCERIN (NITROSTAT) 0.4 MG SL tablet Place 1 tablet (0.4 mg total) under the tongue every 5 (five) minutes x 3 doses as needed for chest pain. 25 tablet 3  . NON FORMULARY CPAP MACHINE NIGHTLY    . PROAIR RESPICLICK 108 (90 Base) MCG/ACT AEPB Inhale 2 puffs into the lungs every 4 (  four) hours as needed.  5  . ranitidine (ZANTAC) 300 MG tablet Take 300 mg by mouth at bedtime.    . simvastatin (ZOCOR) 40 MG tablet Take 1 tablet (40 mg total) by mouth at bedtime. 90 tablet 1   No current facility-administered medications for this visit.    Allergies:  Patient has no known allergies.   Social History: The patient  reports that she quit smoking about 9 years ago. Her smoking use included Cigarettes. She started smoking about 51 years ago. She has a 32.25 pack-year smoking history. She has never used smokeless tobacco. She reports that she drinks alcohol. She reports that she does not use  drugs.   ROS:  Please see the history of present illness. Otherwise, complete review of systems is positive for arthritic stiffness.  All other systems are reviewed and negative.   Physical Exam: VS:  BP (!) 160/92   Pulse 62   Ht 5\' 5"  (1.651 m)   Wt 207 lb (93.9 kg)   SpO2 92%   BMI 34.45 kg/m , BMI Body mass index is 34.45 kg/m.  Wt Readings from Last 3 Encounters:  02/13/17 207 lb (93.9 kg)  08/13/16 205 lb (93 kg)  02/05/16 208 lb (94.3 kg)    General: Overweight woman, appears comfortable at rest. HEENT: Conjunctiva and lids normal, oropharynx clear. Neck: Supple, no elevated JVP or carotid bruits, no thyromegaly. Lungs: Clear to auscultation, nonlabored breathing at rest. Cardiac: Regular rate and rhythm, no S3 or significant systolic murmur, no pericardial rub. Abdomen: Soft, nontender, bowel sounds present. Extremities: No pitting edema, distal pulses 2+. Skin: Warm and dry. Musculoskeletal: No kyphosis. Neuropsychiatric: Alert and oriented x3, affect grossly appropriate.  ECG: I personally reviewed the tracing from 02/05/2016 which showed sinus bradycardia with IVCD, PAC, and nonspecific ST-T abnormalities.  Recent Labwork:  February 2018: Hemoglobin 15.7, platelets 204, potassium 4.2, BUN 19, creatinine 0.6, hemoglobin A1c 5.6, AST 27, ALT 29, cholesterol 127, triglycerides 109, HDL 35, LDL 70, TSH 2.96  Other Studies Reviewed Today:  Cardiac catheterization 3/70/2014: Coronary angiography: Coronary dominance: right  Left mainstem:Long, calcified vessel, distal 40-50% stenosis  Left anterior descending (LAD):Total occlusion proximally. Fills from the LIMA.  Left circumflex (LCx):Tortuous, patent vessel. Large intermediate branch is patent. Proximal and mid-vessel stents are patent without significant ISR.  Right coronary artery (RCA):100% proximal stenosis (total occlusion)  SVG - PDA: patent with mild diffuse irregularity. PDA is patent, PLA is  patent  LIMA - LAD: widely patent. Distal anastomosis is patent  Left ventriculography: Left ventricular systolic function is low normal. There is mild inferior wall hypokinesis. LVEF is estimated at 50%.  Abdominal aortography: no aneurysm. Diffuse irregularity of the infrarenal aorta. Renal arteries are patent. Diffuse iliac disease but iliacs are poorly visualized.  Final Conclusions:  1. Severe 3 vessel CAD with total occlusion of the RCA, patent stents in the LCx, and total occlusion of the LAD 2. Continued patency of the LIMA to LAD and SVG to PDA. 3. Known occlusion of the SVG to OM 4. Mild LV dysfunction with preserved LVEF  Assessment and Plan:  1. Symptomatically stable CAD status post DES to the circumflex with occluded vein graft to the obtuse marginal system, patent LIMA to LAD and SVG to PDA. ECG reviewed and stable. Plan to continue current medical therapy and observation in the absence of progressing angina symptoms.  2. Hyperlipidemia, recent LDL 70 on Zocor.  3. Essential hypertension, blood pressure is elevated today. She reports  compliance with her medications. Discussed weight loss and salt restriction.  4. OSA on CPAP. Keep follow-up with PCP.  Current medicines were reviewed with the patient today.   Orders Placed This Encounter  Procedures  . EKG 12-Lead    Disposition: Follow-up in 6 months.  Signed, Jonelle Sidle, MD, [facility] 02/13/2017 4:08 PM    [facility] at [facility] [address] [place], [place], [place] [address] Phone: [phone]; Fax: [phone]

## 2017-02-13 ENCOUNTER — Encounter: Payer: Self-pay | Admitting: Cardiology

## 2017-02-13 ENCOUNTER — Ambulatory Visit (INDEPENDENT_AMBULATORY_CARE_PROVIDER_SITE_OTHER): Payer: Medicare Other | Admitting: Cardiology

## 2017-02-13 VITALS — BP 160/92 | HR 62 | Ht 65.0 in | Wt 207.0 lb

## 2017-02-13 DIAGNOSIS — I10 Essential (primary) hypertension: Secondary | ICD-10-CM | POA: Diagnosis not present

## 2017-02-13 DIAGNOSIS — G4733 Obstructive sleep apnea (adult) (pediatric): Secondary | ICD-10-CM

## 2017-02-13 DIAGNOSIS — I2510 Atherosclerotic heart disease of native coronary artery without angina pectoris: Secondary | ICD-10-CM

## 2017-02-13 DIAGNOSIS — E782 Mixed hyperlipidemia: Secondary | ICD-10-CM | POA: Diagnosis not present

## 2017-02-13 DIAGNOSIS — Z9989 Dependence on other enabling machines and devices: Secondary | ICD-10-CM

## 2017-02-13 NOTE — Patient Instructions (Signed)

## 2017-04-02 ENCOUNTER — Other Ambulatory Visit: Payer: Self-pay | Admitting: Cardiology

## 2017-08-06 ENCOUNTER — Other Ambulatory Visit: Payer: Self-pay | Admitting: Cardiology

## 2017-08-23 ENCOUNTER — Other Ambulatory Visit: Payer: Self-pay | Admitting: Cardiology

## 2017-09-21 ENCOUNTER — Ambulatory Visit: Payer: Medicare Other | Admitting: Cardiology

## 2017-09-27 ENCOUNTER — Other Ambulatory Visit: Payer: Self-pay | Admitting: Cardiology

## 2017-10-02 ENCOUNTER — Other Ambulatory Visit: Payer: Self-pay | Admitting: *Deleted

## 2017-10-02 MED ORDER — HYDRALAZINE HCL 50 MG PO TABS: 75.0000 mg | ORAL_TABLET | Freq: Three times a day (TID) | ORAL | 3 refills | Status: DC

## 2017-10-15 ENCOUNTER — Encounter: Payer: Self-pay | Admitting: Cardiology

## 2017-10-15 ENCOUNTER — Ambulatory Visit (INDEPENDENT_AMBULATORY_CARE_PROVIDER_SITE_OTHER): Payer: Medicare Other | Admitting: Cardiology

## 2017-10-15 VITALS — BP 168/82 | HR 64 | Ht 65.0 in | Wt 206.6 lb

## 2017-10-15 DIAGNOSIS — Z9989 Dependence on other enabling machines and devices: Secondary | ICD-10-CM

## 2017-10-15 DIAGNOSIS — R0602 Shortness of breath: Secondary | ICD-10-CM

## 2017-10-15 DIAGNOSIS — I10 Essential (primary) hypertension: Secondary | ICD-10-CM | POA: Diagnosis not present

## 2017-10-15 DIAGNOSIS — G4733 Obstructive sleep apnea (adult) (pediatric): Secondary | ICD-10-CM | POA: Diagnosis not present

## 2017-10-15 DIAGNOSIS — I2510 Atherosclerotic heart disease of native coronary artery without angina pectoris: Secondary | ICD-10-CM

## 2017-10-15 NOTE — Progress Notes (Signed)
Cardiology Office Note  Date: 10/15/2017   ID: Heather Livingston Heather Livingston, DOB 10/28/1950, MRN [id number]  PCP: Theodoro KosLewis, William B, MD  Primary Cardiologist: Nona DellSamuel Lester Crickenberger, MD   Chief Complaint  Patient presents with  . Coronary Artery Disease  . Shortness of Breath    History of Present Illness: Heather Livingston is a 67 y.o. female last seen in May 2018. She presents ahead of her scheduled follow-up visit later this month. She states that she has had progressive shortness of breath over the last several weeks, significant cough and sinus congestion around the new year. She was seen recently at her PCP office and diagnosed with upper respiratory tract infection, no acute infiltrates by chest x-ray and no pulmonary edema. She was placed on course of steroids and antibiotics, just started them both. Still feels generalized malaise. She does not report any angina symptoms.  I personally reviewed her ECG today which shows sinus rhythm with occasional PVCs, diffuse ST-T wave abnormalities that are old with increased voltage.  She states that she has been compliant with her cardiac regimen which is outlined below.  Past Medical History:  Diagnosis Date  . Bradycardia   . Cataracts, bilateral   . Coronary atherosclerosis of native coronary artery    DES circ 10/10, residual 100% occluded SVG to OM, patent LIMA to LAD and SVG to PDA grafts, LVEF 50%  . GERD (gastroesophageal reflux disease)   . Hyperlipemia   . Hypertension   . OSA (obstructive sleep apnea)    On CPAP  . Palpitations    Transient PAT/AF, CHADS2 score 1    Past Surgical History:  Procedure Laterality Date  . ABDOMINAL HYSTERECTOMY    . CARPAL TUNNEL RELEASE    . CORONARY ARTERY [facility] GRAFT     [facility] LIMA to LAD, SVG to OM, SVG to PDA  . LEFT HEART CATHETERIZATION WITH CORONARY/GRAFT ANGIOGRAM N/A 12/27/2012   Procedure: LEFT HEART CATHETERIZATION WITH Isabel CapriceORONARY/GRAFT ANGIOGRAM;  Surgeon: Herby Abrahamhomas D Stuckey, MD;   [facility];  Service: Cardiovascular;  Laterality: N/A;    Current Outpatient Medications  Medication Sig Dispense Refill  . albuterol (VENTOLIN HFA) 108 (90 Base) MCG/ACT inhaler Inhale 1 puff into the lungs as needed.     Marland Kitchen. amLODipine (NORVASC) 5 MG tablet Take 5 mg by mouth daily.    Marland Kitchen. aspirin EC 81 MG tablet Take 1 tablet (81 mg total) by mouth daily.    . benzonatate (TESSALON PERLES) 100 MG capsule Take 100 mg by mouth every 8 (eight) hours as needed for cough.    . clopidogrel (PLAVIX) 75 MG tablet TAKE 1 TABLET BY MOUTH DAILY. 90 tablet 0  . fluticasone (FLONASE) 50 MCG/ACT nasal spray Place 2 sprays into both nostrils daily.  3  . hydrALAZINE (APRESOLINE) 50 MG tablet Take 1.5 tablets (75 mg total) by mouth 3 (three) times daily. 405 tablet 3  . hydrochlorothiazide (HYDRODIURIL) 25 MG tablet TAKE 1 TABLET BY MOUTH EVERY DAY 90 tablet 3  . HYDROcodone-acetaminophen (NORCO) 7.5-325 MG tablet Take 1 tablet by mouth every 6 (six) hours as needed for moderate pain.    Marland Kitchen. ibuprofen (ADVIL,MOTRIN) 800 MG tablet Take 800 mg by mouth every 8 (eight) hours as needed.    . isosorbide mononitrate (IMDUR) 60 MG 24 hr tablet TAKE 1 TABLET (60 MG TOTAL) BY MOUTH DAILY. 90 tablet 0  . levofloxacin (LEVAQUIN) 500 MG tablet Take 500 mg by mouth daily.    Marland Kitchen. losartan (  COZAAR) 100 MG tablet TAKE 1 TABLET (100 MG TOTAL) BY MOUTH DAILY. 90 tablet 0  . magnesium oxide (MAG-OX) 400 (241.3 Mg) MG tablet Take 1 tablet by mouth daily.  3  . metoprolol tartrate (LOPRESSOR) 100 MG tablet TAKE 1 TABLET BY MOUTH TWICE A DAY 60 tablet 0  . nitroGLYCERIN (NITROSTAT) 0.4 MG SL tablet Place 1 tablet (0.4 mg total) under the tongue every 5 (five) minutes x 3 doses as needed for chest pain. 25 tablet 3  . NON FORMULARY CPAP MACHINE NIGHTLY    . predniSONE (DELTASONE) 20 MG tablet Take 40 mg by mouth daily with breakfast. X 5 days    . ranitidine (ZANTAC) 300 MG tablet Take 300 mg by mouth at bedtime.    .  simvastatin (ZOCOR) 40 MG tablet TAKE 1 TABLET BY MOUTH EVERYDAY AT BEDTIME 30 tablet 0   No current facility-administered medications for this visit.    Allergies:  Patient has no known allergies.   Social History: The patient  reports that she quit smoking about 10 years ago. Her smoking use included cigarettes. She started smoking about 52 years ago. She has a 32.25 pack-year smoking history. she has never used smokeless tobacco. She reports that she drinks alcohol. She reports that she does not use drugs.   ROS:  Please see the history of present illness. Otherwise, complete review of systems is positive for none.  All other systems are reviewed and negative.   Physical Exam: VS:  BP (!) 168/82   Pulse 64   Ht 5\' 5"  (1.651 m)   Wt 206 lb 9.6 oz (93.7 kg)   SpO2 93%   BMI 34.38 kg/m , BMI Body mass index is 34.38 kg/m.  Wt Readings from Last 3 Encounters:  10/15/17 206 lb 9.6 oz (93.7 kg)  02/13/17 207 lb (93.9 kg)  08/13/16 205 lb (93 kg)    General: Patient wearing respiratory mask. No acute distress. HEENT: Conjunctiva and lids normal. Neck: Supple, no elevated JVP or carotid bruits, no thyromegaly. Lungs: Prolonged expiratory phase with scattered rhonchi and slight expiratory wheezes, nonlabored breathing at rest. Cardiac: Regular rate and rhythm, no S3, soft systolic murmur. Abdomen: Soft, nontender, bowel sounds present. Extremities: No pitting edema, distal pulses 2+. Skin: Warm and dry. Musculoskeletal: No kyphosis. Neuropsychiatric: Alert and oriented x3, affect grossly appropriate.  ECG: I personally reviewed the tracing from 5/fourth 2018 which showed sinus rhythm with PVCs, possible old inferior infarct pattern, nonspecific ST-T wave changes.  Recent Labwork:  February 2018: Hemoglobin 15.7, platelets 204, potassium 4.2, BUN 19, creatinine 0.6, hemoglobin A1c 5.6, AST 27, ALT 29, cholesterol 127, triglycerides 109, HDL 35, LDL 70, TSH 2.96  Other Studies  Reviewed Today:  Cardiac catheterization3/70/2014: Coronary angiography: Coronary dominance: right  Left mainstem:Long, calcified vessel, distal 40-50% stenosis  Left anterior descending (LAD):Total occlusion proximally. Fills from the LIMA.  Left circumflex (LCx):Tortuous, patent vessel. Large intermediate branch is patent. Proximal and mid-vessel stents are patent without significant ISR.  Right coronary artery (RCA):100% proximal stenosis (total occlusion)  SVG - PDA: patent with mild diffuse irregularity. PDA is patent, PLA is patent  LIMA - LAD: widely patent. Distal anastomosis is patent  Left ventriculography: Left ventricular systolic function is low normal. There is mild inferior wall hypokinesis. LVEF is estimated at 50%.  Abdominal aortography: no aneurysm. Diffuse irregularity of the infrarenal aorta. Renal arteries are patent. Diffuse iliac disease but iliacs are poorly visualized.  Final Conclusions:  1. Severe 3 vessel  CAD with total occlusion of the RCA, patent stents in the LCx, and total occlusion of the LAD 2. Continued patency of the LIMA to LAD and SVG to PDA. 3. Known occlusion of the SVG to OM 4. Mild LV dysfunction with preserved LVEF  Assessment and Plan:  1. Recent shortness of breath associated with cough and chest congestion as well as sinus pressure. She was just recently diagnosed with URI by PCP within the last few days and starting on course of steroids and antibiotics. Her ECG is relatively stable and she is not reporting frank angina symptoms. Chest x-ray did not demonstrate infiltrates or pulmonary edema. Recommended that she complete current course of action and follow-up with PCP if symptoms do not improve. We can always consider an echocardiogram for further assessment depending on how she does but at this point suspect pulmonary etiology rather than cardiac.  2. CAD status post DES to the circumflex with occluded vein graft to the  obtuse marginal system, patent LIMA to LAD and SVG to PDA. Continue medical therapy for now. Keep next scheduled visit.  3. OSA on CPAP, she reports compliance  4. Essential hypertension, no changes in current medical therapy.  Current medicines were reviewed with the patient today.   Orders Placed This Encounter  Procedures  . EKG 12-Lead    Disposition: Keep next scheduled visit.  Signed, Jonelle Sidle, MD, [facility] 10/15/2017 4:45 PM    [facility] at [facility] [address] [place], [place], [place] [address] Phone: [phone]; Fax: [phone]

## 2017-10-15 NOTE — Patient Instructions (Signed)
Medication Instructions:  Your physician recommends that you continue on your current medications as directed. Please refer to the Current Medication list given to you today.  Labwork: none  Testing/Procedures: none  Follow-Up: Your physician recommends that you schedule a follow-up appointment in: ON 11/05/17  Any Other Special Instructions Will Be Listed Below (If Applicable).  If you need a refill on your cardiac medications before your next appointment, please call your pharmacy.

## 2017-10-25 ENCOUNTER — Other Ambulatory Visit: Payer: Self-pay | Admitting: Cardiology

## 2017-11-02 ENCOUNTER — Other Ambulatory Visit: Payer: Self-pay | Admitting: Cardiology

## 2017-11-03 ENCOUNTER — Ambulatory Visit: Payer: Medicare Other | Admitting: Cardiology

## 2017-11-13 ENCOUNTER — Other Ambulatory Visit: Payer: Self-pay | Admitting: *Deleted

## 2017-11-13 MED ORDER — LOSARTAN POTASSIUM 100 MG PO TABS: 100.0000 mg | ORAL_TABLET | Freq: Every day | ORAL | 0 refills | Status: DC

## 2017-11-20 ENCOUNTER — Other Ambulatory Visit: Payer: Self-pay | Admitting: Cardiology

## 2017-11-20 NOTE — Progress Notes (Signed)
Cardiology Office Note  Date: 11/23/2017   ID: Sianna Garofano, DOB [birth year], MRN [id number]  PCP: Theodoro Kos, MD  Primary Cardiologist: Nona Dell, MD   Chief Complaint  Patient presents with  . Coronary Artery Disease    History of Present Illness: Janeann Sundus Pete is a 67 y.o. female last seen in January of this year.  She presents for a follow-up visit.  States that she is not back to baseline but that her previous URI has improved.  She is off steroids and previous leg swelling has also improved.   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  She does not report any angina symptoms on current medical regimen.  I reviewed her recent lab work which is outlined below.  Current cardiac regimen includes aspirin, Plavix, Norvasc, hydralazine, HCTZ, Imdur, Cozaar, Lopressor, Zocor, and as needed nitroglycerin.  Past Medical History:  Diagnosis Date  . Bradycardia   . Cataracts, bilateral   . Coronary atherosclerosis of native coronary artery    DES circ 10/10, residual 100% occluded SVG to OM, patent LIMA to LAD and SVG to PDA grafts, LVEF 50%  . GERD (gastroesophageal reflux disease)   . Hyperlipemia   . Hypertension   . OSA (obstructive sleep apnea)    On CPAP  . Palpitations    Transient PAT/AF, CHADS2 score 1    Past Surgical History:  Procedure Laterality Date  . ABDOMINAL HYSTERECTOMY    . CARPAL TUNNEL RELEASE    . CORONARY ARTERY BYPASS GRAFT     [facility], LIMA to LAD, SVG to OM, SVG to PDA  . LEFT HEART CATHETERIZATION WITH CORONARY/GRAFT ANGIOGRAM N/A 12/27/2012   Procedure: LEFT HEART CATHETERIZATION WITH Isabel Caprice;  Surgeon: Herby Abraham, MD;  Location: [facility] CATH LAB;  Service: Cardiovascular;  Laterality: N/A;    Current Outpatient Medications  Medication Sig Dispense Refill  . albuterol (VENTOLIN HFA) 108 (90 Base) MCG/ACT inhaler Inhale 1 puff into the lungs as needed.     Marland Kitchen  amLODipine (NORVASC) 5 MG tablet Take 1 tablet (5 mg total) by mouth daily. 90 tablet 1  . aspirin EC 81 MG tablet Take 1 tablet (81 mg total) by mouth daily.    . clopidogrel (PLAVIX) 75 MG tablet Take 1 tablet (75 mg total) by mouth daily. 90 tablet 1  . fluticasone (FLONASE) 50 MCG/ACT nasal spray Place 2 sprays into both nostrils daily.  3  . hydrALAZINE (APRESOLINE) 50 MG tablet Take 1.5 tablets (75 mg total) by mouth 3 (three) times daily. 405 tablet 1  . hydrochlorothiazide (HYDRODIURIL) 25 MG tablet TAKE 1 TABLET BY MOUTH EVERY DAY 90 tablet 3  . HYDROcodone-acetaminophen (NORCO) 7.5-325 MG tablet Take 1 tablet by mouth every 6 (six) hours as needed for moderate pain.    Marland Kitchen ibuprofen (ADVIL,MOTRIN) 800 MG tablet Take 800 mg by mouth every 8 (eight) hours as needed.    . isosorbide mononitrate (IMDUR) 60 MG 24 hr tablet Take 1 tablet (60 mg total) by mouth daily. 90 tablet 1  . losartan (COZAAR) 100 MG tablet Take 1 tablet (100 mg total) by mouth daily. 90 tablet 1  . magnesium oxide (MAG-OX) 400 (241.3 Mg) MG tablet Take 1 tablet by mouth daily.  3  . metoprolol tartrate (LOPRESSOR) 100 MG tablet Take 1 tablet (100 mg total) by mouth 2 (two) times daily. 180 tablet 1  . nitroGLYCERIN (NITROSTAT) 0.4 MG SL tablet Place 1  tablet (0.4 mg total) under the tongue every 5 (five) minutes x 3 doses as needed for chest pain. 25 tablet 3  . NON FORMULARY CPAP MACHINE NIGHTLY    . ranitidine (ZANTAC) 300 MG tablet Take 300 mg by mouth at bedtime.    . simvastatin (ZOCOR) 40 MG tablet TAKE 1 TABLET BY MOUTH EVERYDAY AT BEDTIME 90 tablet 1   No current facility-administered medications for this visit.    Allergies:  Patient has no known allergies.   Social History: The patient  reports that she quit smoking about 10 years ago. Her smoking use included cigarettes. She started smoking about 52 years ago. She has a 32.25 pack-year smoking history. she has never used smokeless tobacco. She reports that  she drinks alcohol. She reports that she does not use drugs.   ROS:  Please see the history of present illness. Otherwise, complete review of systems is positive for fatigue.  All other systems are reviewed and negative.   Physical Exam: VS:  BP (!) 180/88   Pulse 63   Ht 5\' 5"  (1.651 m)   Wt 217 lb 12.8 oz (98.8 kg)   SpO2 95%   BMI 36.24 kg/m , BMI Body mass index is 36.24 kg/m.  Wt Readings from Last 3 Encounters:  11/23/17 217 lb 12.8 oz (98.8 kg)  10/15/17 206 lb 9.6 oz (93.7 kg)  02/13/17 207 lb (93.9 kg)    General: Patient appears comfortable at rest. HEENT: Conjunctiva and lids normal, oropharynx clear. Neck: Supple, no elevated JVP or carotid bruits, no thyromegaly. Lungs: No wheezing or crackles, nonlabored breathing at rest. Cardiac: Regular rate and rhythm, no S3, soft systolic murmur. Abdomen: Soft, nontender, bowel sounds present. Extremities: No pitting edema, distal pulses 2+. Skin: Warm and dry. Musculoskeletal: No kyphosis. Neuropsychiatric: Alert and oriented x3, affect grossly appropriate.  ECG: I personally reviewed the tracing from 10/15/2017 which showed sinus rhythm with occasional PACs, increased voltage and repolarization abnormalities.  Recent Labwork:  February 2018: Hemoglobin 15.7, platelets 204, potassium 4.2, BUN 19, creatinine 0.6, hemoglobin A1c 5.6, AST 27, ALT 29, cholesterol 127, triglycerides 109, HDL 35, LDL 70, TSH 2.96 January 2019: Hemoglobin 15.8, platelets 242, potassium 4.2, BUN 15, creatinine 0.75, AST 18, ALT 17, cholesterol 112, HDL 39, LDL 54, triglycerides 97, TSH 2.09, hemoglobin A1c 5.5.  Other Studies Reviewed Today:  Cardiac catheterization3/70/2014: Coronary angiography: Coronary dominance: right  Left mainstem:Long, calcified vessel, distal 40-50% stenosis  Left anterior descending (LAD):Total occlusion proximally. Fills from the LIMA.  Left circumflex (LCx):Tortuous, patent vessel. Large intermediate branch  is patent. Proximal and mid-vessel stents are patent without significant ISR.  Right coronary artery (RCA):100% proximal stenosis (total occlusion)  SVG - PDA: patent with mild diffuse irregularity. PDA is patent, PLA is patent  LIMA - LAD: widely patent. Distal anastomosis is patent  Left ventriculography: Left ventricular systolic function is low normal. There is mild inferior wall hypokinesis. LVEF is estimated at 50%.  Abdominal aortography: no aneurysm. Diffuse irregularity of the infrarenal aorta. Renal arteries are patent. Diffuse iliac disease but iliacs are poorly visualized.  Final Conclusions:  1. Severe 3 vessel CAD with total occlusion of the RCA, patent stents in the LCx, and total occlusion of the LAD 2. Continued patency of the LIMA to LAD and SVG to PDA. 3. Known occlusion of the SVG to OM 4. Mild LV dysfunction with preserved LVEF  Assessment and Plan:  1.  CAD status post DES to the circumflex with occluded vein  graft to the obtuse marginal system, patent LIMA to LAD and patent SVG to PDA.  Continue with current medical therapy.  Agree with regular exercise as tolerated.  2.  OSA on CPAP.  3.  Essential hypertension, continue with current regimen.  Keep follow-up with Dr. Melvyn Neth.  4.  Hyperlipidemia, on statin therapy.  Recent LDL 54.  Current medicines were reviewed with the patient today.  Disposition: Follow-up in 6 months.  Signed, Jonelle Sidle, MD, [facility] 11/23/2017 4:13 PM    [facility] [address] [place], [place], [place] [address] Phone: [phone]; Fax: [phone]

## 2017-11-23 ENCOUNTER — Ambulatory Visit (INDEPENDENT_AMBULATORY_CARE_PROVIDER_SITE_OTHER): Payer: Medicare Other | Admitting: Cardiology

## 2017-11-23 ENCOUNTER — Encounter: Payer: Self-pay | Admitting: Cardiology

## 2017-11-23 VITALS — BP 180/88 | HR 63 | Ht 65.0 in | Wt 217.8 lb

## 2017-11-23 DIAGNOSIS — E782 Mixed hyperlipidemia: Secondary | ICD-10-CM | POA: Diagnosis not present

## 2017-11-23 DIAGNOSIS — Z9989 Dependence on other enabling machines and devices: Secondary | ICD-10-CM | POA: Diagnosis not present

## 2017-11-23 DIAGNOSIS — G4733 Obstructive sleep apnea (adult) (pediatric): Secondary | ICD-10-CM | POA: Diagnosis not present

## 2017-11-23 DIAGNOSIS — I10 Essential (primary) hypertension: Secondary | ICD-10-CM | POA: Diagnosis not present

## 2017-11-23 DIAGNOSIS — I2510 Atherosclerotic heart disease of native coronary artery without angina pectoris: Secondary | ICD-10-CM | POA: Diagnosis not present

## 2017-11-23 MED ORDER — LOSARTAN POTASSIUM 100 MG PO TABS: 100.0000 mg | ORAL_TABLET | Freq: Every day | ORAL | 1 refills | Status: DC

## 2017-11-23 MED ORDER — SIMVASTATIN 40 MG PO TABS: ORAL_TABLET | ORAL | 1 refills | Status: DC

## 2017-11-23 MED ORDER — ISOSORBIDE MONONITRATE ER 60 MG PO TB24: 60.0000 mg | ORAL_TABLET | Freq: Every day | ORAL | 1 refills | Status: DC

## 2017-11-23 MED ORDER — HYDRALAZINE HCL 50 MG PO TABS: 75.0000 mg | ORAL_TABLET | Freq: Three times a day (TID) | ORAL | 1 refills | Status: DC

## 2017-11-23 MED ORDER — AMLODIPINE BESYLATE 5 MG PO TABS: 5.0000 mg | ORAL_TABLET | Freq: Every day | ORAL | 1 refills | Status: DC

## 2017-11-23 MED ORDER — CLOPIDOGREL BISULFATE 75 MG PO TABS: 75.0000 mg | ORAL_TABLET | Freq: Every day | ORAL | 1 refills | Status: DC

## 2017-11-23 MED ORDER — METOPROLOL TARTRATE 100 MG PO TABS: 100.0000 mg | ORAL_TABLET | Freq: Two times a day (BID) | ORAL | 1 refills | Status: DC

## 2017-11-23 NOTE — Patient Instructions (Signed)

## 2018-01-19 ENCOUNTER — Emergency Department (HOSPITAL_COMMUNITY): Payer: Medicare Other

## 2018-01-19 ENCOUNTER — Ambulatory Visit (INDEPENDENT_AMBULATORY_CARE_PROVIDER_SITE_OTHER): Payer: Medicare Other

## 2018-01-19 ENCOUNTER — Encounter (HOSPITAL_COMMUNITY)
Admission: EM | Disposition: A | Discharge status: Home / Self Care | Payer: Self-pay | Source: Home / Self Care | Attending: Cardiovascular Disease

## 2018-01-19 ENCOUNTER — Inpatient Hospital Stay (HOSPITAL_COMMUNITY)
Admission: EM | Admit: 2018-01-19 | Discharge: 2018-01-21 | DRG: 287 | Disposition: A | Discharge status: Home / Self Care | Payer: Medicare Other | Attending: Cardiovascular Disease | Admitting: Cardiovascular Disease

## 2018-01-19 ENCOUNTER — Encounter (HOSPITAL_COMMUNITY): Payer: Self-pay | Admitting: Emergency Medicine

## 2018-01-19 VITALS — BP 188/82 | HR 67

## 2018-01-19 DIAGNOSIS — Z7982 Long term (current) use of aspirin: Secondary | ICD-10-CM

## 2018-01-19 DIAGNOSIS — E785 Hyperlipidemia, unspecified: Secondary | ICD-10-CM | POA: Diagnosis not present

## 2018-01-19 DIAGNOSIS — I2582 Chronic total occlusion of coronary artery: Secondary | ICD-10-CM | POA: Diagnosis present

## 2018-01-19 DIAGNOSIS — I714 Abdominal aortic aneurysm, without rupture: Secondary | ICD-10-CM | POA: Diagnosis present

## 2018-01-19 DIAGNOSIS — I25119 Atherosclerotic heart disease of native coronary artery with unspecified angina pectoris: Secondary | ICD-10-CM | POA: Diagnosis not present

## 2018-01-19 DIAGNOSIS — I700 Atherosclerosis of aorta: Secondary | ICD-10-CM

## 2018-01-19 DIAGNOSIS — Z955 Presence of coronary angioplasty implant and graft: Secondary | ICD-10-CM

## 2018-01-19 DIAGNOSIS — I10 Essential (primary) hypertension: Secondary | ICD-10-CM | POA: Diagnosis present

## 2018-01-19 DIAGNOSIS — R001 Bradycardia, unspecified: Secondary | ICD-10-CM | POA: Diagnosis present

## 2018-01-19 DIAGNOSIS — G4733 Obstructive sleep apnea (adult) (pediatric): Secondary | ICD-10-CM | POA: Diagnosis not present

## 2018-01-19 DIAGNOSIS — Z79899 Other long term (current) drug therapy: Secondary | ICD-10-CM

## 2018-01-19 DIAGNOSIS — Z90710 Acquired absence of both cervix and uterus: Secondary | ICD-10-CM

## 2018-01-19 DIAGNOSIS — Z7902 Long term (current) use of antithrombotics/antiplatelets: Secondary | ICD-10-CM

## 2018-01-19 DIAGNOSIS — Z7951 Long term (current) use of inhaled steroids: Secondary | ICD-10-CM | POA: Diagnosis not present

## 2018-01-19 DIAGNOSIS — Z951 Presence of aortocoronary bypass graft: Secondary | ICD-10-CM | POA: Diagnosis not present

## 2018-01-19 DIAGNOSIS — I25110 Atherosclerotic heart disease of native coronary artery with unstable angina pectoris: Secondary | ICD-10-CM | POA: Diagnosis not present

## 2018-01-19 DIAGNOSIS — I252 Old myocardial infarction: Secondary | ICD-10-CM | POA: Diagnosis not present

## 2018-01-19 DIAGNOSIS — R079 Chest pain, unspecified: Secondary | ICD-10-CM

## 2018-01-19 DIAGNOSIS — I25700 Atherosclerosis of coronary artery bypass graft(s), unspecified, with unstable angina pectoris: Secondary | ICD-10-CM | POA: Diagnosis present

## 2018-01-19 DIAGNOSIS — I725 Aneurysm of other precerebral arteries: Secondary | ICD-10-CM

## 2018-01-19 DIAGNOSIS — I671 Cerebral aneurysm, nonruptured: Secondary | ICD-10-CM | POA: Diagnosis not present

## 2018-01-19 DIAGNOSIS — Z87891 Personal history of nicotine dependence: Secondary | ICD-10-CM

## 2018-01-19 DIAGNOSIS — K219 Gastro-esophageal reflux disease without esophagitis: Secondary | ICD-10-CM | POA: Diagnosis present

## 2018-01-19 DIAGNOSIS — Z8249 Family history of ischemic heart disease and other diseases of the circulatory system: Secondary | ICD-10-CM | POA: Diagnosis not present

## 2018-01-19 DIAGNOSIS — I495 Sick sinus syndrome: Secondary | ICD-10-CM | POA: Diagnosis present

## 2018-01-19 DIAGNOSIS — I200 Unstable angina: Secondary | ICD-10-CM | POA: Diagnosis present

## 2018-01-19 DIAGNOSIS — I7143 Infrarenal abdominal aortic aneurysm, without rupture: Secondary | ICD-10-CM

## 2018-01-19 DIAGNOSIS — R0789 Other chest pain: Secondary | ICD-10-CM | POA: Diagnosis not present

## 2018-01-19 HISTORY — PX: ABDOMINAL AORTOGRAM: CATH118222

## 2018-01-19 HISTORY — DX: Acute myocardial infarction, unspecified: I21.9

## 2018-01-19 HISTORY — PX: LEFT HEART CATH AND CORS/GRAFTS ANGIOGRAPHY: CATH118250

## 2018-01-19 LAB — PROTIME-INR
INR: 0.95
PROTHROMBIN TIME: 12.6 s (ref 11.4–15.2)

## 2018-01-19 LAB — BASIC METABOLIC PANEL
ANION GAP: 9 (ref 5–15)
BUN: 18 mg/dL (ref 6–20)
CHLORIDE: 104 mmol/L (ref 101–111)
CO2: 26 mmol/L (ref 22–32)
Calcium: 9.1 mg/dL (ref 8.9–10.3)
Creatinine, Ser: 0.71 mg/dL (ref 0.44–1.00)
GFR calc non Af Amer: >60 mL/min (ref >60)
Glucose, Bld: 95 mg/dL (ref 65–99)
POTASSIUM: 3.9 mmol/L (ref 3.5–5.1)
SODIUM: 139 mmol/L (ref 135–145)

## 2018-01-19 LAB — I-STAT TROPONIN, ED: Troponin i, poc: 0.03 ng/mL (ref 0.00–0.08)

## 2018-01-19 LAB — CBC
HEMATOCRIT: 43.9 % (ref 36.0–46.0)
HEMOGLOBIN: 14.3 g/dL (ref 12.0–15.0)
MCH: 30.9 pg (ref 26.0–34.0)
MCHC: 32.6 g/dL (ref 30.0–36.0)
MCV: 94.8 fL (ref 78.0–100.0)
Platelets: 205 10*3/uL (ref 150–400)
RBC: 4.63 MIL/uL (ref 3.87–5.11)
RDW: 13.3 % (ref 11.5–15.5)
WBC: 5.2 10*3/uL (ref 4.0–10.5)

## 2018-01-19 LAB — POCT ACTIVATED CLOTTING TIME: Activated Clotting Time: 98 seconds

## 2018-01-19 LAB — APTT: APTT: 33 s (ref 24–36)

## 2018-01-19 LAB — TROPONIN I: Troponin I: <0.03 ng/mL (ref <0.03)

## 2018-01-19 LAB — MAGNESIUM: MAGNESIUM: 1.9 mg/dL (ref 1.7–2.4)

## 2018-01-19 SURGERY — LEFT HEART CATH AND CORS/GRAFTS ANGIOGRAPHY
Anesthesia: LOCAL

## 2018-01-19 MED ORDER — ONDANSETRON HCL 4 MG/2ML IJ SOLN: 4.0000 mg | Freq: Four times a day (QID) | INTRAMUSCULAR | Status: DC | PRN

## 2018-01-19 MED ORDER — NITROGLYCERIN 0.4 MG SL SUBL: 0.4000 mg | SUBLINGUAL_TABLET | SUBLINGUAL | Status: DC | PRN

## 2018-01-19 MED ORDER — IOPAMIDOL (ISOVUE-370) INJECTION 76%
INTRAVENOUS | Status: DC | PRN
  Administered 2018-01-19: 120 mL via INTRA_ARTERIAL

## 2018-01-19 MED ORDER — HYDRALAZINE HCL 20 MG/ML IJ SOLN
10.0000 mg | INTRAMUSCULAR | Status: DC | PRN
  Administered 2018-01-19: 10 mg via INTRAVENOUS

## 2018-01-19 MED ORDER — HYDROCODONE-ACETAMINOPHEN 7.5-325 MG PO TABS
1.0000 | ORAL_TABLET | Freq: Four times a day (QID) | ORAL | Status: DC | PRN
  Administered 2018-01-19 – 2018-01-21 (×4): 1 via ORAL
  Filled 2018-01-19 (×4): qty 1

## 2018-01-19 MED ORDER — SODIUM CHLORIDE 0.9 % IV SOLN: INTRAVENOUS | Status: AC

## 2018-01-19 MED ORDER — ASPIRIN EC 81 MG PO TBEC
81.0000 mg | DELAYED_RELEASE_TABLET | Freq: Every day | ORAL | Status: DC
  Administered 2018-01-20 – 2018-01-21 (×2): 81 mg via ORAL
  Filled 2018-01-19 (×2): qty 1

## 2018-01-19 MED ORDER — METOPROLOL TARTRATE 100 MG PO TABS
100.0000 mg | ORAL_TABLET | Freq: Two times a day (BID) | ORAL | Status: DC
  Administered 2018-01-19: 100 mg via ORAL
  Filled 2018-01-19 (×2): qty 1

## 2018-01-19 MED ORDER — ALBUTEROL SULFATE HFA 108 (90 BASE) MCG/ACT IN AERS: 1.0000 | INHALATION_SPRAY | Freq: Four times a day (QID) | RESPIRATORY_TRACT | Status: DC | PRN

## 2018-01-19 MED ORDER — SODIUM CHLORIDE 0.9% FLUSH: 3.0000 mL | INTRAVENOUS | Status: DC | PRN

## 2018-01-19 MED ORDER — HEPARIN (PORCINE) IN NACL 100-0.45 UNIT/ML-% IJ SOLN: 12.0000 [IU]/kg/h | INTRAMUSCULAR | Status: DC

## 2018-01-19 MED ORDER — HEPARIN (PORCINE) IN NACL 2-0.9 UNIT/ML-% IJ SOLN
INTRAMUSCULAR | Status: AC
  Filled 2018-01-19: qty 1000

## 2018-01-19 MED ORDER — FENTANYL CITRATE (PF) 100 MCG/2ML IJ SOLN
50.0000 ug | Freq: Once | INTRAMUSCULAR | Status: AC
  Administered 2018-01-19: 50 ug via INTRAVENOUS
  Filled 2018-01-19: qty 2

## 2018-01-19 MED ORDER — HYDROCHLOROTHIAZIDE 25 MG PO TABS
25.0000 mg | ORAL_TABLET | Freq: Every day | ORAL | Status: DC
  Administered 2018-01-20 – 2018-01-21 (×2): 25 mg via ORAL
  Filled 2018-01-19 (×2): qty 1

## 2018-01-19 MED ORDER — SIMVASTATIN 40 MG PO TABS
40.0000 mg | ORAL_TABLET | Freq: Every day | ORAL | Status: DC
  Administered 2018-01-19: 40 mg via ORAL
  Filled 2018-01-19: qty 1

## 2018-01-19 MED ORDER — ACETAMINOPHEN 325 MG PO TABS: 650.0000 mg | ORAL_TABLET | ORAL | Status: DC | PRN

## 2018-01-19 MED ORDER — HEPARIN (PORCINE) IN NACL 100-0.45 UNIT/ML-% IJ SOLN
12.0000 [IU]/kg/h | INTRAMUSCULAR | Status: DC
  Filled 2018-01-19: qty 250

## 2018-01-19 MED ORDER — SODIUM CHLORIDE 0.9% FLUSH
3.0000 mL | Freq: Two times a day (BID) | INTRAVENOUS | Status: DC
  Administered 2018-01-19 – 2018-01-21 (×4): 3 mL via INTRAVENOUS

## 2018-01-19 MED ORDER — ALBUTEROL SULFATE (2.5 MG/3ML) 0.083% IN NEBU: 2.5000 mg | INHALATION_SOLUTION | Freq: Four times a day (QID) | RESPIRATORY_TRACT | Status: DC | PRN

## 2018-01-19 MED ORDER — IOPAMIDOL (ISOVUE-370) INJECTION 76%
75.0000 mL | Freq: Once | INTRAVENOUS | Status: AC | PRN
  Administered 2018-01-19: 75 mL via INTRAVENOUS

## 2018-01-19 MED ORDER — HEPARIN BOLUS VIA INFUSION: 4000.0000 [IU] | Freq: Once | INTRAVENOUS | Status: DC

## 2018-01-19 MED ORDER — ISOSORBIDE MONONITRATE ER 60 MG PO TB24
60.0000 mg | ORAL_TABLET | Freq: Every day | ORAL | Status: DC
  Administered 2018-01-20 – 2018-01-21 (×2): 60 mg via ORAL
  Filled 2018-01-19 (×2): qty 1

## 2018-01-19 MED ORDER — FENTANYL CITRATE (PF) 100 MCG/2ML IJ SOLN
INTRAMUSCULAR | Status: AC
  Filled 2018-01-19: qty 2

## 2018-01-19 MED ORDER — LIDOCAINE HCL (PF) 1 % IJ SOLN
INTRAMUSCULAR | Status: AC
  Filled 2018-01-19: qty 30

## 2018-01-19 MED ORDER — HEPARIN (PORCINE) IN NACL 100-0.45 UNIT/ML-% IJ SOLN: 1000.0000 [IU]/h | INTRAMUSCULAR | Status: DC

## 2018-01-19 MED ORDER — LIDOCAINE HCL (PF) 1 % IJ SOLN
INTRAMUSCULAR | Status: DC | PRN
  Administered 2018-01-19: 20 mL

## 2018-01-19 MED ORDER — MIDAZOLAM HCL 2 MG/2ML IJ SOLN
INTRAMUSCULAR | Status: DC | PRN
  Administered 2018-01-19: 2 mg via INTRAVENOUS

## 2018-01-19 MED ORDER — FAMOTIDINE 20 MG PO TABS
20.0000 mg | ORAL_TABLET | Freq: Every day | ORAL | Status: DC
  Administered 2018-01-19 – 2018-01-20 (×2): 20 mg via ORAL
  Filled 2018-01-19 (×2): qty 1

## 2018-01-19 MED ORDER — CLOPIDOGREL BISULFATE 75 MG PO TABS
75.0000 mg | ORAL_TABLET | Freq: Every day | ORAL | Status: DC
  Administered 2018-01-20 – 2018-01-21 (×2): 75 mg via ORAL
  Filled 2018-01-19 (×2): qty 1

## 2018-01-19 MED ORDER — MIDAZOLAM HCL 2 MG/2ML IJ SOLN
INTRAMUSCULAR | Status: AC
  Filled 2018-01-19: qty 2

## 2018-01-19 MED ORDER — AMLODIPINE BESYLATE 5 MG PO TABS
5.0000 mg | ORAL_TABLET | Freq: Every day | ORAL | Status: DC
  Administered 2018-01-21: 5 mg via ORAL
  Filled 2018-01-19 (×2): qty 1

## 2018-01-19 MED ORDER — IOPAMIDOL (ISOVUE-370) INJECTION 76%
INTRAVENOUS | Status: AC
  Filled 2018-01-19: qty 125

## 2018-01-19 MED ORDER — HYDRALAZINE HCL 20 MG/ML IJ SOLN
INTRAMUSCULAR | Status: AC
  Filled 2018-01-19: qty 1

## 2018-01-19 MED ORDER — LOSARTAN POTASSIUM 50 MG PO TABS
100.0000 mg | ORAL_TABLET | Freq: Every day | ORAL | Status: DC
  Administered 2018-01-20 – 2018-01-21 (×2): 100 mg via ORAL
  Filled 2018-01-19 (×2): qty 2

## 2018-01-19 MED ORDER — HEPARIN SODIUM (PORCINE) 5000 UNIT/ML IJ SOLN: 4000.0000 [IU] | Freq: Once | INTRAMUSCULAR | Status: DC

## 2018-01-19 MED ORDER — ONDANSETRON HCL 4 MG/2ML IJ SOLN
4.0000 mg | Freq: Four times a day (QID) | INTRAMUSCULAR | Status: DC | PRN
Start: 2018-01-19 — End: 2018-01-21

## 2018-01-19 MED ORDER — SODIUM CHLORIDE 0.9 % IV SOLN: 250.0000 mL | INTRAVENOUS | Status: DC | PRN

## 2018-01-19 MED ORDER — HEPARIN (PORCINE) IN NACL 100-0.45 UNIT/ML-% IJ SOLN
1550.0000 [IU]/h | INTRAMUSCULAR | Status: DC
  Administered 2018-01-19: 900 [IU]/h via INTRAVENOUS
  Administered 2018-01-20: 1300 [IU]/h via INTRAVENOUS
  Filled 2018-01-19 (×2): qty 250

## 2018-01-19 MED ORDER — HEPARIN (PORCINE) IN NACL 2-0.9 UNIT/ML-% IJ SOLN
INTRAMUSCULAR | Status: AC | PRN
  Administered 2018-01-19 (×2): 500 mL via INTRA_ARTERIAL

## 2018-01-19 MED ORDER — HYDRALAZINE HCL 50 MG PO TABS
75.0000 mg | ORAL_TABLET | Freq: Three times a day (TID) | ORAL | Status: DC
  Administered 2018-01-19 – 2018-01-21 (×5): 75 mg via ORAL
  Filled 2018-01-19 (×5): qty 1

## 2018-01-19 MED ORDER — FENTANYL CITRATE (PF) 100 MCG/2ML IJ SOLN
INTRAMUSCULAR | Status: DC | PRN
  Administered 2018-01-19: 25 ug via INTRAVENOUS

## 2018-01-19 SURGICAL SUPPLY — 11 items
CATH INFINITI 5FR MULTPACK ANG (CATHETERS) ×2 IMPLANT
GUIDEWIRE ANGLED .035X150CM (WIRE) ×2 IMPLANT
KIT HEART LEFT (KITS) ×2 IMPLANT
PACK CARDIAC CATHETERIZATION (CUSTOM PROCEDURE TRAY) ×2 IMPLANT
SHEATH AVANTI 11CM 5FR (SHEATH) ×2 IMPLANT
SYR MEDRAD MARK V 150ML (SYRINGE) ×2 IMPLANT
TRANSDUCER W/STOPCOCK (MISCELLANEOUS) ×2 IMPLANT
TUBING CIL FLEX 10 FLL-RA (TUBING) ×2 IMPLANT
WIRE EMERALD 3MM-J .035X150CM (WIRE) ×2 IMPLANT
WIRE EMERALD 3MM-J .035X260CM (WIRE) ×2 IMPLANT
WIRE HI TORQ VERSACORE-J 145CM (WIRE) ×2 IMPLANT

## 2018-01-19 NOTE — ED Triage Notes (Addendum)
[facility] [place] pain and bradycardia. Per patient, chest "discomfort" started this morning radiating into left arm and jaw "numbness." Per EMS, patient was given 325 mg Aspirin by physician prior to transport to ER.

## 2018-01-19 NOTE — Progress Notes (Signed)
Dr. Eldridge DaceVaranasi by to speak w/patient

## 2018-01-19 NOTE — Progress Notes (Signed)
Patient currently unavailable for evaluation.  (Undergoing coronary angiogram)  Patient with incidentally discovered basilar apex aneurysm.  No history of subarachnoid hemorrhage.  Lesion at this point does not require treatment during this hospitalization.  Patient should follow-up in my office with Dr.Nundkumar for discussion of treatment options.

## 2018-01-19 NOTE — H&P (Signed)
Cardiology Admission History and Physical:   Patient ID: Heather Livingston; [id number]; 03-21-1951   Admission date: 01/19/2018  Primary Care Provider: Theodoro Kos, MD Primary Cardiologist: Dr. Jonelle Sidle  Chief Complaint:  Chest and neck tightness, dizziness  Patient Profile:   Heather Livingston is a 67 y.o. female with a history of hypertension, hyperlipidemia, OSA on CPAP, and multivessel CAD status post CABG as well as DES to the circumflex, known occlusion of the SVG to OM, patent LIMA to the LAD and SVG to the PDA as of 2014.  History of Present Illness:   [name] states that she began feeling unusual yesterday, somewhat unsteady and weak.  Today while she was shopping she began to experience a tightness in her neck/jaw radiating down into her left arm and ultimately her chest, became short of breath and also diaphoretic.  She has continued to feel unsteady on her feet as well, but has had no palpitations or syncope.  No headache, speech deficits or focal motor weakness.  She walked into our office in [place] with these symptoms, was assessed and sent to the ER at [facility] via EMS where I evaluated her.  He states that the symptoms are similar to previous angina.  She reports compliance with her typical medications.  Initial lab work shows troponin I 0.03.  ECG shows sinus bradycardia with predominantly inferolateral ST-T wave abnormalities that are nonspecific but concerning for ischemia, more pronounced in the inferior leads than on prior tracing.   She does have a history of bradycardia, but tolerates fairly high dose Lopressor at home.  Heart rate at last clinic visit was in the 60s, she is in the 40s-50s in the ER.   Past Medical History:  Diagnosis Date  . Bradycardia   . Cataracts, bilateral   . Coronary atherosclerosis of native coronary artery    DES circ 10/10, residual 100% occluded SVG to OM, patent LIMA to LAD and SVG to PDA grafts, LVEF 50%  . GERD  (gastroesophageal reflux disease)   . Hyperlipemia   . Hypertension   . MI (myocardial infarction) (HCC)   . OSA (obstructive sleep apnea)    On CPAP  . Palpitations    Transient PAT/AF, CHADS2 score 1    Past Surgical History:  Procedure Laterality Date  . ABDOMINAL HYSTERECTOMY    . CARPAL TUNNEL RELEASE    . CORONARY ARTERY BYPASS GRAFT     [facility], LIMA to LAD, SVG to OM, SVG to PDA  . LEFT HEART CATHETERIZATION WITH CORONARY/GRAFT ANGIOGRAM N/A 12/27/2012   Procedure: LEFT HEART CATHETERIZATION WITH Isabel Caprice;  Surgeon: Herby Abraham, MD;  Location: [facility] CATH LAB;  Service: Cardiovascular;  Laterality: N/A;     Medications Prior to Admission: Prior to Admission medications   Medication Sig Start Date End Date Taking? Authorizing Provider  albuterol (VENTOLIN HFA) 108 (90 Base) MCG/ACT inhaler Inhale 1 puff into the lungs as needed.  12/19/16  Yes [provider]  amLODipine (NORVASC) 5 MG tablet Take 1 tablet (5 mg total) by mouth daily. 11/23/17  Yes Jonelle Sidle, MD  aspirin EC 81 MG tablet Take 1 tablet (81 mg total) by mouth daily. 01/14/13  Yes Serpe, Clide Deutscher, PA-C  clopidogrel (PLAVIX) 75 MG tablet Take 1 tablet (75 mg total) by mouth daily. 11/23/17  Yes Jonelle Sidle, MD  fluticasone [facility]) 50 MCG/ACT nasal spray Place 2 sprays into both nostrils daily. 08/03/16  Yes [provider]  hydrALAZINE (APRESOLINE) 50 MG tablet Take 1.5 tablets (75 mg total) by mouth 3 (three) times daily. 11/23/17  Yes Jonelle Sidle, MD  hydrochlorothiazide (HYDRODIURIL) 25 MG tablet TAKE 1 TABLET BY MOUTH EVERY DAY 04/02/17  Yes Jonelle Sidle, MD  ibuprofen (ADVIL,MOTRIN) 800 MG tablet Take 800 mg by mouth every 8 (eight) hours as needed.   Yes [provider]  isosorbide mononitrate (IMDUR) 60 MG 24 hr tablet Take 1 tablet (60 mg total) by mouth daily. 11/23/17  Yes Jonelle Sidle, MD  losartan (COZAAR) 100 MG tablet Take 1  tablet (100 mg total) by mouth daily. 11/23/17  Yes Jonelle Sidle, MD  magnesium oxide (MAG-OX) 400 (241.3 Mg) MG tablet Take 1 tablet by mouth daily. 06/03/16  Yes [provider]  metoprolol tartrate (LOPRESSOR) 100 MG tablet Take 1 tablet (100 mg total) by mouth 2 (two) times daily. 11/23/17  Yes Jonelle Sidle, MD  nitroGLYCERIN (NITROSTAT) 0.4 MG SL tablet Place 1 tablet (0.4 mg total) under the tongue every 5 (five) minutes x 3 doses as needed for chest pain. 02/05/16  Yes Jonelle Sidle, MD  ranitidine (ZANTAC) 300 MG tablet Take 300 mg by mouth at bedtime.   Yes [provider]  simvastatin (ZOCOR) 40 MG tablet TAKE 1 TABLET BY MOUTH EVERYDAY AT BEDTIME 11/23/17  Yes Jonelle Sidle, MD  HYDROcodone-acetaminophen (NORCO) 7.5-325 MG tablet Take 1 tablet by mouth every 6 (six) hours as needed for moderate pain.    [provider]     Allergies:   No Known Allergies  Social History:   Social History   Socioeconomic History  . Marital status: Married    Spouse name: Not on file  . Number of children: Not on file  . Years of education: Not on file  . Highest education level: Not on file  Occupational History  . Occupation: Disabled  Social Needs  . Financial resource strain: Not on file  . Food insecurity:    Worry: Not on file    Inability: Not on file  . Transportation needs:    Medical: Not on file    Non-medical: Not on file  Tobacco Use  . Smoking status: Former Smoker    Packs/day: 0.75    Years: 43.00    Pack years: 32.25    Types: Cigarettes    Start date: 09/13/1965    Last attempt to quit: 10/14/2007    Years since quitting: 10.2  . Smokeless tobacco: Never Used  Substance and Sexual Activity  . Alcohol use: Yes    Alcohol/week: 0.0 oz    Comment: Occasional wine or beer  . Drug use: No  . Sexual activity: Not on file  Lifestyle  . Physical activity:    Days per week: Not on file    Minutes per session: Not on file  .  Stress: Not on file  Relationships  . Social connections:    Talks on phone: Not on file    Gets together: Not on file    Attends religious service: Not on file    Active member of club or organization: Not on file    Attends meetings of clubs or organizations: Not on file    Relationship status: Not on file  . Intimate partner violence:    Fear of current or ex partner: Not on file    Emotionally abused: Not on file    Physically abused: Not on file  Forced sexual activity: Not on file  Other Topics Concern  . Not on file  Social History Narrative  . Not on file    Family History:  The patient's family history includes Heart attack in her father; Hypertension in her brother.    ROS:  Please see the history of present illness.  No fevers or chills.  No visual changes.  All other ROS reviewed and negative.     Physical Exam/Data:   Vitals:   01/19/18 1230 01/19/18 1230 01/19/18 1300 01/19/18 1330  BP: (!) 147/73 (!) 165/64 (!) 152/59 (!) 174/81  Pulse: (!) 44 (!) 50 (!) 45 (!) 49  Resp: 13 12 18    Temp:      TempSrc:      SpO2: 98% 98% 98% 99%  Weight:      Height:        Intake/Output Summary (Last 24 hours) at 01/19/2018 1406 Last data filed at 01/19/2018 1052 Gross per 24 hour  Intake 150 ml  Output -  Net 150 ml   Filed Weights   01/19/18 1056  Weight: 187 lb (84.8 kg)   Body mass index is 31.12 kg/m.   Gen: Patient is in no acute distress. HEENT: Conjunctiva and lids normal, oropharynx clear. Neck: Supple, no elevated JVP or carotid bruits, no thyromegaly. Lungs: Clear to auscultation, nonlabored breathing at rest. Cardiac: Regular rate and rhythm, no S3, soft systolic murmur. Abdomen: Soft, nontender, bowel sounds present, no guarding or rebound. Extremities: No pitting edema, distal pulses 2+. Skin: Warm and dry. Musculoskeletal: No kyphosis. Neuropsychiatric: Alert and oriented x3, affect grossly appropriate.  No nystagmus or focal motor  weakness.  EKG:  I personally reviewed the tracing from sinus bradycardia with predominantly inferolateral ST-T wave abnormalities that are nonspecific but concerning for ischemia, more pronounced in the inferior leads than on prior tracing.   Relevant CV Studies:  Cardiac catheterization 12/27/2012: Procedural Findings: Hemodynamics:  AO 167/76 LV 176/22              Coronary angiography: Coronary dominance: right  Left mainstem: Long, calcified vessel, distal 40-50% stenosis  Left anterior descending (LAD): Total occlusion proximally. Fills from the LIMA.  Left circumflex (LCx): Tortuous, patent vessel. Large intermediate branch is patent. Proximal and mid-vessel stents are patent without significant ISR.  Right coronary artery (RCA): 100% proximal stenosis (total occlusion)  SVG - PDA: patent with mild diffuse irregularity. PDA is patent, PLA is patent  LIMA - LAD: widely patent. Distal anastomosis is patent  Left ventriculography: Left ventricular systolic function is low normal. There is mild inferior wall hypokinesis. LVEF is estimated at 50%.  Abdominal aortography: no aneurysm. Diffuse irregularity of the infrarenal aorta. Renal arteries are patent. Diffuse iliac disease but iliacs are poorly visualized.  Final Conclusions:   1. Severe 3 vessel CAD with total occlusion of the RCA, patent stents in the LCx, and total occlusion of the LAD 2. Continued patency of the LIMA to LAD and SVG to PDA. 3. Known occlusion of the SVG to OM 4. Mild LV dysfunction with preserved LVEF  Laboratory Data:  Chemistry Recent Labs  Lab 01/19/18 1122  NA 139  K 3.9  CL 104  CO2 26  GLUCOSE 95  BUN 18  CREATININE 0.71  CALCIUM 9.1  GFRNONAA >60  GFRAA >60  ANIONGAP 9    No results for input(s): PROT, ALBUMIN, AST, ALT, ALKPHOS, BILITOT in the last 168 hours. Hematology Recent Labs  Lab 01/19/18 1122  WBC  5.2  RBC 4.63  HGB 14.3  HCT 43.9  MCV 94.8  MCH 30.9   MCHC 32.6  RDW 13.3  PLT 205   Cardiac EnzymesNo results for input(s): TROPONINI in the last 168 hours.  Recent Labs  Lab 01/19/18 1133  TROPIPOC 0.03     Radiology/Studies:  Ct Angio Head W Or Wo Contrast  Result Date: 01/19/2018 CLINICAL DATA:  Acute presentation with dizziness beginning today. 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 EXAM: CT ANGIOGRAPHY HEAD AND NECK TECHNIQUE: Multidetector CT imaging of the head and neck was performed using the standard protocol during bolus administration of intravenous contrast. Multiplanar CT image reconstructions and MIPs were obtained to evaluate the vascular anatomy. Carotid stenosis measurements (when applicable) are obtained utilizing NASCET criteria, using the distal internal carotid diameter as the denominator. CONTRAST:  75mL ISOVUE-370 IOPAMIDOL (ISOVUE-370) INJECTION 76% COMPARISON:  CT same day FINDINGS: CTA NECK FINDINGS Aortic arch: Pronounced aortic atherosclerosis. No aneurysm or dissection. Branching pattern of the brachiocephalic vessels from the arch is normal. No origin stenosis. Ectasia of the proximal subclavian artery. Right carotid system: Common carotid artery shows atherosclerotic plaque but is widely patent to the bifurcation. Soft and calcified plaque at the carotid bifurcation and ICA bulb. Minimal diameter of the proximal ICA is 5 mm, this same as the more distal cervical ICA. Therefore there is no stenosis. 70% stenosis of the proximal ECA. Marked tortuosity of the cervical ICA. In the region of maximal tortuosity, at the C3 level, there is irregularity of the ICA consistent with fibromuscular disease. There is a pseudo aneurysm in this location measuring 6 mm in diameter. Left carotid system: Common carotid artery shows some non stenotic plaque and is widely patent to the bifurcation region. Calcified plaque at the carotid bifurcation and ICA bulb. Minimal diameter in the ICA bulb is 4 mm. Compared to a more distal cervical  ICA diameter of 5 mm, this indicates a 20% stenosis. 70% stenosis of the proximal ECA. Cervical internal carotid artery is markedly tortuous on this side as well. No pseudo aneurysm seen on this side. Beneath the skull base, the vessel is ectatic measuring up to 7 mm in diameter. Vertebral arteries: Right vertebral artery origin is widely patent. 30% stenosis of the left vertebral artery origin. Both proximal vertebral arteries are markedly tortuous. Beyond that, the vertebral arteries show some scattered atherosclerotic plaque but are widely patent through the cervical region to the foramen magnum. Skeleton: Mid cervical spondylosis with prominent osteophytes at C5-6 and C6-7. Other neck: No mass or lymphadenopathy. Upper chest: Emphysema and pulmonary scarring.  No active process. Review of the MIP images confirms the above findings CTA HEAD FINDINGS Anterior circulation: Both internal carotid arteries are patent through the skull base and siphon regions. There is peripheral atherosclerotic calcification in the carotid siphon regions but no stenosis greater than 30%. Supraclinoid internal carotid arteries are widely patent. The anterior and middle cerebral vessels are widely patent. No stenosis, aneurysm or vascular malformation. Posterior circulation: Both vertebral arteries are patent at the foramen magnum level. There is atherosclerotic plaque of the V4 segment on the left with 30% stenosis. Both vertebral arteries are patent to the basilar. No basilar stenosis. There is a basilar tip aneurysm measuring 9 mm in diameter with a wide mouth. The aneurysm projects slightly more towards the left. Diameter of the mouth is 4 mm. The aneurysm originates between the left superior cerebellar and left posterior cerebral arteries. Venous sinuses: Patent and normal. Anatomic variants:  None significant Delayed phase: No abnormal enhancement. Review of the MIP images confirms the above findings IMPRESSION: The study confirms  the presence of a distal basilar aneurysm measuring 9 mm in diameter with a 4 mm mouth, projecting towards the left with the origin between the left superior cerebellar and posterior cerebral arteries. Advanced aortic atherosclerosis. Atherosclerotic change at both carotid bifurcations. No stenosis of the right ICA. 20% stenosis of the left ICA. Both cervical internal carotid arteries are markedly tortuous. There is probably bilateral fibromuscular change, more pronounced on the right. 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 Electronically Signed   By: Paulina Fusi M.D.   On: 01/19/2018 13:54   Ct Head Wo Contrast  Result Date: 01/19/2018 CLINICAL DATA:  Ataxia and dizziness.  Hypertension. EXAM: CT HEAD WITHOUT CONTRAST TECHNIQUE: Contiguous axial images were obtained from the base of the skull through the vertex without intravenous contrast. COMPARISON:  None. FINDINGS: Brain: The ventricles are normal in size and configuration. There is no intracranial mass, hemorrhage, extra-axial fluid collection, or midline shift. There is slight small vessel disease in the centra semiovale bilaterally. Elsewhere gray-white compartments appear normal. No evident acute infarct. Vascular: There is a 1.0 x 0.9 x 0.7 cm aneurysm arising at the distal basilar artery extending marginally toward the left. There is no hyperdense vessel. There is calcification in each carotid siphon region as well as in the distal left vertebral artery. Skull: The bony calvarium appears intact. Sinuses/Orbits: There is mucosal thickening in several ethmoid air cells. Other visualized paranasal sinuses are clear. Orbits appear symmetric bilaterally. Other: Mastoid air cells are clear. IMPRESSION: 1. 1.0 x 0.9 x 0.7 cm aneurysm at the distal aspect of the basilar artery extending slightly toward the left. Advise correlation with CT angiography to further assess. An aneurysm of this size has an increased risk of rupture. 2.  Slight periventricular small vessel disease. No acute infarct or hemorrhage. No mass. No extra-axial fluid. 3.  Foci of arterial vascular calcification. 4.  Mucosal thickening in several ethmoid air cells. These results were called by telephone at the time of interpretation on 01/19/2018 at 12:58 pm to Dr. Derwood Kaplan , who verbally acknowledged these results. Electronically Signed   By: Bretta Bang III M.D.   On: 01/19/2018 13:02   Ct Angio Neck W And/or Wo Contrast  Result Date: 01/19/2018 CLINICAL DATA:  Acute presentation with dizziness beginning today. 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 EXAM: CT ANGIOGRAPHY HEAD AND NECK TECHNIQUE: Multidetector CT imaging of the head and neck was performed using the standard protocol during bolus administration of intravenous contrast. Multiplanar CT image reconstructions and MIPs were obtained to evaluate the vascular anatomy. Carotid stenosis measurements (when applicable) are obtained utilizing NASCET criteria, using the distal internal carotid diameter as the denominator. CONTRAST:  75mL ISOVUE-370 IOPAMIDOL (ISOVUE-370) INJECTION 76% COMPARISON:  CT same day FINDINGS: CTA NECK FINDINGS Aortic arch: Pronounced aortic atherosclerosis. No aneurysm or dissection. Branching pattern of the brachiocephalic vessels from the arch is normal. No origin stenosis. Ectasia of the proximal subclavian artery. Right carotid system: Common carotid artery shows atherosclerotic plaque but is widely patent to the bifurcation. Soft and calcified plaque at the carotid bifurcation and ICA bulb. Minimal diameter of the proximal ICA is 5 mm, this same as the more distal cervical ICA. Therefore there is no stenosis. 70% stenosis of the proximal ECA. Marked tortuosity of the cervical ICA. In the region of maximal  tortuosity, at the C3 level, there is irregularity of the ICA consistent with fibromuscular disease. There is a pseudo aneurysm in this location measuring 6 mm in  diameter. Left carotid system: Common carotid artery shows some non stenotic plaque and is widely patent to the bifurcation region. Calcified plaque at the carotid bifurcation and ICA bulb. Minimal diameter in the ICA bulb is 4 mm. Compared to a more distal cervical ICA diameter of 5 mm, this indicates a 20% stenosis. 70% stenosis of the proximal ECA. Cervical internal carotid artery is markedly tortuous on this side as well. No pseudo aneurysm seen on this side. Beneath the skull base, the vessel is ectatic measuring up to 7 mm in diameter. Vertebral arteries: Right vertebral artery origin is widely patent. 30% stenosis of the left vertebral artery origin. Both proximal vertebral arteries are markedly tortuous. Beyond that, the vertebral arteries show some scattered atherosclerotic plaque but are widely patent through the cervical region to the foramen magnum. Skeleton: Mid cervical spondylosis with prominent osteophytes at C5-6 and C6-7. Other neck: No mass or lymphadenopathy. Upper chest: Emphysema and pulmonary scarring.  No active process. Review of the MIP images confirms the above findings CTA HEAD FINDINGS Anterior circulation: Both internal carotid arteries are patent through the skull base and siphon regions. There is peripheral atherosclerotic calcification in the carotid siphon regions but no stenosis greater than 30%. Supraclinoid internal carotid arteries are widely patent. The anterior and middle cerebral vessels are widely patent. No stenosis, aneurysm or vascular malformation. Posterior circulation: Both vertebral arteries are patent at the foramen magnum level. There is atherosclerotic plaque of the V4 segment on the left with 30% stenosis. Both vertebral arteries are patent to the basilar. No basilar stenosis. There is a basilar tip aneurysm measuring 9 mm in diameter with a wide mouth. The aneurysm projects slightly more towards the left. Diameter of the mouth is 4 mm. The aneurysm originates  between the left superior cerebellar and left posterior cerebral arteries. Venous sinuses: Patent and normal. Anatomic variants: None significant Delayed phase: No abnormal enhancement. 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 Advanced aortic atherosclerosis. Atherosclerotic change at both carotid bifurcations. No stenosis of the right ICA. 20% stenosis of the left ICA. Both cervical internal carotid arteries are markedly tortuous. There is probably bilateral fibromuscular change, more pronounced on the right. 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 Electronically Signed   By: Paulina FusiMark  Shogry M.D.   On: 01/19/2018 13:54   Dg Chest Portable 1 View  Result Date: 01/19/2018 CLINICAL DATA:  67 year old female with chest pain and bradycardia. Pain radiating to the left arm with numbness in the jaw. EXAM: PORTABLE CHEST 1 VIEW COMPARISON:  None available. FINDINGS: Portable AP upright view at 1148 hours. Cardiomegaly. Prior median sternotomy and CABG. Bilateral increased pulmonary interstitial markings suggesting vascular congestion. No pneumothorax, pleural effusion or consolidation. No acute osseous abnormality identified. IMPRESSION: No prior study. Acute pulmonary interstitial edema suspected. Cardiomegaly and prior CABG. Electronically Signed   By: Odessa FlemingH  Hall M.D.   On: 01/19/2018 12:04    Assessment and Plan:   1.  Presenting symptoms concerning for unstable angina.  ECG although chronically abnormal does show more prominent inferior ST-T wave abnormalities.  Initial troponin I is normal.  Chest x-ray indicates possible pulmonary edema as well.  2.  Multivessel CAD status post CABG.  Cardiac catheterization from 2014 showed chronically  occluded RCA and LAD, patent circumflex DES, occluded SVG to the OM, and patent LIMA to the LAD as well as SVG to the PDA.  She has been managed medically and reports compliance with current therapy.  She remains on dual antiplatelet therapy.  3.  Patient reported dizziness and unsteadiness as part of her presentation.  Workup in the ER included head CT and head CTA.  There does not look to be any acute finding.  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  4.  Sinus bradycardia, heart rate trend is somewhat lower at this time than usual.  She has otherwise tolerated high-dose Lopressor as an outpatient.  5.  Hyperlipidemia, on Zocor.  Plan at this time is transfer via [facility] to our cardiology service at [facility] in anticipation of a diagnostic cardiac catheterization.  Dr. Eden Emms is the accepting physician.  Would have head CT and head CTA findings reviewed with Neurosurgery to sort out how the distal basilar artery aneurysm will need to be followed and managed.  Signed, Nona Dell, MD  01/19/2018 2:06 PM

## 2018-01-19 NOTE — ED Provider Notes (Addendum)
[facility] Provider Note   CSN: [id number] Arrival date & time: 01/19/18  1037     History   Chief Complaint Chief Complaint  Patient presents with  . Chest Pain    HPI Heather Livingston is a 67 y.o. female.  HPI  67 year old female comes in with chief complaint of chest pain. Patient has known history of CAD, status post CABG and PCI placement. Patient comes to the ER with chief complaint of chest tightness, neck tightness and jaw numbness that started while she was getting ready to go to the store about an hour ago.  Patient states that she also has been having dizziness when she is walking.  Patient has no associated new numbness or tingling in her extremities or vision changes.  Patient does not have any history of stroke.  Additionally patient states that her symptoms initially began with just the dizziness when she is walking or standing, and those symptoms started yesterday.  Patient's chest discomfort and jaw symptoms are similar to her ACS/MI type pain, therefore she went to her PCP, who promptly directed her to the emergency department.  Past Medical History:  Diagnosis Date  . Bradycardia   . Cataracts, bilateral   . Coronary atherosclerosis of native coronary artery    DES circ 10/10, residual 100% occluded SVG to OM, patent LIMA to LAD and SVG to PDA grafts, LVEF 50%  . GERD (gastroesophageal reflux disease)   . Hyperlipemia   . Hypertension   . MI (myocardial infarction) (HCC)   . OSA (obstructive sleep apnea)    On CPAP  . Palpitations    Transient PAT/AF, CHADS2 score 1    Patient Active Problem List   Diagnosis Date Noted  . Unstable angina (HCC) 01/19/2018  . Essential hypertension, benign 10/29/2010  . CORONARY ATHEROSCLEROSIS NATIVE CORONARY ARTERY 10/29/2010  . SUPRAVENTRICULAR TACHYCARDIA 09/13/2010  . DYSLIPIDEMIA 08/27/2010  . 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 Palpitations 08/27/2010    Past Surgical  History:  Procedure Laterality Date  . ABDOMINAL HYSTERECTOMY    . CARPAL TUNNEL RELEASE    . CORONARY ARTERY BYPASS GRAFT     [facility], LIMA to LAD, SVG to OM, SVG to PDA  . LEFT HEART CATHETERIZATION WITH CORONARY/GRAFT ANGIOGRAM N/A 12/27/2012   Procedure: LEFT HEART CATHETERIZATION WITH Isabel CapriceORONARY/GRAFT ANGIOGRAM;  Surgeon: Herby Abrahamhomas D Stuckey, MD;  [facility];  Service: Cardiovascular;  Laterality: N/A;     OB History   None      Home Medications    Prior to Admission medications   Medication Sig Start Date End Date Taking? Authorizing Provider  albuterol (VENTOLIN HFA) 108 (90 Base) MCG/ACT inhaler Inhale 1 puff into the lungs as needed.  12/19/16  Yes [provider]  amLODipine (NORVASC) 5 MG tablet Take 1 tablet (5 mg total) by mouth daily. 11/23/17  Yes Jonelle SidleMcDowell, Samuel G, MD  aspirin EC 81 MG tablet Take 1 tablet (81 mg total) by mouth daily. 01/14/13  Yes Serpe, Clide DeutscherEugene C, PA-C  clopidogrel (PLAVIX) 75 MG tablet Take 1 tablet (75 mg total) by mouth daily. 11/23/17  Yes Jonelle SidleMcDowell, Samuel G, MD  fluticasone [facility](FLONASE) 50 MCG/ACT nasal spray Place 2 sprays into both nostrils daily. 08/03/16  Yes [provider]  hydrALAZINE (APRESOLINE) 50 MG tablet Take 1.5 tablets (75 mg total) by mouth 3 (three) times daily. 11/23/17  Yes Jonelle SidleMcDowell, Samuel G, MD  hydrochlorothiazide (HYDRODIURIL) 25 MG tablet TAKE 1 TABLET BY MOUTH  EVERY DAY 04/02/17  Yes Jonelle Sidle, MD  ibuprofen (ADVIL,MOTRIN) 800 MG tablet Take 800 mg by mouth every 8 (eight) hours as needed.   Yes [provider]  isosorbide mononitrate (IMDUR) 60 MG 24 hr tablet Take 1 tablet (60 mg total) by mouth daily. 11/23/17  Yes Jonelle Sidle, MD  losartan (COZAAR) 100 MG tablet Take 1 tablet (100 mg total) by mouth daily. 11/23/17  Yes Jonelle Sidle, MD  magnesium oxide (MAG-OX) 400 (241.3 Mg) MG tablet Take 1 tablet by mouth daily. 06/03/16  Yes [provider]  metoprolol tartrate  (LOPRESSOR) 100 MG tablet Take 1 tablet (100 mg total) by mouth 2 (two) times daily. 11/23/17  Yes Jonelle Sidle, MD  nitroGLYCERIN (NITROSTAT) 0.4 MG SL tablet Place 1 tablet (0.4 mg total) under the tongue every 5 (five) minutes x 3 doses as needed for chest pain. 02/05/16  Yes Jonelle Sidle, MD  ranitidine (ZANTAC) 300 MG tablet Take 300 mg by mouth at bedtime.   Yes [provider]  simvastatin (ZOCOR) 40 MG tablet TAKE 1 TABLET BY MOUTH EVERYDAY AT BEDTIME 11/23/17  Yes Jonelle Sidle, MD  HYDROcodone-acetaminophen (NORCO) 7.5-325 MG tablet Take 1 tablet by mouth every 6 (six) hours as needed for moderate pain.    [provider]    Family History Family History  Problem Relation Age of Onset  . Heart attack Father   . Hypertension Brother     Social History Social History   Tobacco Use  . Smoking status: Former Smoker    Packs/day: 0.75    Years: 43.00    Pack years: 32.25    Types: Cigarettes    Start date: 09/13/1965    Last attempt to quit: 10/14/2007    Years since quitting: 10.2  . Smokeless tobacco: Never Used  Substance Use Topics  . Alcohol use: Yes    Alcohol/week: 0.0 oz    Comment: Occasional wine or beer  . Drug use: No     Allergies   Patient has no known allergies.   Review of Systems Review of Systems  Constitutional: Positive for activity change.  Respiratory: Positive for chest tightness. Negative for shortness of breath.   Cardiovascular: Positive for chest pain.  Musculoskeletal: Positive for neck pain. Negative for back pain.  Neurological: Positive for dizziness and numbness. Negative for speech difficulty and weakness.     Physical Exam Updated Vital Signs BP (!) 174/81   Pulse (!) 49   Temp 97.7 F (36.5 C) (Oral)   Resp 18   Ht 5\' 5"  (1.651 m)   Wt 84.8 kg (187 lb)   SpO2 99%   BMI 31.12 kg/m   Physical Exam  Constitutional: She is oriented to person, place, and time. She appears well-developed.    HENT:  Head: Normocephalic and atraumatic.  Eyes: EOM are normal.  Neck: Normal range of motion. Neck supple.  Cardiovascular: Normal rate, intact distal pulses and normal pulses.  Murmur heard. Pulmonary/Chest: Effort normal.  Abdominal: Bowel sounds are normal.  Musculoskeletal:       Right lower leg: She exhibits no edema.       Left lower leg: She exhibits no edema.  Neurological: She is alert and oriented to person, place, and time.  Cerebellar exam is normal (finger to nose) Sensory exam normal for bilateral upper and lower extremities - and patient is able to discriminate between sharp and dull. Motor exam is 4+/5  Patient had  unsteady gait  Skin: Skin is warm and dry.  Nursing note and vitals reviewed.    ED Treatments / Results  Labs (all labs ordered are listed, but only abnormal results are displayed) Labs Reviewed  BASIC METABOLIC PANEL  CBC  MAGNESIUM  APTT  PROTIME-INR  I-STAT TROPONIN, ED    EKG EKG Interpretation  Date/Time:  Tuesday January 19 2018 11:01:34 EDT Ventricular Rate:  41 PR Interval:    QRS Duration: 110 QT Interval:  473 QTC Calculation: 391 R Axis:   75 Text Interpretation:  Sinus bradycardia Nonspecific T abnormalities, diffuse leads No acute changes No significant change since last tracing Confirmed by Derwood Kaplan [phone]) on 01/19/2018 11:36:33 AM   Radiology Ct Angio Head W Or Wo Contrast  Result Date: 01/19/2018 CLINICAL DATA:  Acute presentation with dizziness beginning today. 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 EXAM: CT ANGIOGRAPHY HEAD AND NECK TECHNIQUE: Multidetector CT imaging of the head and neck was performed using the standard protocol during bolus administration of intravenous contrast. Multiplanar CT image reconstructions and MIPs were obtained to evaluate the vascular anatomy. Carotid stenosis measurements (when applicable) are obtained utilizing NASCET criteria, using the distal internal carotid diameter as the  denominator. CONTRAST:  75mL ISOVUE-370 IOPAMIDOL (ISOVUE-370) INJECTION 76% COMPARISON:  CT same day FINDINGS: CTA NECK FINDINGS Aortic arch: Pronounced aortic atherosclerosis. No aneurysm or dissection. Branching pattern of the brachiocephalic vessels from the arch is normal. No origin stenosis. Ectasia of the proximal subclavian artery. Right carotid system: Common carotid artery shows atherosclerotic plaque but is widely patent to the bifurcation. Soft and calcified plaque at the carotid bifurcation and ICA bulb. Minimal diameter of the proximal ICA is 5 mm, this same as the more distal cervical ICA. Therefore there is no stenosis. 70% stenosis of the proximal ECA. Marked tortuosity of the cervical ICA. In the region of maximal tortuosity, at the C3 level, there is irregularity of the ICA consistent with fibromuscular disease. There is a pseudo aneurysm in this location measuring 6 mm in diameter. Left carotid system: Common carotid artery shows some non stenotic plaque and is widely patent to the bifurcation region. Calcified plaque at the carotid bifurcation and ICA bulb. Minimal diameter in the ICA bulb is 4 mm. Compared to a more distal cervical ICA diameter of 5 mm, this indicates a 20% stenosis. 70% stenosis of the proximal ECA. Cervical internal carotid artery is markedly tortuous on this side as well. No pseudo aneurysm seen on this side. Beneath the skull base, the vessel is ectatic measuring up to 7 mm in diameter. Vertebral arteries: Right vertebral artery origin is widely patent. 30% stenosis of the left vertebral artery origin. Both proximal vertebral arteries are markedly tortuous. Beyond that, the vertebral arteries show some scattered atherosclerotic plaque but are widely patent through the cervical region to the foramen magnum. Skeleton: Mid cervical spondylosis with prominent osteophytes at C5-6 and C6-7. Other neck: No mass or lymphadenopathy. Upper chest: Emphysema and pulmonary scarring.  No  active process. Review of the MIP images confirms the above findings CTA HEAD FINDINGS Anterior circulation: Both internal carotid arteries are patent through the skull base and siphon regions. There is peripheral atherosclerotic calcification in the carotid siphon regions but no stenosis greater than 30%. Supraclinoid internal carotid arteries are widely patent. The anterior and middle cerebral vessels are widely patent. No stenosis, aneurysm or vascular malformation. Posterior circulation: Both vertebral arteries are patent at the foramen magnum level. There is atherosclerotic plaque of the  V4 segment on the left with 30% stenosis. Both vertebral arteries are patent to the basilar. No basilar stenosis. There is a basilar tip aneurysm measuring 9 mm in diameter with a wide mouth. The aneurysm projects slightly more towards the left. Diameter of the mouth is 4 mm. The aneurysm originates between the left superior cerebellar and left posterior cerebral arteries. Venous sinuses: Patent and normal. Anatomic variants: None significant Delayed phase: No abnormal enhancement. 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 Advanced aortic atherosclerosis. Atherosclerotic change at both carotid bifurcations. No stenosis of the right ICA. 20% stenosis of the left ICA. Both cervical internal carotid arteries are markedly tortuous. There is probably bilateral fibromuscular change, more pronounced on the right. 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 Electronically Signed   By: Paulina Fusi M.D.   On: 01/19/2018 13:54   Ct Head Wo Contrast  Result Date: 01/19/2018 CLINICAL DATA:  Ataxia and dizziness.  Hypertension. EXAM: CT HEAD WITHOUT CONTRAST TECHNIQUE:  Contiguous axial images were obtained from the base of the skull through the vertex without intravenous contrast. COMPARISON:  None. FINDINGS: Brain: The ventricles are normal in size and configuration. There is no intracranial mass, hemorrhage, extra-axial fluid collection, or midline shift. There is slight small vessel disease in the centra semiovale bilaterally. Elsewhere gray-white compartments appear normal. No evident acute infarct. Vascular: There is a 1.0 x 0.9 x 0.7 cm aneurysm arising at the distal basilar artery extending marginally toward the left. There is no hyperdense vessel. There is calcification in each carotid siphon region as well as in the distal left vertebral artery. Skull: The bony calvarium appears intact. Sinuses/Orbits: There is mucosal thickening in several ethmoid air cells. Other visualized paranasal sinuses are clear. Orbits appear symmetric bilaterally. Other: Mastoid air cells are clear. IMPRESSION: 1. 1.0 x 0.9 x 0.7 cm aneurysm at the distal aspect of the basilar artery extending slightly toward the left. Advise correlation with CT angiography to further assess. An aneurysm of this size has an increased risk of rupture. 2. Slight periventricular small vessel disease. No acute infarct or hemorrhage. No mass. No extra-axial fluid. 3.  Foci of arterial vascular calcification. 4.  Mucosal thickening in several ethmoid air cells. These results were called by telephone at the time of interpretation on 01/19/2018 at 12:58 pm to Dr. Derwood Kaplan , who verbally acknowledged these results. Electronically Signed   By: Bretta Bang III M.D.   On: 01/19/2018 13:02   Ct Angio Neck W And/or Wo Contrast  Result Date: 01/19/2018 CLINICAL DATA:  Acute presentation with dizziness beginning today. 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 EXAM: CT ANGIOGRAPHY HEAD AND NECK TECHNIQUE: Multidetector CT imaging of the head and neck was performed using the standard protocol during bolus  administration of intravenous contrast. Multiplanar CT image reconstructions and MIPs were obtained to evaluate the vascular anatomy. Carotid stenosis measurements (when applicable) are obtained utilizing NASCET criteria, using the distal internal carotid diameter as the denominator. CONTRAST:  75mL ISOVUE-370 IOPAMIDOL (ISOVUE-370) INJECTION 76% COMPARISON:  CT same day FINDINGS: CTA NECK FINDINGS Aortic arch: Pronounced aortic atherosclerosis. No aneurysm or dissection. Branching pattern of the brachiocephalic vessels from the arch is normal. No origin stenosis. Ectasia of the  proximal subclavian artery. Right carotid system: Common carotid artery shows atherosclerotic plaque but is widely patent to the bifurcation. Soft and calcified plaque at the carotid bifurcation and ICA bulb. Minimal diameter of the proximal ICA is 5 mm, this same as the more distal cervical ICA. Therefore there is no stenosis. 70% stenosis of the proximal ECA. Marked tortuosity of the cervical ICA. In the region of maximal tortuosity, at the C3 level, there is irregularity of the ICA consistent with fibromuscular disease. There is a pseudo aneurysm in this location measuring 6 mm in diameter. Left carotid system: Common carotid artery shows some non stenotic plaque and is widely patent to the bifurcation region. Calcified plaque at the carotid bifurcation and ICA bulb. Minimal diameter in the ICA bulb is 4 mm. Compared to a more distal cervical ICA diameter of 5 mm, this indicates a 20% stenosis. 70% stenosis of the proximal ECA. Cervical internal carotid artery is markedly tortuous on this side as well. No pseudo aneurysm seen on this side. Beneath the skull base, the vessel is ectatic measuring up to 7 mm in diameter. Vertebral arteries: Right vertebral artery origin is widely patent. 30% stenosis of the left vertebral artery origin. Both proximal vertebral arteries are markedly tortuous. Beyond that, the vertebral arteries show some  scattered atherosclerotic plaque but are widely patent through the cervical region to the foramen magnum. Skeleton: Mid cervical spondylosis with prominent osteophytes at C5-6 and C6-7. Other neck: No mass or lymphadenopathy. Upper chest: Emphysema and pulmonary scarring.  No active process. Review of the MIP images confirms the above findings CTA HEAD FINDINGS Anterior circulation: Both internal carotid arteries are patent through the skull base and siphon regions. There is peripheral atherosclerotic calcification in the carotid siphon regions but no stenosis greater than 30%. Supraclinoid internal carotid arteries are widely patent. The anterior and middle cerebral vessels are widely patent. No stenosis, aneurysm or vascular malformation. Posterior circulation: Both vertebral arteries are patent at the foramen magnum level. There is atherosclerotic plaque of the V4 segment on the left with 30% stenosis. Both vertebral arteries are patent to the basilar. No basilar stenosis. There is a basilar tip aneurysm measuring 9 mm in diameter with a wide mouth. The aneurysm projects slightly more towards the left. Diameter of the mouth is 4 mm. The aneurysm originates between the left superior cerebellar and left posterior cerebral arteries. Venous sinuses: Patent and normal. Anatomic variants: None significant Delayed phase: No abnormal enhancement. 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 Advanced aortic atherosclerosis. Atherosclerotic change at both carotid bifurcations. No stenosis of the right ICA. 20% stenosis of the left ICA. Both cervical internal carotid arteries are markedly tortuous. There is probably bilateral fibromuscular change, more pronounced on the right. 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 Electronically Signed   By: Paulina Fusi M.D.   On: 01/19/2018 13:54   Dg Chest Portable 1 View  Result Date: 01/19/2018 CLINICAL DATA:  67 year old female with chest pain and bradycardia. Pain radiating to the left arm with numbness in the jaw. EXAM: PORTABLE CHEST 1 VIEW COMPARISON:  None available. FINDINGS: Portable AP upright view at 1148 hours. Cardiomegaly. Prior median sternotomy and CABG. Bilateral increased pulmonary interstitial markings suggesting  vascular congestion. No pneumothorax, pleural effusion or consolidation. No acute osseous abnormality identified. IMPRESSION: No prior study. Acute pulmonary interstitial edema suspected. Cardiomegaly and prior CABG. Electronically Signed   By: Odessa Fleming M.D.   On: 01/19/2018 12:04    Procedures Procedures (including critical care time) CRITICAL CARE Performed by: Jacai Kipp   Total critical care time: 59 minutes  Critical care time was exclusive of separately billable procedures and treating other patients.  Critical care was necessary to treat or prevent imminent or life-threatening deterioration.  Critical care was time spent personally by me on the following activities: development of treatment plan with patient and/or surrogate as well as nursing, discussions with consultants, evaluation of patient's response to treatment, examination of patient, obtaining history from patient or surrogate, ordering and performing treatments and interventions, ordering and review of laboratory studies, ordering and review of radiographic studies, pulse oximetry and re-evaluation of patient's condition.   Medications Ordered in ED Medications  heparin injection 4,000 Units ( Intravenous MAR Hold 01/19/18 1508)  heparin ADULT infusion 100 units/mL (25000 units/265mL sodium chloride 0.45%) (has no administration in time range)  fentaNYL (SUBLIMAZE) injection 50 mcg (50 mcg Intravenous Given  01/19/18 1350)  iopamidol (ISOVUE-370) 76 % injection 75 mL (75 mLs Intravenous Contrast Given 01/19/18 1315)     Initial Impression / Assessment and Plan / ED Course  I have reviewed the triage vital signs and the nursing notes.  Pertinent labs & imaging results that were available during my care of the patient were reviewed by me and considered in my medical decision making (see chart for details).  Clinical Course as of Jan 19 1514  Tue Jan 19, 2018  1229 I spoke with Dr Diona Browner, Cardiology. He thinks patient will need an urgent cath and is requesting that pt be transferred to [facility].  Dr. Lynelle Doctor aware about ED-ED transfer between [facility] and [facility]. Dr. Diona Browner has started looking at the cath schedule, and will coordinate Cardiology care for the patient.    [AN]  1307 CT scan of the head shows 1 cm basilar aneurysm.  This could be causing the ataxic gait and dizziness that patient has, which is independent of her unstable angina.  We will order a CT angiogram of her head and neck.  Of note, heparin has been held until CT scans are clear.  We will consult neurosurgery as soon as the CT scans are done.   [AN]  1502 Ct showed no active bleed. The aneyrysm is large none the less, and I have paged Dr. Jordan Likes, Neurosurgery. I also asked Trish, the Cardmaster to inform Dr. Elease Hashimoto, Cardiology about this finding.   [AN]  1514 Dr. Jordan Likes will see the patient later in the day. He clears patient for Cards workup, as he doesn't think the ataxia is related to the basilar aneurysm. Cards made aware.   [AN]    Clinical Course User Index [AN] Derwood Kaplan, MD    67 year old woman with history of CAD comes in with chief complaint of chest discomfort and dizziness.  Patient has had similar type of chest tightness, jaw numbness and neck tightness with her MI.  Patient's symptoms started earlier in the morning and are constant.  Clinical concerns are high for unstable angina/NSTEMI.  EKG has no acute findings.   Given the dizziness/ataxia and chest discomfort I also considered dissection in my differential diagnosis, however patient is bradycardic, she has normal and equal bilateral upper and lower extremity pulses and she does not have  any neurologic symptoms associated with her upper or lower extremities -therefore pretest probability for dissection is extremely low.  In addition, patient states that she has been having dizziness and unsteady gait when she walks.  Patient does not have vertigo, and no dizziness present when she is at rest.  Patient's heart rate is noted to be in the 40s.  She has history of bradycardia due to her RCA disease, but it seems like her heart rate has not dropped in the 40s in the past.  My suspicion is that her dizziness with ambulation is because of her bradycardia, but we will get a CT scan of her head to ensure there is no bleed or any evidence of large stroke before we start heparin.  Final Clinical Impressions(s) / ED Diagnoses   Final diagnoses:  Unstable angina [facility])  Brain aneurysm    ED Discharge Orders    None       Derwood Kaplan, MD 01/19/18 1251    Derwood Kaplan, MD 01/19/18 1515

## 2018-01-19 NOTE — ED Notes (Signed)
Cardiologist at bedside.  

## 2018-01-19 NOTE — ED Notes (Signed)
Per patient's request, this nurse called patient's husband and gave status update. Husband's name is Kathlene NovemberMike, number is [phone]. Patient's spouse verbalized understanding.

## 2018-01-19 NOTE — ED Notes (Signed)
[facility] at bedside. Patient to be transferred to Cath Lab per [facility].

## 2018-01-19 NOTE — Consult Note (Signed)
I reviewed her angiographic images today with JV.  The patient appears to have aneurysmal changes to her aortoiliac arteries as well as stenosis within the right common iliac artery.  This will be best evaluated with CT angiogram.  I will follow-up her renal function tomorrow and order a CT angiogram of the chest abdomen and pelvis, when appropriate.  Heather Livingston Azel Gumina

## 2018-01-19 NOTE — ED Notes (Signed)
[facility] at bedside 

## 2018-01-19 NOTE — ED Notes (Signed)
Dr Salina AprilNanavatti gave order to give heparin at this time. Heparin given to [facility] to be infused as requested.

## 2018-01-19 NOTE — Progress Notes (Signed)
Patient walked in office stating she was having chest pain 6/10 that was radiating in her jaw and down her left am. Patient was very short of breath and having difficulty speaking due to shortness of breath. Patient was very clammy and having cold sweats. Patient states she was very nauseated as well. Patient having difficulty walking and keeping balance. Patient stated she has been very weak. Vitals obtained BP 188/82 HR 67 O2 97%. EMS called to transport patient to [facility]. Asa 81 mg x4 given to patient per dispatcher. Husband informed as well per patients request.

## 2018-01-19 NOTE — Interval H&P Note (Signed)
Cath Lab Visit (complete for each Cath Lab visit)  Clinical Evaluation Leading to the Procedure:   ACS: Yes.    Non-ACS:    Anginal Classification: CCS IV  Anti-ischemic medical therapy: Minimal Therapy (1 class of medications)  Non-Invasive Test Results: No non-invasive testing performed  Prior CABG: Previous CABG      History and Physical Interval Note:  01/19/2018 4:26 PM  Heather Livingston  has presented today for surgery, with the diagnosis of ua  The various methods of treatment have been discussed with the patient and family. After consideration of risks, benefits and other options for treatment, the patient has consented to  Procedure(s): LEFT HEART CATH AND CORS/GRAFTS ANGIOGRAPHY (N/A) as a surgical intervention .  The patient's history has been reviewed, patient examined, no change in status, stable for surgery.  I have reviewed the patient's chart and labs.  Questions were answered to the patient's satisfaction.     Lance MussJayadeep Mack Thurmon

## 2018-01-19 NOTE — ED Notes (Signed)
Per Dr Salina AprilNanavatti, do NOT give heparin due to head CT results. Heparin was never pulled from pyxis or started on this patient.

## 2018-01-19 NOTE — ED Notes (Signed)
Per Dr Salina AprilNanavatti, do not give nitro at this time. Also hold on giving heparin until after head CT scan has resulted.

## 2018-01-19 NOTE — Progress Notes (Signed)
On arrival to holding area, patient states she is seeing "ice cicles". Dr. Soledad GerlachVaranasa at bedside talking w/patient

## 2018-01-19 NOTE — Progress Notes (Signed)
Site area: rt groin fa sheath Site Prior to Removal:  Level 0 Pressure Applied For: 20 minutes Manual:   yes Patient Status During Pull:  stable Post Pull Site:  Level  0 Post Pull Instructions Given:  yes Post Pull Pulses Present: palpable rt dp Dressing Applied:  Gauze and tegaderm Bedrest begins @ 1810 Comments:  Visual disturbance is fading away

## 2018-01-19 NOTE — Progress Notes (Signed)
ANTICOAGULATION CONSULT NOTE - Initial Consult  Pharmacy Consult for heparin Indication: chest pain/ACS  No Known Allergies  Patient Measurements: Height: 5\' 5"  (165.1 cm) Weight: 187 lb (84.8 kg) IBW/kg (Calculated) : 57 Heparin Dosing Weight: 75.3 kg  Vital Signs: Temp: 97.7 F (36.5 C) (04/09 1055) Temp Source: Oral (04/09 1055) BP: 151/66 (04/09 1810) Pulse Rate: 64 (04/09 1810)  Labs: Recent Labs    01/19/18 1122  HGB 14.3  HCT 43.9  PLT 205  APTT 33  LABPROT 12.6  INR 0.95  CREATININE 0.71    Estimated Creatinine Clearance: 73.4 mL/min (by C-G formula based on SCr of 0.71 mg/dL).   Medical History: Past Medical History:  Diagnosis Date  . Bradycardia   . Cataracts, bilateral   . Coronary atherosclerosis of native coronary artery    DES circ 10/10, residual 100% occluded SVG to OM, patent LIMA to LAD and SVG to PDA grafts, LVEF 50%  . GERD (gastroesophageal reflux disease)   . Hyperlipemia   . Hypertension   . MI (myocardial infarction) (HCC)   . OSA (obstructive sleep apnea)    On CPAP  . Palpitations    Transient PAT/AF, CHADS2 score 1    Medications:  Medications Prior to Admission  Medication Sig Dispense Refill Last Dose  . albuterol (VENTOLIN HFA) 108 (90 Base) MCG/ACT inhaler Inhale 1 puff into the lungs as needed.    01/18/2018 at Unknown time  . amLODipine (NORVASC) 5 MG tablet Take 1 tablet (5 mg total) by mouth daily. 90 tablet 1 01/19/2018 at 0800  . aspirin EC 81 MG tablet Take 1 tablet (81 mg total) by mouth daily.   01/19/2018 at 0800  . clopidogrel (PLAVIX) 75 MG tablet Take 1 tablet (75 mg total) by mouth daily. 90 tablet 1 01/19/2018 at 0800  . fluticasone (FLONASE) 50 MCG/ACT nasal spray Place 2 sprays into both nostrils daily.  3 01/18/2018 at Unknown time  . hydrALAZINE (APRESOLINE) 50 MG tablet Take 1.5 tablets (75 mg total) by mouth 3 (three) times daily. 405 tablet 1 01/19/2018 at 0800  . hydrochlorothiazide (HYDRODIURIL) 25 MG tablet  TAKE 1 TABLET BY MOUTH EVERY DAY 90 tablet 3 01/19/2018 at 0800  . ibuprofen (ADVIL,MOTRIN) 800 MG tablet Take 800 mg by mouth every 8 (eight) hours as needed.   Taking  . isosorbide mononitrate (IMDUR) 60 MG 24 hr tablet Take 1 tablet (60 mg total) by mouth daily. 90 tablet 1 01/19/2018 at 0800  . losartan (COZAAR) 100 MG tablet Take 1 tablet (100 mg total) by mouth daily. 90 tablet 1 01/19/2018 at 0800  . magnesium oxide (MAG-OX) 400 (241.3 Mg) MG tablet Take 1 tablet by mouth daily.  3 01/19/2018 at Unknown time  . metoprolol tartrate (LOPRESSOR) 100 MG tablet Take 1 tablet (100 mg total) by mouth 2 (two) times daily. 180 tablet 1 01/19/2018 at 0800  . nitroGLYCERIN (NITROSTAT) 0.4 MG SL tablet Place 1 tablet (0.4 mg total) under the tongue every 5 (five) minutes x 3 doses as needed for chest pain. 25 tablet 3 Taking  . ranitidine (ZANTAC) 300 MG tablet Take 300 mg by mouth at bedtime.   01/18/2018 at 2130  . simvastatin (ZOCOR) 40 MG tablet TAKE 1 TABLET BY MOUTH EVERYDAY AT BEDTIME 90 tablet 1 01/18/2018 at 1930  . HYDROcodone-acetaminophen (NORCO) 7.5-325 MG tablet Take 1 tablet by mouth every 6 (six) hours as needed for moderate pain.   01/18/2018    Assessment: 67 yo lady to start  heparin post cath for ACS.   Goal of Therapy:  Heparin level 0.3-0.7 units/ml Monitor platelets by anticoagulation protocol: Yes   Plan:  Heparin drip at 900 units/hr Check heparin level and CBC daily while on heparin Monitor for bleeding complications  Talbert CageSeay, Elise Gladden Poteet 01/19/2018,7:47 PM

## 2018-01-20 ENCOUNTER — Encounter (HOSPITAL_COMMUNITY): Payer: Self-pay | Admitting: General Practice

## 2018-01-20 ENCOUNTER — Other Ambulatory Visit: Payer: Self-pay

## 2018-01-20 ENCOUNTER — Inpatient Hospital Stay (HOSPITAL_COMMUNITY): Payer: Medicare Other

## 2018-01-20 DIAGNOSIS — R410 Disorientation, unspecified: Secondary | ICD-10-CM | POA: Diagnosis not present

## 2018-01-20 DIAGNOSIS — I2582 Chronic total occlusion of coronary artery: Secondary | ICD-10-CM | POA: Diagnosis present

## 2018-01-20 DIAGNOSIS — Z7951 Long term (current) use of inhaled steroids: Secondary | ICD-10-CM | POA: Diagnosis not present

## 2018-01-20 DIAGNOSIS — Z955 Presence of coronary angioplasty implant and graft: Secondary | ICD-10-CM | POA: Diagnosis not present

## 2018-01-20 DIAGNOSIS — Z7982 Long term (current) use of aspirin: Secondary | ICD-10-CM | POA: Diagnosis not present

## 2018-01-20 DIAGNOSIS — Z7902 Long term (current) use of antithrombotics/antiplatelets: Secondary | ICD-10-CM | POA: Diagnosis not present

## 2018-01-20 DIAGNOSIS — I252 Old myocardial infarction: Secondary | ICD-10-CM | POA: Diagnosis not present

## 2018-01-20 DIAGNOSIS — I10 Essential (primary) hypertension: Secondary | ICD-10-CM | POA: Diagnosis present

## 2018-01-20 DIAGNOSIS — I25700 Atherosclerosis of coronary artery bypass graft(s), unspecified, with unstable angina pectoris: Secondary | ICD-10-CM | POA: Diagnosis present

## 2018-01-20 DIAGNOSIS — I25110 Atherosclerotic heart disease of native coronary artery with unstable angina pectoris: Secondary | ICD-10-CM | POA: Diagnosis present

## 2018-01-20 DIAGNOSIS — I671 Cerebral aneurysm, nonruptured: Secondary | ICD-10-CM | POA: Diagnosis present

## 2018-01-20 DIAGNOSIS — Z87891 Personal history of nicotine dependence: Secondary | ICD-10-CM | POA: Diagnosis not present

## 2018-01-20 DIAGNOSIS — K219 Gastro-esophageal reflux disease without esophagitis: Secondary | ICD-10-CM | POA: Diagnosis present

## 2018-01-20 DIAGNOSIS — E785 Hyperlipidemia, unspecified: Secondary | ICD-10-CM | POA: Diagnosis present

## 2018-01-20 DIAGNOSIS — Z8249 Family history of ischemic heart disease and other diseases of the circulatory system: Secondary | ICD-10-CM | POA: Diagnosis not present

## 2018-01-20 DIAGNOSIS — Z79899 Other long term (current) drug therapy: Secondary | ICD-10-CM | POA: Diagnosis not present

## 2018-01-20 DIAGNOSIS — R0789 Other chest pain: Secondary | ICD-10-CM | POA: Diagnosis present

## 2018-01-20 DIAGNOSIS — R001 Bradycardia, unspecified: Secondary | ICD-10-CM | POA: Diagnosis present

## 2018-01-20 DIAGNOSIS — I714 Abdominal aortic aneurysm, without rupture: Secondary | ICD-10-CM | POA: Diagnosis present

## 2018-01-20 DIAGNOSIS — Z90710 Acquired absence of both cervix and uterus: Secondary | ICD-10-CM | POA: Diagnosis not present

## 2018-01-20 DIAGNOSIS — G4733 Obstructive sleep apnea (adult) (pediatric): Secondary | ICD-10-CM | POA: Diagnosis present

## 2018-01-20 DIAGNOSIS — Z951 Presence of aortocoronary bypass graft: Secondary | ICD-10-CM | POA: Diagnosis not present

## 2018-01-20 DIAGNOSIS — I200 Unstable angina: Secondary | ICD-10-CM | POA: Diagnosis not present

## 2018-01-20 LAB — BASIC METABOLIC PANEL
Anion gap: 10 (ref 5–15)
BUN: 15 mg/dL (ref 6–20)
CALCIUM: 9 mg/dL (ref 8.9–10.3)
CHLORIDE: 105 mmol/L (ref 101–111)
CO2: 22 mmol/L (ref 22–32)
CREATININE: 0.72 mg/dL (ref 0.44–1.00)
GFR calc Af Amer: >60 mL/min (ref >60)
GFR calc non Af Amer: >60 mL/min (ref >60)
Glucose, Bld: 95 mg/dL (ref 65–99)
Potassium: 3.7 mmol/L (ref 3.5–5.1)
Sodium: 137 mmol/L (ref 135–145)

## 2018-01-20 LAB — CBC
HCT: 41.4 % (ref 36.0–46.0)
HEMOGLOBIN: 13.8 g/dL (ref 12.0–15.0)
MCH: 31.7 pg (ref 26.0–34.0)
MCHC: 33.3 g/dL (ref 30.0–36.0)
MCV: 95 fL (ref 78.0–100.0)
PLATELETS: 189 10*3/uL (ref 150–400)
RBC: 4.36 MIL/uL (ref 3.87–5.11)
RDW: 13.9 % (ref 11.5–15.5)
WBC: 4.8 10*3/uL (ref 4.0–10.5)

## 2018-01-20 LAB — LIPID PANEL
Cholesterol: 85 mg/dL (ref 0–200)
HDL: 25 mg/dL — ABNORMAL LOW (ref >40)
LDL CALC: 28 mg/dL (ref 0–99)
Total CHOL/HDL Ratio: 3.4 RATIO
Triglycerides: 162 mg/dL — ABNORMAL HIGH (ref <150)
VLDL: 32 mg/dL (ref 0–40)

## 2018-01-20 LAB — HEPARIN LEVEL (UNFRACTIONATED)
HEPARIN UNFRACTIONATED: 0.1 [IU]/mL — AB (ref 0.30–0.70)
HEPARIN UNFRACTIONATED: 0.2 [IU]/mL — AB (ref 0.30–0.70)
Heparin Unfractionated: <0.1 IU/mL — ABNORMAL LOW (ref 0.30–0.70)

## 2018-01-20 LAB — TROPONIN I: Troponin I: 0.03 ng/mL (ref <0.03)

## 2018-01-20 MED ORDER — IOPAMIDOL (ISOVUE-370) INJECTION 76%
100.0000 mL | Freq: Once | INTRAVENOUS | Status: AC | PRN
  Administered 2018-01-20: 100 mL via INTRAVENOUS

## 2018-01-20 MED ORDER — METOPROLOL TARTRATE 50 MG PO TABS
50.0000 mg | ORAL_TABLET | Freq: Two times a day (BID) | ORAL | Status: DC
  Administered 2018-01-20 – 2018-01-21 (×3): 50 mg via ORAL
  Filled 2018-01-20 (×2): qty 1

## 2018-01-20 MED ORDER — PNEUMOCOCCAL VAC POLYVALENT 25 MCG/0.5ML IJ INJ: 0.5000 mL | INJECTION | INTRAMUSCULAR | Status: DC

## 2018-01-20 MED ORDER — ATORVASTATIN CALCIUM 20 MG PO TABS
20.0000 mg | ORAL_TABLET | Freq: Every day | ORAL | Status: DC
  Administered 2018-01-20: 20 mg via ORAL
  Filled 2018-01-20: qty 1

## 2018-01-20 MED ORDER — IOPAMIDOL (ISOVUE-370) INJECTION 76%
INTRAVENOUS | Status: AC
  Filled 2018-01-20: qty 100

## 2018-01-20 NOTE — Progress Notes (Signed)
ANTICOAGULATION CONSULT NOTE - Follow Up Consult  Pharmacy Consult for heparin Indication: CAD  Labs: Recent Labs    01/19/18 1122 01/19/18 1952 01/20/18 0206  HGB 14.3  --   --   HCT 43.9  --   --   PLT 205  --   --   APTT 33  --   --   LABPROT 12.6  --   --   INR 0.95  --   --   HEPARINUNFRC  --   --  <0.10*  CREATININE 0.71  --   --   TROPONINI  --  <0.03 <0.03    Assessment: 66yo female subtherapeutic on heparin with initial dosing post-cath.  Goal of Therapy:  Heparin level 0.3-0.7 units/ml   Plan:  Will increase heparin gtt by 3 units/kg/hr to 1100 units/hr and check level in 6 hours.    Heather GamblesVeronda Kyona Livingston, PharmD, BCPS  01/20/2018,3:19 AM

## 2018-01-20 NOTE — Progress Notes (Signed)
ANTICOAGULATION CONSULT NOTE  Pharmacy Consult:  Heparin Indication: chest pain/ACS  No Known Allergies  Patient Measurements: Height: 5\' 5"  (165.1 cm) Weight: 215 lb 9.6 oz (97.8 kg)(weight difference i used standing weight scale nurse notifie) IBW/kg (Calculated) : 57 Heparin Dosing Weight: 75 kg  Vital Signs: Temp: 98.1 F (36.7 C) (04/10 1423) Temp Source: Oral (04/10 1423) BP: 107/46 (04/10 1423) Pulse Rate: 52 (04/10 1423)  Labs: Recent Labs    01/19/18 1122 01/19/18 1952 01/20/18 0206 01/20/18 0731 01/20/18 1523  HGB 14.3  --   --  13.8  --   HCT 43.9  --   --  41.4  --   PLT 205  --   --  189  --   APTT 33  --   --   --   --   LABPROT 12.6  --   --   --   --   INR 0.95  --   --   --   --   HEPARINUNFRC  --   --  <0.10* 0.10* 0.20*  CREATININE 0.71  --   --  0.72  --   TROPONINI  --  <0.03 <0.03 0.03*  --     Estimated Creatinine Clearance: 79 mL/min (by C-G formula based on SCr of 0.72 mg/dL).   Assessment: [age] YOF s/p cath to continue on IV heparin. Heparin level remains subtherapeutic at 0.2 after rate increase. CBC stable. No issue with heparin infusion; no bleeding/hematoma per RN.  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  Goal of Therapy:  Heparin level 0.3-0.7 units/ml Monitor platelets by anticoagulation protocol: Yes    Plan:  Increase heparin gtt to 1450 units/hr Check 6 hr heparin level Daily heparin level and CBC Monitor closely for s/sx of bleeding / hematoma   Babs BertinHaley Remee Charley, PharmD, BCPS Clinical Pharmacist 01/20/2018 4:29 PM

## 2018-01-20 NOTE — Progress Notes (Signed)
Tele tech called RN to report some PVC's and bradycardia. Tech also stated that patient was having intermittent small pauses with some of the PVC's. Patient was assessed (asymptomatic) and charge was notified- will continue to monitor and will contact on-call NP/MD if it continues.   Pauses were less than 2 seconds

## 2018-01-20 NOTE — Progress Notes (Signed)
ANTICOAGULATION CONSULT NOTE  Pharmacy Consult:  Heprin Indication: chest pain/ACS  No Known Allergies  Patient Measurements: Height: 5\' 5"  (165.1 cm) Weight: 215 lb 9.6 oz (97.8 kg)(weight difference i used standing weight scale nurse notifie) IBW/kg (Calculated) : 57 Heparin Dosing Weight: 75 kg  Vital Signs: Temp: 97.9 F (36.6 C) (04/10 0623) Temp Source: Oral (04/10 0623) BP: 114/68 (04/10 0623) Pulse Rate: 40 (04/10 0623)  Labs: Recent Labs    01/19/18 1122 01/19/18 1952 01/20/18 0206 01/20/18 0731  HGB 14.3  --   --  13.8  HCT 43.9  --   --  41.4  PLT 205  --   --  189  APTT 33  --   --   --   LABPROT 12.6  --   --   --   INR 0.95  --   --   --   HEPARINUNFRC  --   --  <0.10* 0.10*  CREATININE 0.71  --   --   --   TROPONINI  --  <0.03 <0.03  --     Estimated Creatinine Clearance: 79 mL/min (by C-G formula based on SCr of 0.71 mg/dL).   Assessment: [age] YOF s/p cath to continue on IV heparin.  Heparin level is sub-therapeutic but it was drawn 2 hours early.  No issue with heparin infusion; no bleeding/hematoma per RN.  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   Goal of Therapy:  Heparin level 0.3-0.7 units/ml Monitor platelets by anticoagulation protocol: Yes    Plan:  Increase heparin gtt to 1300 units/hr, no bolus post cath Check 6 hr heparin level Daily heparin level and CBC Monitor closely for s/sx of bleeding / hematoma   Leonardo Plaia D. Laney Potashang, PharmD, BCPS Pager:  [phone] 01/20/2018, 9:06 AM

## 2018-01-20 NOTE — Progress Notes (Addendum)
The patient has been seen in conjunction with Laverda Page, NP. All aspects of care have been considered and discussed. The patient has been personally interviewed, examined, and all clinical data has been reviewed.   No cardiac complaints this morning.  No access site problems.  Catheter results personally reviewed and are unchanged from most recent prior angiogram performed in 2014.  2 vascular concerns identified since admission: 1.  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  2.  Abdominal aortic aneurysm with iliac obstruction.  Kidney function post cardiac catheterization.  Eligible for discharge once treatment plan establish.  Medical therapy for CAD.   Progress Note  Patient Name: Heather Livingston Date of Encounter: 01/20/2018  Primary Cardiologist: No primary care provider on file.  Subjective   No chest pain this morning. Anxious about next steps.   Inpatient Medications    Scheduled Meds: . amLODipine  5 mg Oral Daily  . aspirin EC  81 mg Oral Daily  . clopidogrel  75 mg Oral Daily  . famotidine  20 mg Oral QHS  . hydrALAZINE  75 mg Oral TID  . hydrochlorothiazide  25 mg Oral Daily  . isosorbide mononitrate  60 mg Oral Daily  . losartan  100 mg Oral Daily  . metoprolol tartrate  100 mg Oral BID  . [START ON 01/21/2018] pneumococcal 23 valent vaccine  0.5 mL Intramuscular Tomorrow-1000  . simvastatin  40 mg Oral q1800  . sodium chloride flush  3 mL Intravenous Q12H   Continuous Infusions: . sodium chloride    . heparin 1,100 Units/hr (01/20/18 0322)   PRN Meds: sodium chloride, acetaminophen, albuterol, hydrALAZINE, HYDROcodone-acetaminophen, nitroGLYCERIN, ondansetron (ZOFRAN) IV, sodium chloride flush   Vital Signs    Vitals:   01/19/18 2110 01/19/18 2306 01/19/18 2317 01/20/18 0623  BP: (!) 161/68 (!) 141/68  114/68  Pulse: (!) 52   (!) 40  Resp: 20   12  Temp: 97.7 F (36.5 C)   97.9 F (36.6 C)  TempSrc: Oral    Oral  SpO2:   96% 96%  Weight:    215 lb 9.6 oz (97.8 kg)  Height:        Intake/Output Summary (Last 24 hours) at 01/20/2018 0950 Last data filed at 01/20/2018 0730 Gross per 24 hour  Intake 150 ml  Output 1150 ml  Net -1000 ml   Filed Weights   01/19/18 1056 01/20/18 0623  Weight: 187 lb (84.8 kg) 215 lb 9.6 oz (97.8 kg)    Telemetry    SR with PACs and PVCs, SB while at rest - Personally Reviewed  ECG    N/a - Personally Reviewed  Physical Exam   General: Well developed, well nourished, female appearing in no acute distress. Head: Normocephalic, atraumatic.  Neck: Supple without bruits, JVD. Lungs:  Resp regular and unlabored, CTA. Heart: RRR, S1, S2, no S3, S4, or murmur; no rub. Abdomen: Soft, non-tender, non-distended with normoactive bowel sounds.  Extremities: No clubbing, cyanosis, edema. Distal pedal pulses are 2+ bilaterally. Right femoral cath site stable.  Neuro: Alert and oriented X 3. Moves all extremities spontaneously. Psych: Normal affect.  Labs    Chemistry Recent Labs  Lab 01/19/18 1122 01/20/18 0731  NA 139 137  K 3.9 3.7  CL 104 105  CO2 26 22  GLUCOSE 95 95  BUN 18 15  CREATININE 0.71 0.72  CALCIUM 9.1 9.0  GFRNONAA >60 >60  GFRAA >60 >60  ANIONGAP 9 10  Hematology Recent Labs  Lab 01/19/18 1122 01/20/18 0731  WBC 5.2 4.8  RBC 4.63 4.36  HGB 14.3 13.8  HCT 43.9 41.4  MCV 94.8 95.0  MCH 30.9 31.7  MCHC 32.6 33.3  RDW 13.3 13.9  PLT 205 189    Cardiac Enzymes Recent Labs  Lab 01/19/18 1952 01/20/18 0206 01/20/18 0731  TROPONINI <0.03 <0.03 0.03*    Recent Labs  Lab 01/19/18 1133  TROPIPOC 0.03     BNPNo results for input(s): BNP, PROBNP in the last 168 hours.   DDimer No results for input(s): DDIMER in the last 168 hours.    Radiology    Ct Angio Head W Or Wo Contrast  Result Date: 01/19/2018 CLINICAL DATA:  Acute presentation with dizziness beginning today. 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  EXAM: CT ANGIOGRAPHY HEAD AND NECK TECHNIQUE: Multidetector CT imaging of the head and neck was performed using the standard protocol during bolus administration of intravenous contrast. Multiplanar CT image reconstructions and MIPs were obtained to evaluate the vascular anatomy. Carotid stenosis measurements (when applicable) are obtained utilizing NASCET criteria, using the distal internal carotid diameter as the denominator. CONTRAST:  75mL ISOVUE-370 IOPAMIDOL (ISOVUE-370) INJECTION 76% COMPARISON:  CT same day FINDINGS: CTA NECK FINDINGS Aortic arch: Pronounced aortic atherosclerosis. No aneurysm or dissection. Branching pattern of the brachiocephalic vessels from the arch is normal. No origin stenosis. Ectasia of the proximal subclavian artery. Right carotid system: Common carotid artery shows atherosclerotic plaque but is widely patent to the bifurcation. Soft and calcified plaque at the carotid bifurcation and ICA bulb. Minimal diameter of the proximal ICA is 5 mm, this same as the more distal cervical ICA. Therefore there is no stenosis. 70% stenosis of the proximal ECA. Marked tortuosity of the cervical ICA. In the region of maximal tortuosity, at the C3 level, there is irregularity of the ICA consistent with fibromuscular disease. There is a pseudo aneurysm in this location measuring 6 mm in diameter. Left carotid system: Common carotid artery shows some non stenotic plaque and is widely patent to the bifurcation region. Calcified plaque at the carotid bifurcation and ICA bulb. Minimal diameter in the ICA bulb is 4 mm. Compared to a more distal cervical ICA diameter of 5 mm, this indicates a 20% stenosis. 70% stenosis of the proximal ECA. Cervical internal carotid artery is markedly tortuous on this side as well. No pseudo aneurysm seen on this side. Beneath the skull base, the vessel is ectatic measuring up to 7 mm in diameter. Vertebral arteries: Right vertebral artery origin is widely patent. 30%  stenosis of the left vertebral artery origin. Both proximal vertebral arteries are markedly tortuous. Beyond that, the vertebral arteries show some scattered atherosclerotic plaque but are widely patent through the cervical region to the foramen magnum. Skeleton: Mid cervical spondylosis with prominent osteophytes at C5-6 and C6-7. Other neck: No mass or lymphadenopathy. Upper chest: Emphysema and pulmonary scarring.  No active process. Review of the MIP images confirms the above findings CTA HEAD FINDINGS Anterior circulation: Both internal carotid arteries are patent through the skull base and siphon regions. There is peripheral atherosclerotic calcification in the carotid siphon regions but no stenosis greater than 30%. Supraclinoid internal carotid arteries are widely patent. The anterior and middle cerebral vessels are widely patent. No stenosis, aneurysm or vascular malformation. Posterior circulation: Both vertebral arteries are patent at the foramen magnum level. There is atherosclerotic plaque of the V4 segment on the left with 30% stenosis. Both vertebral arteries  are patent to the basilar. No basilar stenosis. There is a basilar tip aneurysm measuring 9 mm in diameter with a wide mouth. The aneurysm projects slightly more towards the left. Diameter of the mouth is 4 mm. The aneurysm originates between the left superior cerebellar and left posterior cerebral arteries. Venous sinuses: Patent and normal. Anatomic variants: None significant Delayed phase: No abnormal enhancement. 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 Advanced aortic atherosclerosis. Atherosclerotic change at both carotid bifurcations. No stenosis of the right ICA. 20% stenosis of the left ICA. Both  cervical internal carotid arteries are markedly tortuous. There is probably bilateral fibromuscular change, more pronounced on the right. 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 Electronically Signed   By: Paulina Fusi M.D.   On: 01/19/2018 13:54   Ct Head Wo Contrast  Result Date: 01/19/2018 CLINICAL DATA:  Ataxia and dizziness.  Hypertension. EXAM: CT HEAD WITHOUT CONTRAST TECHNIQUE: Contiguous axial images were obtained from the base of the skull through the vertex without intravenous contrast. COMPARISON:  None. FINDINGS: Brain: The ventricles are normal in size and configuration. There is no intracranial mass, hemorrhage, extra-axial fluid collection, or midline shift. There is slight small vessel disease in the centra semiovale bilaterally. Elsewhere gray-white compartments appear normal. No evident acute infarct. Vascular: There is a 1.0 x 0.9 x 0.7 cm aneurysm arising at the distal basilar artery extending marginally toward the left. There is no hyperdense vessel. There is calcification in each carotid siphon region as well as in the distal left vertebral artery. Skull: The bony calvarium appears intact. Sinuses/Orbits: There is mucosal thickening in several ethmoid air cells. Other visualized paranasal sinuses are clear. Orbits appear symmetric bilaterally. Other: Mastoid air cells are clear. IMPRESSION: 1. 1.0 x 0.9 x 0.7 cm aneurysm at the distal aspect of the basilar artery extending slightly toward the left. Advise correlation with CT angiography to further assess. An aneurysm of this size has an increased risk of rupture. 2. Slight periventricular small vessel disease. No acute infarct or hemorrhage. No mass. No extra-axial fluid. 3.  Foci of arterial vascular calcification. 4.  Mucosal thickening in several ethmoid air cells. These results were called by telephone at the time of interpretation on 01/19/2018 at 12:58 pm to Dr. Derwood Kaplan , who verbally acknowledged  these results. Electronically Signed   By: Bretta Bang III M.D.   On: 01/19/2018 13:02   Ct Angio Neck W And/or Wo Contrast  Result Date: 01/19/2018 CLINICAL DATA:  Acute presentation with dizziness beginning today. 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 EXAM: CT ANGIOGRAPHY HEAD AND NECK TECHNIQUE: Multidetector CT imaging of the head and neck was performed using the standard protocol during bolus administration of intravenous contrast. Multiplanar CT image reconstructions and MIPs were obtained to evaluate the vascular anatomy. Carotid stenosis measurements (when applicable) are obtained utilizing NASCET criteria, using the distal internal carotid diameter as the denominator. CONTRAST:  75mL ISOVUE-370 IOPAMIDOL (ISOVUE-370) INJECTION 76% COMPARISON:  CT same day FINDINGS: CTA NECK FINDINGS Aortic arch: Pronounced aortic atherosclerosis. No aneurysm or dissection. Branching pattern of the brachiocephalic vessels from the arch is normal. No origin stenosis. Ectasia of the proximal subclavian artery. Right carotid system: Common carotid artery shows atherosclerotic  plaque but is widely patent to the bifurcation. Soft and calcified plaque at the carotid bifurcation and ICA bulb. Minimal diameter of the proximal ICA is 5 mm, this same as the more distal cervical ICA. Therefore there is no stenosis. 70% stenosis of the proximal ECA. Marked tortuosity of the cervical ICA. In the region of maximal tortuosity, at the C3 level, there is irregularity of the ICA consistent with fibromuscular disease. There is a pseudo aneurysm in this location measuring 6 mm in diameter. Left carotid system: Common carotid artery shows some non stenotic plaque and is widely patent to the bifurcation region. Calcified plaque at the carotid bifurcation and ICA bulb. Minimal diameter in the ICA bulb is 4 mm. Compared to a more distal cervical ICA diameter of 5 mm, this indicates a 20% stenosis. 70% stenosis of the proximal ECA.  Cervical internal carotid artery is markedly tortuous on this side as well. No pseudo aneurysm seen on this side. Beneath the skull base, the vessel is ectatic measuring up to 7 mm in diameter. Vertebral arteries: Right vertebral artery origin is widely patent. 30% stenosis of the left vertebral artery origin. Both proximal vertebral arteries are markedly tortuous. Beyond that, the vertebral arteries show some scattered atherosclerotic plaque but are widely patent through the cervical region to the foramen magnum. Skeleton: Mid cervical spondylosis with prominent osteophytes at C5-6 and C6-7. Other neck: No mass or lymphadenopathy. Upper chest: Emphysema and pulmonary scarring.  No active process. Review of the MIP images confirms the above findings CTA HEAD FINDINGS Anterior circulation: Both internal carotid arteries are patent through the skull base and siphon regions. There is peripheral atherosclerotic calcification in the carotid siphon regions but no stenosis greater than 30%. Supraclinoid internal carotid arteries are widely patent. The anterior and middle cerebral vessels are widely patent. No stenosis, aneurysm or vascular malformation. Posterior circulation: Both vertebral arteries are patent at the foramen magnum level. There is atherosclerotic plaque of the V4 segment on the left with 30% stenosis. Both vertebral arteries are patent to the basilar. No basilar stenosis. There is a basilar tip aneurysm measuring 9 mm in diameter with a wide mouth. The aneurysm projects slightly more towards the left. Diameter of the mouth is 4 mm. The aneurysm originates between the left superior cerebellar and left posterior cerebral arteries. Venous sinuses: Patent and normal. Anatomic variants: None significant Delayed phase: No abnormal enhancement. Review of the MIP images confirms the above findings IMPRESSION: The study confirms the presence of a distal basilar aneurysm measuring 9 mm in diameter with a 4 mm mouth,  projecting towards the left with the origin between the left superior cerebellar and posterior cerebral arteries. Advanced aortic atherosclerosis. Atherosclerotic change at both carotid bifurcations. No stenosis of the right ICA. 20% stenosis of the left ICA. Both cervical internal carotid arteries are markedly tortuous. There is probably bilateral fibromuscular change, more pronounced on the right. 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 Electronically Signed   By: Paulina FusiMark  Shogry M.D.   On: 01/19/2018 13:54   Dg Chest Portable 1 View  Result Date: 01/19/2018 CLINICAL DATA:  67 year old female with chest pain and bradycardia. Pain radiating to the left arm with numbness in the jaw. EXAM: PORTABLE CHEST 1 VIEW COMPARISON:  None available. FINDINGS: Portable AP upright view at 1148 hours. Cardiomegaly. Prior median sternotomy and CABG. Bilateral increased pulmonary interstitial markings suggesting vascular congestion. No pneumothorax, pleural effusion or consolidation. No acute osseous  abnormality identified. IMPRESSION: No prior study. Acute pulmonary interstitial edema suspected. Cardiomegaly and prior CABG. Electronically Signed   By: Odessa Fleming M.D.   On: 01/19/2018 12:04    Cardiac Studies   Cath: 01/19/18  Conclusion     Prox RCA lesion is 100% stenosed. SVG to PDA is patent.  Prox LAD lesion is 100% stenosed. LIMA to LAD is patent.  Mid LM to Dist LM lesion is 25% stenosed.  Mid Cx lesion is 50% stenosed. SVG to OM is occluded, known from prior.  The left ventricular systolic function is normal.  LV end diastolic pressure is mildly elevated.  The left ventricular ejection fraction is 50-55% by visual estimate.  There is no aortic valve stenosis.  Infrarenal AAA. PAD including right common iliac artery stenosis.  Bilateral common iliac arteries appear ectatic.   Medical therapy for CAD.  Stable from prior cath. No acute lesion detected.  She has a large  AAA.  There is a significant right common iliac lesion, with a 30 mm Hg gradient.  She will need a consult for her vascular disease.   Patient Profile     67 y.o. female with a history of hypertension, hyperlipidemia, OSA on CPAP, and multivessel CAD status post CABG as well as DES to the circumflex, known occlusion of the SVG to OM, patent LIMA to the LAD and SVG to the PDA as of 2014 who presented with weakness, unsteady gait and chest tightness. 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    Assessment & Plan    1. CAD s/p CABG: Cath yesterday noted patent LIMA-LAD, and SVG to PDA with occluded SVG to OM with 50% mLCX lesion noted. EF 50-55% on LV gram. Plan for medical therapy for CAD, stable from previous cath.   2. 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 [facility] has been consulted post cath yesterday. Dr. Myra Gianotti to follow up with patient today to determine testing moving forward. Renal function stable post cath.   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 Dr. Jordan Likes consulted. Noted imaging and reported lesion does not require treatment during this hospitalization and will plan to have her follow up as an outpatient with Dr. Conchita Paris.   4. Bradycardia: Mostly noted while at rest, but does drop rate into the 30s. Will reduce metoprolol to 50mg  BID. Follow blood pressure. May need to increase other agent.   Signed, Laverda Page, NP  01/20/2018, 9:50 AM  Pager # [phone]   For questions or updates, please contact [facility] Please consult [email or web address] for contact info under Cardiology/STEMI.

## 2018-01-20 NOTE — Progress Notes (Signed)
    Called neuro for consult regarding patient's symptoms. Recommended getting MRI. Will order and follow up with neuro regarding recommendations.   Janice CoffinSigned, Lindsay Roberts, NP-C 01/20/2018, 11:44 AM Pager: [phone]

## 2018-01-20 NOTE — Consult Note (Signed)
Patient has a normal creatinine post cath.  I am ordering a CTA chest, abdomen, Pelvis to further evaluate her AAA and ilica stenosis.  I will follow up with her once the CTA has been completed.  Heather Livingston

## 2018-01-21 ENCOUNTER — Inpatient Hospital Stay (HOSPITAL_COMMUNITY): Payer: Medicare Other

## 2018-01-21 DIAGNOSIS — I7143 Infrarenal abdominal aortic aneurysm, without rupture: Secondary | ICD-10-CM

## 2018-01-21 DIAGNOSIS — R410 Disorientation, unspecified: Secondary | ICD-10-CM

## 2018-01-21 DIAGNOSIS — I725 Aneurysm of other precerebral arteries: Secondary | ICD-10-CM

## 2018-01-21 DIAGNOSIS — I714 Abdominal aortic aneurysm, without rupture: Secondary | ICD-10-CM

## 2018-01-21 LAB — CBC
HEMATOCRIT: 41.3 % (ref 36.0–46.0)
Hemoglobin: 13.8 g/dL (ref 12.0–15.0)
MCH: 31.8 pg (ref 26.0–34.0)
MCHC: 33.4 g/dL (ref 30.0–36.0)
MCV: 95.2 fL (ref 78.0–100.0)
Platelets: 176 10*3/uL (ref 150–400)
RBC: 4.34 MIL/uL (ref 3.87–5.11)
RDW: 13.8 % (ref 11.5–15.5)
WBC: 4.6 10*3/uL (ref 4.0–10.5)

## 2018-01-21 LAB — HEPARIN LEVEL (UNFRACTIONATED)
HEPARIN UNFRACTIONATED: 0.28 [IU]/mL — AB (ref 0.30–0.70)
Heparin Unfractionated: 0.3 IU/mL (ref 0.30–0.70)

## 2018-01-21 MED ORDER — HEPARIN SODIUM (PORCINE) 5000 UNIT/ML IJ SOLN: 5000.0000 [IU] | Freq: Three times a day (TID) | INTRAMUSCULAR | Status: DC

## 2018-01-21 MED ORDER — HYDRALAZINE HCL 50 MG PO TABS
100.0000 mg | ORAL_TABLET | Freq: Three times a day (TID) | ORAL | Status: DC
  Administered 2018-01-21: 100 mg via ORAL
  Filled 2018-01-21: qty 2

## 2018-01-21 MED ORDER — AMLODIPINE BESYLATE 10 MG PO TABS: 10.0000 mg | ORAL_TABLET | Freq: Every day | ORAL | Status: DC

## 2018-01-21 MED ORDER — GADOBENATE DIMEGLUMINE 529 MG/ML IV SOLN
20.0000 mL | Freq: Once | INTRAVENOUS | Status: AC
  Administered 2018-01-21: 20 mL via INTRAVENOUS

## 2018-01-21 MED ORDER — METOPROLOL TARTRATE 25 MG PO TABS: 25.0000 mg | ORAL_TABLET | Freq: Two times a day (BID) | ORAL | 1 refills | Status: DC

## 2018-01-21 MED ORDER — HYDRALAZINE HCL 100 MG PO TABS: 100.0000 mg | ORAL_TABLET | Freq: Three times a day (TID) | ORAL | 1 refills | Status: DC

## 2018-01-21 MED ORDER — METOPROLOL TARTRATE 25 MG PO TABS
25.0000 mg | ORAL_TABLET | Freq: Two times a day (BID) | ORAL | Status: DC
  Administered 2018-01-21: 25 mg via ORAL

## 2018-01-21 NOTE — Progress Notes (Signed)
ANTICOAGULATION CONSULT NOTE  Pharmacy Consult for Heparin Indication: chest pain/ACS  No Known Allergies  Patient Measurements: Height: 5\' 5"  (165.1 cm) Weight: 215 lb 9.6 oz (97.8 kg)(weight difference i used standing weight scale nurse notifie) IBW/kg (Calculated) : 57 Heparin Dosing Weight: 75 kg  Vital Signs: Temp: 98.1 F (36.7 C) (04/10 1423) Temp Source: Oral (04/10 1423) BP: 148/87 (04/10 1742) Pulse Rate: 52 (04/10 1423)  Labs: Recent Labs    01/19/18 1122 01/19/18 1952  01/20/18 0206 01/20/18 0731 01/20/18 1523 01/20/18 2340  HGB 14.3  --   --   --  13.8  --   --   HCT 43.9  --   --   --  41.4  --   --   PLT 205  --   --   --  189  --   --   APTT 33  --   --   --   --   --   --   LABPROT 12.6  --   --   --   --   --   --   INR 0.95  --   --   --   --   --   --   HEPARINUNFRC  --   --    < > <0.10* 0.10* 0.20* 0.30  CREATININE 0.71  --   --   --  0.72  --   --   TROPONINI  --  <0.03  --  <0.03 0.03*  --   --    < > = values in this interval not displayed.    Estimated Creatinine Clearance: 79 mL/min (by C-G formula based on SCr of 0.72 mg/dL).   Assessment: [age] YOF s/p cath to continue on IV heparin. Heparin level therapeutic x 1 after rate increase.   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  Goal of Therapy:  Heparin level 0.3-0.7 units/ml Monitor platelets by anticoagulation protocol: Yes   Plan:  Cont heparin at 1450 units/hr Confirmatory heparin level with AM labs  Abran DukeJames Harold Mattes, PharmD, BCPS Clinical Pharmacist Phone: [phone]

## 2018-01-21 NOTE — Progress Notes (Addendum)
ANTICOAGULATION CONSULT NOTE  Pharmacy Consult:  Heprin Indication: chest pain/ACS  No Known Allergies  Patient Measurements: Height: 5\' 5"  (165.1 cm) Weight: 217 lb 11.2 oz (98.7 kg) IBW/kg (Calculated) : 57 Heparin Dosing Weight: 75 kg  Vital Signs: Temp: 98.5 F (36.9 C) (04/11 0647) Temp Source: Oral (04/11 0647) BP: 143/65 (04/11 0647) Pulse Rate: 59 (04/11 0647)  Labs: Recent Labs    01/19/18 1122 01/19/18 1952  01/20/18 0206 01/20/18 0731 01/20/18 1523 01/20/18 2340 01/21/18 0007 01/21/18 0418  HGB 14.3  --   --   --  13.8  --   --  13.8  --   HCT 43.9  --   --   --  41.4  --   --  41.3  --   PLT 205  --   --   --  189  --   --  176  --   APTT 33  --   --   --   --   --   --   --   --   LABPROT 12.6  --   --   --   --   --   --   --   --   INR 0.95  --   --   --   --   --   --   --   --   HEPARINUNFRC  --   --    < > <0.10* 0.10* 0.20* 0.30  --  0.28*  CREATININE 0.71  --   --   --  0.72  --   --   --   --   TROPONINI  --  <0.03  --  <0.03 0.03*  --   --   --   --    < > = values in this interval not displayed.    Estimated Creatinine Clearance: 79.4 mL/min (by C-G formula based on SCr of 0.72 mg/dL).   Assessment: Heather Livingston s/p cath to continue on IV heparin.  Heparin level is slightly sub-therapeutic this AM; no bleeding/hematoma reported.  RN reported that patient turned her heparin infusion off around 0600 because the pump was beeping (after morning lab).  RN resumed infusion around 0700.   Goal of Therapy:  Heparin level 0.3-0.7 units/ml Monitor platelets by anticoagulation protocol: Yes    Plan:  Increase heparin gtt to 1550 units/hr Daily heparin level and CBC Monitor closely for s/sx of bleeding / hematoma F/U CTA   Storm Sovine D. Laney Potashang, PharmD, BCPS Pager:  [phone] 01/21/2018, 7:02 AM

## 2018-01-21 NOTE — Discharge Summary (Addendum)
The patient has been seen in conjunction with Heather Page, NP. All aspects of care have been considered and discussed. The patient has been personally interviewed, examined, and all clinical data has been reviewed.   Requested formal neurology consultation which never occurred.  They recommended MRI which was performed and shows prior cerebellar infarction.  Neurology felt this was not acute and that she required no formal inpatient consultation despite her vague complaints which could include vertigo.  After speaking with the patient, discussing findings on all vascular and cerebral studies, she decided to go home rather than wait.  Cardiac status is stable.  Follow-up will be described as listed below.  Discharge Summary    Patient ID: Heather Livingston,  MRN: [id number], DOB/AGE: 1951/01/03 67 y.o.  Admit date: 01/19/2018 Discharge date: 01/21/2018  Primary Care Provider: Theodoro Livingston Primary Cardiologist: Heather Livingston   Discharge Diagnoses    Principal Problem:   Unstable angina [facility]) Active Problems:   SINUS BRADYCARDIA   Essential hypertension, benign   Aneurysm of infrarenal abdominal aorta (HCC)   Basilar artery aneurysm (HCC)   Allergies No Known Allergies  Diagnostic Studies/Procedures    Cath: 01/19/18  Conclusion     Prox RCA lesion is 100% stenosed. SVG to PDA is patent.  Prox LAD lesion is 100% stenosed. LIMA to LAD is patent.  Mid LM to Dist LM lesion is 25% stenosed.  Mid Cx lesion is 50% stenosed. SVG to OM is occluded, known from prior.  The left ventricular systolic function is normal.  LV end diastolic pressure is mildly elevated.  The left ventricular ejection fraction is 50-55% by visual estimate.  There is no aortic valve stenosis.  Infrarenal AAA. PAD including right common iliac artery stenosis.  Bilateral common iliac arteries appear ectatic.   Medical therapy for CAD.  Stable from prior cath. No acute lesion  detected.  She has a large AAA.  There is a significant right common iliac lesion, with a 30 mm Hg gradient.  She will need a consult for her vascular disease   _____________   History of Present Illness     67 y.o. 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  Today while she was shopping she began to experience a tightness in her neck/jaw radiating down into her left arm and ultimately her chest, became short of breath and also diaphoretic.  She has continued to feel unsteady on her feet as well, but has had no palpitations or syncope.  No headache, speech deficits or focal motor weakness.  She walked into our office in [place] with these symptoms, was assessed and sent to the ER at [facility] via EMS where I evaluated her.  He states that the symptoms are similar to previous angina.  She reports compliance with her typical medications.  Initial lab work shows troponin I 0.03.  ECG shows sinus bradycardia with predominantly inferolateral ST-T wave abnormalities that are nonspecific but concerning for ischemia, more pronounced in the inferior leads than on prior tracing.   She does have a history of bradycardia, but tolerates fairly high dose Lopressor at home.  Heart rate at last clinic visit was in the 60s, she is in  the 40s-50s in the ER.   Hospital Course     Consultants: VVS, Neurology, Neurosurgery    Underwent cardiac cath noted above with patent LIMA-LAD, and SVG to PDA with occluded SVG to OM with 50% mLCX lesion noted. EF 50-55% on LV gram. Plan for medical therapy for CAD, stable from previous cath. Also had abd angiogram noting an infrarenal AAA with right common iliac lesion. VVS consulted and CTA ordered noting 3.6cm  AAA, right CIA stenosis. Seen by Heather Livingston and noted to follow up for elective repair of AAA and CIA stenosis. Did have CT head/neck prior to discharge noting distal basilar artery aneurysm which was discussed with Heather Livingston who stated to refer to Heather Livingston as an outpatient. Did briefly consult neurology related to her weakness prior to admission and recommended MRI. 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 Discussed findings with neurology who felt this was nonacute and no interventions needed at this time. No further chest pain during admission. Follow up appts arranged prior to discharge. Did reduce back her metoprolol from 100mg  BID to 25mg  BID, and increased hydralazine from 75mg  TID to 100mg  TID.   Heather Livingston was seen by Heather Livingston and determined stable for discharge home. Follow up in the office has been arranged. Medications are listed below.   _____________  Discharge Vitals Blood pressure (!) 161/66, pulse (!) 44, temperature 98 F (36.7 C), temperature source Oral, resp. rate 17, height 5\' 5"  (1.651 m), weight 217 lb 11.2 oz (98.7 kg), SpO2 98 %.  Filed Weights   01/19/18 1056 01/20/18 0623 01/21/18 0647  Weight: 187 lb (84.8 kg) 215 lb 9.6 oz (97.8 kg) 217 lb 11.2 oz (98.7 kg)    Labs & Radiologic Studies    CBC Recent Labs    01/20/18 0731 01/21/18 0007  WBC 4.8 4.6  HGB 13.8 13.8  HCT 41.4 41.3  MCV 95.0 95.2  PLT 189 176   Basic Metabolic Panel Recent Labs    16/10/96 1122 01/20/18 0731  NA 139 137  K 3.9 3.7  CL 104 105  CO2 26 22  GLUCOSE 95 95  BUN 18 15  CREATININE 0.71 0.72  CALCIUM 9.1 9.0  MG 1.9  --    Liver Function Tests No results for input(s): AST, ALT, ALKPHOS, BILITOT, PROT, ALBUMIN in the last 72 hours. No results for input(s): LIPASE, AMYLASE in the last 72 hours. Cardiac Enzymes Recent Labs    01/19/18 1952 01/20/18 0206 01/20/18 0731  TROPONINI <0.03 <0.03 0.03*   BNP Invalid input(s): POCBNP D-Dimer No results  for input(s): DDIMER in the last 72 hours. Hemoglobin A1C No results for input(s): HGBA1C in the last 72 hours. Fasting Lipid Panel Recent Labs    01/20/18 0206  CHOL 85  HDL 25*  LDLCALC 28  TRIG 045*  CHOLHDL 3.4   Thyroid Function Tests No results for input(s): TSH, T4TOTAL, T3FREE, THYROIDAB in the last 72 hours.  Invalid input(s): FREET3 _____________  Ct Angio Head W Or Wo Contrast  Result Date: 01/19/2018 CLINICAL DATA:  Acute presentation with dizziness beginning today. 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 EXAM: CT ANGIOGRAPHY HEAD AND NECK TECHNIQUE: Multidetector CT imaging of the head and neck was performed using the standard protocol during bolus administration of intravenous contrast. Multiplanar CT image reconstructions and MIPs were obtained to evaluate the vascular anatomy. Carotid stenosis measurements (when applicable) are obtained utilizing NASCET criteria, using the distal internal carotid diameter as the denominator. CONTRAST:  75mL ISOVUE-370 IOPAMIDOL (ISOVUE-370) INJECTION 76% COMPARISON:  CT same day FINDINGS: CTA NECK FINDINGS Aortic arch: Pronounced aortic atherosclerosis. No aneurysm or dissection. Branching pattern of the brachiocephalic vessels from the arch is normal. No origin stenosis. Ectasia of the proximal subclavian artery. Right carotid system: Common carotid artery shows atherosclerotic plaque but is widely patent to the bifurcation. Soft and calcified plaque at the carotid bifurcation and ICA bulb. Minimal diameter of the proximal ICA is 5 mm, this same as the more distal cervical ICA. Therefore there is no stenosis. 70% stenosis of the proximal ECA. Marked tortuosity of the cervical ICA. In the region of maximal tortuosity, at the C3 level, there is irregularity of the ICA consistent with fibromuscular disease. There is a pseudo aneurysm in this location measuring 6 mm in diameter. Left carotid system: Common carotid artery shows some non stenotic  plaque and is widely patent to the bifurcation region. Calcified plaque at the carotid bifurcation and ICA bulb. Minimal diameter in the ICA bulb is 4 mm. Compared to a more distal cervical ICA diameter of 5 mm, this indicates a 20% stenosis. 70% stenosis of the proximal ECA. Cervical internal carotid artery is markedly tortuous on this side as well. No pseudo aneurysm seen on this side. Beneath the skull base, the vessel is ectatic measuring up to 7 mm in diameter. Vertebral arteries: Right vertebral artery origin is widely patent. 30% stenosis of the left vertebral artery origin. Both proximal vertebral arteries are markedly tortuous. Beyond that, the vertebral arteries show some scattered atherosclerotic plaque but are widely patent through the cervical region to the foramen magnum. Skeleton: Mid cervical spondylosis with prominent osteophytes at C5-6 and C6-7. Other neck: No mass or lymphadenopathy. Upper chest: Emphysema and pulmonary scarring.  No active process. Review of the MIP images confirms the above findings CTA HEAD FINDINGS Anterior circulation: Both internal carotid arteries are patent through the skull base and siphon regions. There is peripheral atherosclerotic calcification in the carotid siphon regions but no stenosis greater than 30%. Supraclinoid internal carotid arteries are widely patent. The anterior and middle cerebral vessels are widely patent. No stenosis, aneurysm or vascular malformation. Posterior circulation: Both vertebral arteries are patent at the foramen magnum level. There is atherosclerotic plaque of the V4 segment on the left with 30% stenosis. Both vertebral arteries are patent to the basilar. No basilar stenosis. There is a basilar tip aneurysm measuring 9 mm in diameter with a wide mouth. The aneurysm projects slightly more towards the left. Diameter of the mouth is 4 mm. The aneurysm originates between the left superior cerebellar and left posterior cerebral arteries.  Venous sinuses: Patent and normal. Anatomic variants: None significant Delayed phase: No abnormal enhancement. 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 Advanced aortic atherosclerosis. Atherosclerotic change at both carotid bifurcations. No stenosis of the right ICA. 20% stenosis of the left ICA. Both cervical internal carotid arteries are markedly tortuous. There is probably bilateral fibromuscular change, more pronounced on the right. 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 Electronically Signed   By: Paulina Fusi M.D.   On: 01/19/2018 13:54   Ct Head Wo Contrast  Result Date: 01/19/2018 CLINICAL DATA:  Ataxia and dizziness.  Hypertension. EXAM: CT  HEAD WITHOUT CONTRAST TECHNIQUE: Contiguous axial images were obtained from the base of the skull through the vertex without intravenous contrast. COMPARISON:  None. FINDINGS: Brain: The ventricles are normal in size and configuration. There is no intracranial mass, hemorrhage, extra-axial fluid collection, or midline shift. There is slight small vessel disease in the centra semiovale bilaterally. Elsewhere gray-white compartments appear normal. No evident acute infarct. Vascular: There is a 1.0 x 0.9 x 0.7 cm aneurysm arising at the distal basilar artery extending marginally toward the left. There is no hyperdense vessel. There is calcification in each carotid siphon region as well as in the distal left vertebral artery. Skull: The bony calvarium appears intact. Sinuses/Orbits: There is mucosal thickening in several ethmoid air cells. Other visualized paranasal sinuses are clear. Orbits appear symmetric bilaterally. Other: Mastoid air cells are clear. IMPRESSION: 1. 1.0 x 0.9 x 0.7 cm aneurysm at  the distal aspect of the basilar artery extending slightly toward the left. Advise correlation with CT angiography to further assess. An aneurysm of this size has an increased risk of rupture. 2. Slight periventricular small vessel disease. No acute infarct or hemorrhage. No mass. No extra-axial fluid. 3.  Foci of arterial vascular calcification. 4.  Mucosal thickening in several ethmoid air cells. These results were called by telephone at the time of interpretation on 01/19/2018 at 12:58 pm to Dr. Derwood Kaplan , who verbally acknowledged these results. Electronically Signed   By: Bretta Bang III M.D.   On: 01/19/2018 13:02   Ct Angio Neck W And/or Wo Contrast  Result Date: 01/19/2018 CLINICAL DATA:  Acute presentation with dizziness beginning today. 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 EXAM: CT ANGIOGRAPHY HEAD AND NECK TECHNIQUE: Multidetector CT imaging of the head and neck was performed using the standard protocol during bolus administration of intravenous contrast. Multiplanar CT image reconstructions and MIPs were obtained to evaluate the vascular anatomy. Carotid stenosis measurements (when applicable) are obtained utilizing NASCET criteria, using the distal internal carotid diameter as the denominator. CONTRAST:  75mL ISOVUE-370 IOPAMIDOL (ISOVUE-370) INJECTION 76% COMPARISON:  CT same day FINDINGS: CTA NECK FINDINGS Aortic arch: Pronounced aortic atherosclerosis. No aneurysm or dissection. Branching pattern of the brachiocephalic vessels from the arch is normal. No origin stenosis. Ectasia of the proximal subclavian artery. Right carotid system: Common carotid artery shows atherosclerotic plaque but is widely patent to the bifurcation. Soft and calcified plaque at the carotid bifurcation and ICA bulb. Minimal diameter of the proximal ICA is 5 mm, this same as the more distal cervical ICA. Therefore there is no stenosis. 70% stenosis of the proximal ECA. Marked tortuosity of the cervical ICA.  In the region of maximal tortuosity, at the C3 level, there is irregularity of the ICA consistent with fibromuscular disease. There is a pseudo aneurysm in this location measuring 6 mm in diameter. Left carotid system: Common carotid artery shows some non stenotic plaque and is widely patent to the bifurcation region. Calcified plaque at the carotid bifurcation and ICA bulb. Minimal diameter in the ICA bulb is 4 mm. Compared to a more distal cervical ICA diameter of 5 mm, this indicates a 20% stenosis. 70% stenosis of the proximal ECA. Cervical internal carotid artery is markedly tortuous on this side as well. No pseudo aneurysm seen on this side. Beneath the skull base, the vessel is ectatic measuring up to 7 mm in diameter. Vertebral arteries: Right vertebral artery origin is widely patent. 30% stenosis of the left vertebral artery origin. Both proximal vertebral arteries  are markedly tortuous. Beyond that, the vertebral arteries show some scattered atherosclerotic plaque but are widely patent through the cervical region to the foramen magnum. Skeleton: Mid cervical spondylosis with prominent osteophytes at C5-6 and C6-7. Other neck: No mass or lymphadenopathy. Upper chest: Emphysema and pulmonary scarring.  No active process. Review of the MIP images confirms the above findings CTA HEAD FINDINGS Anterior circulation: Both internal carotid arteries are patent through the skull base and siphon regions. There is peripheral atherosclerotic calcification in the carotid siphon regions but no stenosis greater than 30%. Supraclinoid internal carotid arteries are widely patent. The anterior and middle cerebral vessels are widely patent. No stenosis, aneurysm or vascular malformation. Posterior circulation: Both vertebral arteries are patent at the foramen magnum level. There is atherosclerotic plaque of the V4 segment on the left with 30% stenosis. Both vertebral arteries are patent to the basilar. No basilar stenosis.  There is a basilar tip aneurysm measuring 9 mm in diameter with a wide mouth. The aneurysm projects slightly more towards the left. Diameter of the mouth is 4 mm. The aneurysm originates between the left superior cerebellar and left posterior cerebral arteries. Venous sinuses: Patent and normal. Anatomic variants: None significant Delayed phase: No abnormal enhancement. 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 Advanced aortic atherosclerosis. Atherosclerotic change at both carotid bifurcations. No stenosis of the right ICA. 20% stenosis of the left ICA. Both cervical internal carotid arteries are markedly tortuous. There is probably bilateral fibromuscular change, more pronounced on the right. 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 Electronically Signed   By: Paulina Fusi M.D.   On: 01/19/2018 13:54   Mr Laqueta Jean JY Contrast  Result Date: 01/21/2018 CLINICAL DATA:  Generalized muscle weakness. Unsteady gait. History of hypertension, hyperlipidemia, aneurysm. EXAM: MRI HEAD WITHOUT AND WITH CONTRAST TECHNIQUE: Multiplanar, multiecho pulse sequences of the brain and surrounding structures were obtained without and with intravenous contrast. CONTRAST:  20mL MULTIHANCE GADOBENATE DIMEGLUMINE 529 MG/ML IV SOLN COMPARISON:  CT/CTA HEAD and neck January 20, 2011 FINDINGS: INTRACRANIAL CONTENTS: No reduced diffusion to suggest acute ischemia. No susceptibility artifact to suggest hemorrhage. The ventricles and sulci are normal for patient's age. Patchy supratentorial white matter FLAIR T2 hyperintensities. Old small RIGHT cerebellar infarcts. No suspicious parenchymal signal, masses, mass effect. No abnormal intraparenchymal or extra-axial enhancement.  No abnormal extra-axial fluid collections. No extra-axial masses. VASCULAR: Major intracranial vascular flow voids present at skull base. Bilobed 9 mm basilar tip aneurysm better characterized on prior imaging. SKULL AND UPPER CERVICAL SPINE: No abnormal sellar expansion. No suspicious calvarial bone marrow signal. Craniocervical junction maintained. SINUSES/ORBITS: The paranasal sinuses are well aerated. Trace mastoid effusions.The included ocular globes and orbital contents are non-suspicious. Status post bilateral ocular lens implants. OTHER: None. IMPRESSION: 1. No acute intracranial process. 2. 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 ischemic disease. 3. Redemonstration of basilar tip aneurysm better characterized on recent CTA HEAD. Neuro-Interventional Radiology consultation is suggested to evaluate the appropriateness of potential treatment. Non-emergent evaluation can be arranged by calling [phone] during usual hours. Emergency evaluation can be requested by paging [phone]. Electronically Signed   By: Awilda Metro M.D.   On: 01/21/2018 13:59   Dg Chest Portable 1  View  Result Date: 01/19/2018 CLINICAL DATA:  67 year old female with chest pain and bradycardia. Pain radiating to the left arm with numbness in the jaw. EXAM: PORTABLE CHEST 1 VIEW COMPARISON:  None available. FINDINGS: Portable AP upright view at 1148 hours. Cardiomegaly. Prior median sternotomy and CABG. Bilateral increased pulmonary interstitial markings suggesting vascular congestion. No pneumothorax, pleural effusion or consolidation. No acute osseous abnormality identified. IMPRESSION: No prior study. Acute pulmonary interstitial edema suspected. Cardiomegaly and prior CABG. Electronically Signed   By: Odessa Fleming M.D.   On: 01/19/2018 12:04   Ct Angio Chest/abd/pel For Dissection W And/or W/wo  Result Date: 01/21/2018 CLINICAL DATA:  67 year old female with abdominal aortic aneurysm and iliac artery  stenosis. EXAM: CT ANGIOGRAPHY CHEST, ABDOMEN AND PELVIS TECHNIQUE: Multidetector CT imaging through the chest, abdomen and pelvis was performed using the standard protocol during bolus administration of intravenous contrast. Multiplanar reconstructed images and MIPs were obtained and reviewed to evaluate the vascular anatomy. CONTRAST:  ISOVUE-370 IOPAMIDOL (ISOVUE-370) INJECTION 76% COMPARISON:  None. FINDINGS: CTA CHEST FINDINGS Cardiovascular: Conventional 3 vessel arch anatomy. Scattered heterogeneous and irregular atherosclerotic plaque throughout the transverse and descending thoracic aorta. The ascending and transverse thoracic aorta remain normal in size. The distal descending thoracic aorta is mildly aneurysmal at 4.0 cm in diameter just proximal to the aortic hiatus. The aortic root is normal in size. Calcifications are present throughout the coronary arteries including the left main, left anterior descending, circumflex and right coronary artery. Patient is status post median sternotomy with evidence of prior multivessel CABG. Mild cardiomegaly with right atrial dilatation. No pericardial effusion. Normal caliber pulmonary artery. No evidence of pulmonary embolus. Mediastinum/Nodes: Unremarkable CT appearance of the thyroid gland. No suspicious mediastinal or hilar adenopathy. No soft tissue mediastinal mass. The thoracic esophagus is unremarkable. Lungs/Pleura: Mild respiratory motion artifact. Dependent subsegmental atelectasis. Mild centrilobular pulmonary emphysema. No suspicious mass or nodule. Musculoskeletal: No acute fracture or aggressive appearing lytic or blastic osseous lesion. Review of the MIP images confirms the above findings. CTA ABDOMEN AND PELVIS FINDINGS VASCULAR Aorta: Heterogeneous atherosclerotic plaque extends throughout the abdominal aorta. Fusiform aneurysmal dilatation of the infrarenal abdominal aorta with a maximal diameter of 3.6 cm (please see coronal reformatted  images secondary to underlying tortuosity). No evidence of dissection. Celiac: Patent without evidence of aneurysm, dissection, vasculitis or significant stenosis. SMA: Patent without evidence of aneurysm, dissection, vasculitis or significant stenosis. Renals: There are 2 renal arteries bilaterally. The lowest renal artery is the second left renal artery. Heterogeneous atherosclerotic plaque at the origin of the renal arteries resulting in a focal moderate to high-grade stenosis of the origin of the more inferior right renal artery, mild stenosis of the origin of the more superior right renal artery. No definite stenosis at the origins of either left renal artery. IMA: Patent without evidence of aneurysm, dissection, vasculitis or significant stenosis. Inflow: Heterogeneous atherosclerotic plaque results in a focal high-grade stenosis of the right common iliac artery. The right internal and external iliac arteries are diseased but widely patent. The left common iliac artery demonstrates heterogeneous but largely calcified atherosclerotic plaque but no focal stenosis. Probable chronic occlusion of the left internal iliac artery. The left external iliac artery is relatively spared from disease. Veins: No focal venous abnormality.  Retroaortic left renal vein. Review of the MIP images confirms the above findings. NON-VASCULAR Hepatobiliary: Normal hepatic contour and morphology. No discrete hepatic lesions. Normal appearance of the gallbladder. No intra or extrahepatic biliary ductal dilatation. Pancreas: Unremarkable. No  pancreatic ductal dilatation or surrounding inflammatory changes. Spleen: Normal in size without focal abnormality. Adrenals/Urinary Tract: Mild adreniform thickening of the left adrenal gland likely reflecting adrenal hyperplasia. The right adrenal gland is normal. No enhancing renal mass, hydronephrosis or nephrolithiasis. Focal cortical scarring in the upper pole of the right kidney. Unremarkable  ureters and bladder. Stomach/Bowel: Scattered colonic diverticula without evidence of active diverticulitis. Normal appendix and terminal ileum. No focal bowel wall thickening or evidence of obstruction. Lymphatic: No suspicious lymphadenopathy. Reproductive: Status post hysterectomy. No adnexal masses. Other: No abdominal wall hernia or abnormality. No abdominopelvic ascites. Musculoskeletal: No acute or significant osseous findings. Focal L5-S1 degenerative disc disease. Review of the MIP images confirms the above findings. IMPRESSION: CTA CHEST 1. Mild aneurysmal dilatation of the distal descending thoracic aorta measuring up to 4.0 cm at the aortic hiatus. Aortic aneurysm NOS (ICD10-I71.9). 2.  Aortic Atherosclerosis (ICD10-170.0) 3. Coronary artery disease. 4.  Emphysema (ICD10-J43.9). 5. Cardiomegaly with right atrial dilatation. CTA ABD/PELVIS 1. 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 2. 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 Incidentally, the focal stenosis is within a region of focal vessel tortuosity. 3. 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 4. Focal mild stenosis of the more superior right renal artery. 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 6. Additional ancillary findings as above. Signed, Sterling BigHeath K. McCullough, MD [facility] Electronically Signed   By: Malachy MoanHeath  McCullough M.D.   On: 01/21/2018 08:37   Disposition   Pt is being discharged home today in good condition.  Follow-up Plans & Appointments    Follow-up Information    Lisbeth RenshawNundkumar, Neelesh, MD Follow up on 02/10/2018.   Specialty:  Neurosurgery Why:  11am for your follow up appt.  Contact information: [address] [place] [address] [phone]         Nada LibmanBrabham, Vance W, MD Follow up.   Specialties:  Vascular Surgery, Cardiology Why:  Please call the office if you do not recieve a call within the next couple of days for an appt. Contact information: [address] [place] [address] [phone]        Jonelle SidleMcDowell, Samuel G, MD Follow up.   Specialty:  Cardiology Why:  Office will call you with a follow up appt.  Contact information: [address] [place] [address] [phone]          Discharge Instructions    Call MD for:  redness, tenderness, or signs of infection (pain, swelling, redness, odor or green/yellow discharge around incision site)   Complete by:  As directed    Diet - low sodium heart healthy   Complete by:  As directed    Discharge instructions   Complete by:  As directed    Groin Site Care Refer to this sheet in the next few weeks. These instructions provide you with information on caring for yourself after your procedure. Your caregiver may also give you more specific instructions. Your treatment has been planned according to current medical practices, but problems sometimes occur. Call your caregiver if you have any problems or questions after your procedure. HOME CARE INSTRUCTIONS You may shower 24 hours after the procedure. Remove the bandage (dressing) and gently wash the site with plain soap and water. Gently pat the site dry.  Do not apply  powder or lotion to the site.  Do not sit in a bathtub, swimming pool, or whirlpool for 5 to 7 days.  No bending, squatting, or lifting anything over 10 pounds (4.5 kg) as directed by your caregiver.  Inspect the site at least twice daily.  Do not drive home if you are discharged the same day of the procedure. Have someone else drive you.  You may drive 24 hours after the procedure unless otherwise instructed by your caregiver.  What to expect: Any bruising will usually fade within 1 to 2 weeks.  Blood that collects in the tissue (hematoma) may be painful  to the touch. It should usually decrease in size and tenderness within 1 to 2 weeks.  SEEK IMMEDIATE MEDICAL CARE IF: You have unusual pain at the groin site or down the affected leg.  You have redness, warmth, swelling, or pain at the groin site.  You have drainage (other than a small amount of blood on the dressing).  You have chills.  You have a fever or persistent symptoms for more than 72 hours.  You have a fever and your symptoms suddenly get worse.  Your leg becomes pale, cool, tingly, or numb.  You have heavy bleeding from the site. Hold pressure on the site. .   Increase activity slowly   Complete by:  As directed       Discharge Medications     Medication List    STOP taking these medications   ibuprofen 800 MG tablet Commonly known as:  ADVIL,MOTRIN     TAKE these medications   amLODipine 5 MG tablet Commonly known as:  NORVASC Take 1 tablet (5 mg total) by mouth daily.   aspirin EC 81 MG tablet Take 1 tablet (81 mg total) by mouth daily.   clopidogrel 75 MG tablet Commonly known as:  PLAVIX Take 1 tablet (75 mg total) by mouth daily.   fluticasone 50 MCG/ACT nasal spray Commonly known as:  FLONASE Place 2 sprays into both nostrils daily.   hydrALAZINE 100 MG tablet Commonly known as:  APRESOLINE Take 1 tablet (100 mg total) by mouth 3 (three) times daily. What changed:    medication strength  how much to take   hydrochlorothiazide 25 MG tablet Commonly known as:  HYDRODIURIL TAKE 1 TABLET BY MOUTH EVERY DAY   HYDROcodone-acetaminophen 7.5-325 MG tablet Commonly known as:  NORCO Take 1 tablet by mouth every 6 (six) hours as needed for moderate pain.   isosorbide mononitrate 60 MG 24 hr tablet Commonly known as:  IMDUR Take 1 tablet (60 mg total) by mouth daily.   losartan 100 MG tablet Commonly known as:  COZAAR Take 1 tablet (100 mg total) by mouth daily.   magnesium oxide 400 (241.3 Mg) MG tablet Commonly known as:  MAG-OX Take 1 tablet  by mouth daily.   metoprolol tartrate 25 MG tablet Commonly known as:  LOPRESSOR Take 1 tablet (25 mg total) by mouth 2 (two) times daily. What changed:    medication strength  how much to take   nitroGLYCERIN 0.4 MG SL tablet Commonly known as:  NITROSTAT Place 1 tablet (0.4 mg total) under the tongue every 5 (five) minutes x 3 doses as needed for chest pain.   ranitidine 300 MG tablet Commonly known as:  ZANTAC Take 300 mg by mouth at bedtime.   simvastatin 40 MG tablet Commonly known as:  ZOCOR TAKE 1 TABLET BY MOUTH EVERYDAY AT BEDTIME   VENTOLIN HFA 108 (90  Base) MCG/ACT inhaler Generic drug:  albuterol Inhale 1 puff into the lungs as needed.        Outstanding Labs/Studies   N/a   Duration of Discharge Encounter   Greater than 30 minutes including physician time.  Signed, Heather Page NP-C 01/21/2018, 4:19 PM

## 2018-01-21 NOTE — Progress Notes (Addendum)
The patient has been seen in conjunction with Laverda Page. NP. All aspects of care have been considered and discussed. The patient has been personally interviewed, examined, and all clinical data has been reviewed.   Appreciate the recommendations from Dr. Myra Gianotti.  Convert heparin subcu.  Ambulate.  Vague complaints, possible vertigo, and disequilibrium.  Awaiting neurology evaluation.  Progress Note  Patient Name: Heather Livingston Date of Encounter: 01/21/2018  Primary Cardiologist: No primary care provider on file.  Subjective   No chest pain. Still feels like her cognition is off, and trouble with focusing.   Inpatient Medications    Scheduled Meds: . [START ON 01/22/2018] amLODipine  10 mg Oral Daily  . aspirin EC  81 mg Oral Daily  . atorvastatin  20 mg Oral q1800  . clopidogrel  75 mg Oral Daily  . famotidine  20 mg Oral QHS  . hydrALAZINE  100 mg Oral TID  . hydrochlorothiazide  25 mg Oral Daily  . iopamidol      . isosorbide mononitrate  60 mg Oral Daily  . losartan  100 mg Oral Daily  . metoprolol tartrate  25 mg Oral BID  . pneumococcal 23 valent vaccine  0.5 mL Intramuscular Tomorrow-1000  . sodium chloride flush  3 mL Intravenous Q12H   Continuous Infusions: . sodium chloride    . heparin 1,550 Units/hr (01/21/18 0743)   PRN Meds: sodium chloride, acetaminophen, albuterol, hydrALAZINE, HYDROcodone-acetaminophen, nitroGLYCERIN, ondansetron (ZOFRAN) IV, sodium chloride flush   Vital Signs    Vitals:   01/20/18 1126 01/20/18 1423 01/20/18 1742 01/21/18 0647  BP:  (!) 107/46 (!) 148/87 (!) 143/65  Pulse: (!) 56 (!) 52  (!) 59  Resp:  17    Temp:  98.1 F (36.7 C)  98.5 F (36.9 C)  TempSrc:  Oral  Oral  SpO2:  96%  99%  Weight:    217 lb 11.2 oz (98.7 kg)  Height:        Intake/Output Summary (Last 24 hours) at 01/21/2018 0927 Last data filed at 01/21/2018 1191 Gross per 24 hour  Intake 1579.92 ml  Output 500 ml  Net 1079.92 ml    Filed Weights   01/19/18 1056 01/20/18 0623 01/21/18 0647  Weight: 187 lb (84.8 kg) 215 lb 9.6 oz (97.8 kg) 217 lb 11.2 oz (98.7 kg)    Telemetry    SB with PVCs - Personally Reviewed  ECG    N/a - Personally Reviewed  Physical Exam   General: Well developed, well nourished, female appearing in no acute distress. Head: Normocephalic, atraumatic.  Neck: Supple without bruits, JVD. Lungs:  Resp regular and unlabored, CTA. Heart: RRR, S1, S2, no S3, S4, soft systolic murmur; no rub. Abdomen: Soft, non-tender, non-distended with normoactive bowel sounds.  Extremities: No clubbing, cyanosis, edema. Distal pedal pulses are 2+ bilaterally. Neuro: Alert and oriented X 3. Moves all extremities spontaneously. Psych: Normal affect.  Labs    Chemistry Recent Labs  Lab 01/19/18 1122 01/20/18 0731  NA 139 137  K 3.9 3.7  CL 104 105  CO2 26 22  GLUCOSE 95 95  BUN 18 15  CREATININE 0.71 0.72  CALCIUM 9.1 9.0  GFRNONAA >60 >60  GFRAA >60 >60  ANIONGAP 9 10     Hematology Recent Labs  Lab 01/19/18 1122 01/20/18 0731 01/21/18 0007  WBC 5.2 4.8 4.6  RBC 4.63 4.36 4.34  HGB 14.3 13.8 13.8  HCT 43.9 41.4 41.3  MCV 94.8 95.0 95.2  MCH 30.9 31.7 31.8  MCHC 32.6 33.3 33.4  RDW 13.3 13.9 13.8  PLT 205 189 176    Cardiac Enzymes Recent Labs  Lab 01/19/18 1952 01/20/18 0206 01/20/18 0731  TROPONINI <0.03 <0.03 0.03*    Recent Labs  Lab 01/19/18 1133  TROPIPOC 0.03     BNPNo results for input(s): BNP, PROBNP in the last 168 hours.   DDimer No results for input(s): DDIMER in the last 168 hours.    Radiology    Ct Angio Head W Or Wo Contrast  Result Date: 01/19/2018 CLINICAL DATA:  Acute presentation with dizziness beginning today. 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 EXAM: CT ANGIOGRAPHY HEAD AND NECK TECHNIQUE: Multidetector CT imaging of the head and neck was performed using the standard protocol during bolus administration of intravenous contrast.  Multiplanar CT image reconstructions and MIPs were obtained to evaluate the vascular anatomy. Carotid stenosis measurements (when applicable) are obtained utilizing NASCET criteria, using the distal internal carotid diameter as the denominator. CONTRAST:  75mL ISOVUE-370 IOPAMIDOL (ISOVUE-370) INJECTION 76% COMPARISON:  CT same day FINDINGS: CTA NECK FINDINGS Aortic arch: Pronounced aortic atherosclerosis. No aneurysm or dissection. Branching pattern of the brachiocephalic vessels from the arch is normal. No origin stenosis. Ectasia of the proximal subclavian artery. Right carotid system: Common carotid artery shows atherosclerotic plaque but is widely patent to the bifurcation. Soft and calcified plaque at the carotid bifurcation and ICA bulb. Minimal diameter of the proximal ICA is 5 mm, this same as the more distal cervical ICA. Therefore there is no stenosis. 70% stenosis of the proximal ECA. Marked tortuosity of the cervical ICA. In the region of maximal tortuosity, at the C3 level, there is irregularity of the ICA consistent with fibromuscular disease. There is a pseudo aneurysm in this location measuring 6 mm in diameter. Left carotid system: Common carotid artery shows some non stenotic plaque and is widely patent to the bifurcation region. Calcified plaque at the carotid bifurcation and ICA bulb. Minimal diameter in the ICA bulb is 4 mm. Compared to a more distal cervical ICA diameter of 5 mm, this indicates a 20% stenosis. 70% stenosis of the proximal ECA. Cervical internal carotid artery is markedly tortuous on this side as well. No pseudo aneurysm seen on this side. Beneath the skull base, the vessel is ectatic measuring up to 7 mm in diameter. Vertebral arteries: Right vertebral artery origin is widely patent. 30% stenosis of the left vertebral artery origin. Both proximal vertebral arteries are markedly tortuous. Beyond that, the vertebral arteries show some scattered atherosclerotic plaque but are  widely patent through the cervical region to the foramen magnum. Skeleton: Mid cervical spondylosis with prominent osteophytes at C5-6 and C6-7. Other neck: No mass or lymphadenopathy. Upper chest: Emphysema and pulmonary scarring.  No active process. Review of the MIP images confirms the above findings CTA HEAD FINDINGS Anterior circulation: Both internal carotid arteries are patent through the skull base and siphon regions. There is peripheral atherosclerotic calcification in the carotid siphon regions but no stenosis greater than 30%. Supraclinoid internal carotid arteries are widely patent. The anterior and middle cerebral vessels are widely patent. No stenosis, aneurysm or vascular malformation. Posterior circulation: Both vertebral arteries are patent at the foramen magnum level. There is atherosclerotic plaque of the V4 segment on the left with 30% stenosis. Both vertebral arteries are patent to the basilar. No basilar stenosis. There is a basilar tip aneurysm measuring 9 mm in diameter with a wide mouth. The aneurysm projects  slightly more towards the left. Diameter of the mouth is 4 mm. The aneurysm originates between the left superior cerebellar and left posterior cerebral arteries. Venous sinuses: Patent and normal. Anatomic variants: None significant Delayed phase: No abnormal enhancement. 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 Advanced aortic atherosclerosis. Atherosclerotic change at both carotid bifurcations. No stenosis of the right ICA. 20% stenosis of the left ICA. Both cervical internal carotid arteries are markedly tortuous. There is probably bilateral fibromuscular change, more pronounced on the right. 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 Electronically Signed   By: Paulina Fusi M.D.   On: 01/19/2018 13:54   Ct Head Wo Contrast  Result Date: 01/19/2018 CLINICAL DATA:  Ataxia and dizziness.  Hypertension. EXAM: CT HEAD WITHOUT CONTRAST TECHNIQUE: Contiguous axial images were obtained from the base of the skull through the vertex without intravenous contrast. COMPARISON:  None. FINDINGS: Brain: The ventricles are normal in size and configuration. There is no intracranial mass, hemorrhage, extra-axial fluid collection, or midline shift. There is slight small vessel disease in the centra semiovale bilaterally. Elsewhere gray-white compartments appear normal. No evident acute infarct. Vascular: There is a 1.0 x 0.9 x 0.7 cm aneurysm arising at the distal basilar artery extending marginally toward the left. There is no hyperdense vessel. There is calcification in each carotid siphon region as well as in the distal left vertebral artery. Skull: The bony calvarium appears intact. Sinuses/Orbits: There is mucosal thickening in several ethmoid air cells. Other visualized paranasal sinuses are clear. Orbits appear symmetric bilaterally. Other: Mastoid air cells are clear. IMPRESSION: 1. 1.0 x 0.9 x 0.7 cm aneurysm at the distal aspect of the basilar artery extending slightly toward the left. Advise correlation with CT angiography to further assess. An aneurysm of this size has an increased risk of rupture. 2. Slight periventricular small vessel disease. No acute infarct or hemorrhage. No mass. No extra-axial fluid. 3.  Foci of arterial vascular calcification. 4.  Mucosal thickening in several ethmoid air cells. These results were called by telephone at the time of interpretation on 01/19/2018 at 12:58 pm to Dr. Derwood Kaplan , who verbally acknowledged these results. Electronically Signed   By: Bretta Bang III M.D.   On: 01/19/2018 13:02   Ct Angio Neck W And/or Wo Contrast  Result Date: 01/19/2018 CLINICAL DATA:  Acute presentation  with dizziness beginning today. 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 EXAM: CT ANGIOGRAPHY HEAD AND NECK TECHNIQUE: Multidetector CT imaging of the head and neck was performed using the standard protocol during bolus administration of intravenous contrast. Multiplanar CT image reconstructions and MIPs were obtained to evaluate the vascular anatomy. Carotid stenosis measurements (when applicable) are obtained utilizing NASCET criteria, using the distal internal carotid diameter as the denominator. CONTRAST:  75mL ISOVUE-370 IOPAMIDOL (ISOVUE-370) INJECTION 76% COMPARISON:  CT same day FINDINGS: CTA NECK FINDINGS Aortic arch: Pronounced aortic atherosclerosis. No aneurysm or dissection. Branching pattern of the brachiocephalic vessels from the arch is normal. No origin stenosis. Ectasia of the proximal subclavian artery. Right carotid system: Common carotid artery shows atherosclerotic plaque but is widely patent to the bifurcation. Soft and calcified plaque at the carotid bifurcation and ICA bulb. Minimal diameter of the proximal ICA is  5 mm, this same as the more distal cervical ICA. Therefore there is no stenosis. 70% stenosis of the proximal ECA. Marked tortuosity of the cervical ICA. In the region of maximal tortuosity, at the C3 level, there is irregularity of the ICA consistent with fibromuscular disease. There is a pseudo aneurysm in this location measuring 6 mm in diameter. Left carotid system: Common carotid artery shows some non stenotic plaque and is widely patent to the bifurcation region. Calcified plaque at the carotid bifurcation and ICA bulb. Minimal diameter in the ICA bulb is 4 mm. Compared to a more distal cervical ICA diameter of 5 mm, this indicates a 20% stenosis. 70% stenosis of the proximal ECA. Cervical internal carotid artery is markedly tortuous on this side as well. No pseudo aneurysm seen on this side. Beneath the skull base, the vessel is ectatic measuring up to 7 mm in diameter.  Vertebral arteries: Right vertebral artery origin is widely patent. 30% stenosis of the left vertebral artery origin. Both proximal vertebral arteries are markedly tortuous. Beyond that, the vertebral arteries show some scattered atherosclerotic plaque but are widely patent through the cervical region to the foramen magnum. Skeleton: Mid cervical spondylosis with prominent osteophytes at C5-6 and C6-7. Other neck: No mass or lymphadenopathy. Upper chest: Emphysema and pulmonary scarring.  No active process. Review of the MIP images confirms the above findings CTA HEAD FINDINGS Anterior circulation: Both internal carotid arteries are patent through the skull base and siphon regions. There is peripheral atherosclerotic calcification in the carotid siphon regions but no stenosis greater than 30%. Supraclinoid internal carotid arteries are widely patent. The anterior and middle cerebral vessels are widely patent. No stenosis, aneurysm or vascular malformation. Posterior circulation: Both vertebral arteries are patent at the foramen magnum level. There is atherosclerotic plaque of the V4 segment on the left with 30% stenosis. Both vertebral arteries are patent to the basilar. No basilar stenosis. There is a basilar tip aneurysm measuring 9 mm in diameter with a wide mouth. The aneurysm projects slightly more towards the left. Diameter of the mouth is 4 mm. The aneurysm originates between the left superior cerebellar and left posterior cerebral arteries. Venous sinuses: Patent and normal. Anatomic variants: None significant Delayed phase: No abnormal enhancement. 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 Advanced aortic atherosclerosis. Atherosclerotic change at both carotid  bifurcations. No stenosis of the right ICA. 20% stenosis of the left ICA. Both cervical internal carotid arteries are markedly tortuous. There is probably bilateral fibromuscular change, more pronounced on the right. 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 Electronically Signed   By: Paulina Fusi M.D.   On: 01/19/2018 13:54   Dg Chest Portable 1 View  Result Date: 01/19/2018 CLINICAL DATA:  67 year old female with chest pain and bradycardia. Pain radiating to the left arm with numbness in the jaw. EXAM: PORTABLE CHEST 1 VIEW COMPARISON:  None available. FINDINGS: Portable AP upright view at 1148 hours. Cardiomegaly. Prior median sternotomy and CABG. Bilateral increased pulmonary interstitial markings suggesting vascular congestion. No pneumothorax, pleural effusion or consolidation. No acute osseous abnormality identified. IMPRESSION: No prior study. Acute pulmonary interstitial edema suspected. Cardiomegaly and prior CABG. Electronically Signed   By: Althea Grimmer.D.  On: 01/19/2018 12:04   Ct Angio Chest/abd/pel For Dissection W And/or W/wo  Result Date: 01/21/2018 CLINICAL DATA:  67 year old female with abdominal aortic aneurysm and iliac artery stenosis. EXAM: CT ANGIOGRAPHY CHEST, ABDOMEN AND PELVIS TECHNIQUE: Multidetector CT imaging through the chest, abdomen and pelvis was performed using the standard protocol during bolus administration of intravenous contrast. Multiplanar reconstructed images and MIPs were obtained and reviewed to evaluate the vascular anatomy. CONTRAST:  ISOVUE-370 IOPAMIDOL (ISOVUE-370) INJECTION 76% COMPARISON:  None. FINDINGS: CTA CHEST FINDINGS Cardiovascular: Conventional 3 vessel arch anatomy. Scattered heterogeneous and irregular atherosclerotic plaque throughout the transverse and descending thoracic aorta. The ascending and transverse thoracic aorta remain normal in size. The distal descending thoracic aorta is mildly aneurysmal at 4.0 cm in  diameter just proximal to the aortic hiatus. The aortic root is normal in size. Calcifications are present throughout the coronary arteries including the left main, left anterior descending, circumflex and right coronary artery. Patient is status post median sternotomy with evidence of prior multivessel CABG. Mild cardiomegaly with right atrial dilatation. No pericardial effusion. Normal caliber pulmonary artery. No evidence of pulmonary embolus. Mediastinum/Nodes: Unremarkable CT appearance of the thyroid gland. No suspicious mediastinal or hilar adenopathy. No soft tissue mediastinal mass. The thoracic esophagus is unremarkable. Lungs/Pleura: Mild respiratory motion artifact. Dependent subsegmental atelectasis. Mild centrilobular pulmonary emphysema. No suspicious mass or nodule. Musculoskeletal: No acute fracture or aggressive appearing lytic or blastic osseous lesion. Review of the MIP images confirms the above findings. CTA ABDOMEN AND PELVIS FINDINGS VASCULAR Aorta: Heterogeneous atherosclerotic plaque extends throughout the abdominal aorta. Fusiform aneurysmal dilatation of the infrarenal abdominal aorta with a maximal diameter of 3.6 cm (please see coronal reformatted images secondary to underlying tortuosity). No evidence of dissection. Celiac: Patent without evidence of aneurysm, dissection, vasculitis or significant stenosis. SMA: Patent without evidence of aneurysm, dissection, vasculitis or significant stenosis. Renals: There are 2 renal arteries bilaterally. The lowest renal artery is the second left renal artery. Heterogeneous atherosclerotic plaque at the origin of the renal arteries resulting in a focal moderate to high-grade stenosis of the origin of the more inferior right renal artery, mild stenosis of the origin of the more superior right renal artery. No definite stenosis at the origins of either left renal artery. IMA: Patent without evidence of aneurysm, dissection, vasculitis or significant  stenosis. Inflow: Heterogeneous atherosclerotic plaque results in a focal high-grade stenosis of the right common iliac artery. The right internal and external iliac arteries are diseased but widely patent. The left common iliac artery demonstrates heterogeneous but largely calcified atherosclerotic plaque but no focal stenosis. Probable chronic occlusion of the left internal iliac artery. The left external iliac artery is relatively spared from disease. Veins: No focal venous abnormality.  Retroaortic left renal vein. Review of the MIP images confirms the above findings. NON-VASCULAR Hepatobiliary: Normal hepatic contour and morphology. No discrete hepatic lesions. Normal appearance of the gallbladder. No intra or extrahepatic biliary ductal dilatation. Pancreas: Unremarkable. No pancreatic ductal dilatation or surrounding inflammatory changes. Spleen: Normal in size without focal abnormality. Adrenals/Urinary Tract: Mild adreniform thickening of the left adrenal gland likely reflecting adrenal hyperplasia. The right adrenal gland is normal. No enhancing renal mass, hydronephrosis or nephrolithiasis. Focal cortical scarring in the upper pole of the right kidney. Unremarkable ureters and bladder. Stomach/Bowel: Scattered colonic diverticula without evidence of active diverticulitis. Normal appendix and terminal ileum. No focal bowel wall thickening or evidence of obstruction. Lymphatic: No suspicious lymphadenopathy. Reproductive: Status post hysterectomy. No adnexal masses. Other: No  abdominal wall hernia or abnormality. No abdominopelvic ascites. Musculoskeletal: No acute or significant osseous findings. Focal L5-S1 degenerative disc disease. Review of the MIP images confirms the above findings. IMPRESSION: CTA CHEST 1. Mild aneurysmal dilatation of the distal descending thoracic aorta measuring up to 4.0 cm at the aortic hiatus. Aortic aneurysm NOS (ICD10-I71.9). 2.  Aortic Atherosclerosis (ICD10-170.0) 3.  Coronary artery disease. 4.  Emphysema (ICD10-J43.9). 5. Cardiomegaly with right atrial dilatation. CTA ABD/PELVIS 1. 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 2. 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 Incidentally, the focal stenosis is within a region of focal vessel tortuosity. 3. 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 4. Focal mild stenosis of the more superior right renal artery. 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 6. Additional ancillary findings as above. Signed, Sterling Big, MD [facility] Radiology Electronically Signed   By: Malachy Moan M.D.   On: 01/21/2018 08:37    Cardiac Studies   Cath: 01/19/18  Conclusion     Prox RCA lesion is 100% stenosed. SVG to PDA is patent.  Prox LAD lesion is 100% stenosed. LIMA to LAD is patent.  Mid LM to Dist LM lesion is 25% stenosed.  Mid Cx lesion is 50% stenosed. SVG to OM is occluded, known from prior.  The left ventricular systolic function is normal.  LV end diastolic pressure is mildly elevated.  The left ventricular ejection fraction is 50-55% by visual estimate.  There is no aortic valve stenosis.  Infrarenal AAA. PAD including right common iliac artery stenosis.  Bilateral common iliac arteries appear ectatic.  Medical therapy for CAD. Stable from prior cath. No acute lesion detected.  She has a large AAA. There is a significant right common iliac lesion, with a 30 mm Hg gradient. She will need a consult for her vascular disease.    Patient Profile     67 y.o. female with a history of hypertension, hyperlipidemia, OSA on CPAP, and multivessel CAD status post CABG as well as DES to the circumflex, known occlusion of the SVG to OM, patent LIMA to the  LAD and SVG to the PDA as of 2014 who presented with weakness, unsteady gait and chest tightness. 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    Assessment & Plan    1. CAD s/p CABG: Cath yesterday noted patent LIMA-LAD, and SVG to PDA with occluded SVG to OM with 50% mLCX lesion noted. EF 50-55% on LV gram. Plan for medical therapy for CAD, stable from previous cath.   2. 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 [facility] has been consulted post cath yesterday. Dr. Myra Gianotti to follow up with patient to determine plan. CTA completed last evening.  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 Dr. Jordan Likes consulted. Noted imaging and reported lesion does not require treatment during this hospitalization and will plan to have her follow up as an outpatient with Dr. Conchita Paris. She continues to report "fuzzy" feeling when it comes to her cognition. Trouble with her thoughts. Called neuro yesterday who required she have MRI, and call back after this is completed. Planned for MRI today.   4. Bradycardia: Mostly noted while at rest, but does drop rate into the 30s. Will reduce metoprolol to  25mg  BID.   5. HTN: Will increase amlodipine to 10mg , and hydralazine to 100mg  TID. Follow blood pressure.    Signed, Laverda PageLindsay Roberts, NP  01/21/2018, 9:27 AM  Pager # [phone]   For questions or updates, please contact [facility] Please consult [email or web address] for contact info under Cardiology/STEMI.

## 2018-01-21 NOTE — Progress Notes (Signed)
Discharge order obtained.  IV removed intact, telemetry monitor removed.  Reviewed AVS with patient/family, including medications, activity/restrictions, follow-up appointments.  Patient and family verbalized understanding.  Questions asked and answered.  Belongings given to family/patient.  Copy of AVS signature form placed in chart.   

## 2018-01-21 NOTE — Consult Note (Signed)
Full note to follow   CT shows 3.6 cm AAA, right CIA stenosis, and 4 cm TAAA. Patient having right hip pain, lilkely vascular in origin, but likely also secondary to arthritis. Will consider elective repair of AAA and CIA stenosis. Unclear why she is still on heparin.  Will need to be cleared from cardiology standpoint for general anesthesia.   Wyonia HoughWells BRaham

## 2018-01-22 ENCOUNTER — Encounter: Payer: Self-pay | Admitting: Surgery

## 2018-01-23 ENCOUNTER — Observation Stay (HOSPITAL_COMMUNITY)
Admission: EM | Admit: 2018-01-23 | Discharge: 2018-01-24 | Disposition: A | Discharge status: Home / Self Care | Payer: Medicare Other | Attending: Internal Medicine | Admitting: Internal Medicine

## 2018-01-23 ENCOUNTER — Other Ambulatory Visit: Payer: Self-pay | Admitting: Physician Assistant

## 2018-01-23 ENCOUNTER — Emergency Department (HOSPITAL_COMMUNITY): Payer: Medicare Other

## 2018-01-23 ENCOUNTER — Encounter (HOSPITAL_COMMUNITY): Payer: Self-pay | Admitting: Emergency Medicine

## 2018-01-23 ENCOUNTER — Telehealth: Payer: Self-pay | Admitting: Physician Assistant

## 2018-01-23 ENCOUNTER — Other Ambulatory Visit: Payer: Self-pay

## 2018-01-23 DIAGNOSIS — K219 Gastro-esophageal reflux disease without esophagitis: Secondary | ICD-10-CM | POA: Diagnosis present

## 2018-01-23 DIAGNOSIS — R42 Dizziness and giddiness: Secondary | ICD-10-CM | POA: Insufficient documentation

## 2018-01-23 DIAGNOSIS — I10 Essential (primary) hypertension: Secondary | ICD-10-CM | POA: Diagnosis present

## 2018-01-23 DIAGNOSIS — Z79899 Other long term (current) drug therapy: Secondary | ICD-10-CM | POA: Diagnosis not present

## 2018-01-23 DIAGNOSIS — R079 Chest pain, unspecified: Principal | ICD-10-CM | POA: Diagnosis present

## 2018-01-23 DIAGNOSIS — E785 Hyperlipidemia, unspecified: Secondary | ICD-10-CM | POA: Diagnosis present

## 2018-01-23 DIAGNOSIS — I200 Unstable angina: Secondary | ICD-10-CM | POA: Diagnosis present

## 2018-01-23 DIAGNOSIS — G4733 Obstructive sleep apnea (adult) (pediatric): Secondary | ICD-10-CM | POA: Diagnosis present

## 2018-01-23 DIAGNOSIS — I2510 Atherosclerotic heart disease of native coronary artery without angina pectoris: Secondary | ICD-10-CM | POA: Diagnosis present

## 2018-01-23 DIAGNOSIS — Z7901 Long term (current) use of anticoagulants: Secondary | ICD-10-CM | POA: Diagnosis not present

## 2018-01-23 DIAGNOSIS — Z87891 Personal history of nicotine dependence: Secondary | ICD-10-CM | POA: Insufficient documentation

## 2018-01-23 DIAGNOSIS — Z7982 Long term (current) use of aspirin: Secondary | ICD-10-CM | POA: Insufficient documentation

## 2018-01-23 LAB — BASIC METABOLIC PANEL
ANION GAP: 12 (ref 5–15)
BUN: 20 mg/dL (ref 6–20)
CALCIUM: 10.3 mg/dL (ref 8.9–10.3)
CO2: 25 mmol/L (ref 22–32)
Chloride: 103 mmol/L (ref 101–111)
Creatinine, Ser: 0.75 mg/dL (ref 0.44–1.00)
GFR calc Af Amer: >60 mL/min (ref >60)
GLUCOSE: 103 mg/dL — AB (ref 65–99)
Potassium: 3.7 mmol/L (ref 3.5–5.1)
Sodium: 140 mmol/L (ref 135–145)

## 2018-01-23 LAB — HEPATIC FUNCTION PANEL
ALT: 27 U/L (ref 14–54)
AST: 31 U/L (ref 15–41)
Albumin: 4.3 g/dL (ref 3.5–5.0)
Alkaline Phosphatase: 59 U/L (ref 38–126)
BILIRUBIN INDIRECT: 0.5 mg/dL (ref 0.3–0.9)
Bilirubin, Direct: 0.1 mg/dL (ref 0.1–0.5)
TOTAL PROTEIN: 7.4 g/dL (ref 6.5–8.1)
Total Bilirubin: 0.6 mg/dL (ref 0.3–1.2)

## 2018-01-23 LAB — CBC
HCT: 45.3 % (ref 36.0–46.0)
Hemoglobin: 15 g/dL (ref 12.0–15.0)
MCH: 31.3 pg (ref 26.0–34.0)
MCHC: 33.1 g/dL (ref 30.0–36.0)
MCV: 94.6 fL (ref 78.0–100.0)
Platelets: 202 10*3/uL (ref 150–400)
RBC: 4.79 MIL/uL (ref 3.87–5.11)
RDW: 13.3 % (ref 11.5–15.5)
WBC: 5.5 10*3/uL (ref 4.0–10.5)

## 2018-01-23 LAB — MAGNESIUM: Magnesium: 2 mg/dL (ref 1.7–2.4)

## 2018-01-23 LAB — TROPONIN I

## 2018-01-23 LAB — I-STAT TROPONIN, ED: TROPONIN I, POC: 0.01 ng/mL (ref 0.00–0.08)

## 2018-01-23 MED ORDER — HYDROCHLOROTHIAZIDE 25 MG PO TABS
25.0000 mg | ORAL_TABLET | Freq: Every day | ORAL | Status: DC
  Administered 2018-01-24: 25 mg via ORAL
  Filled 2018-01-23: qty 1

## 2018-01-23 MED ORDER — HYDROCODONE-ACETAMINOPHEN 7.5-325 MG PO TABS
1.0000 | ORAL_TABLET | Freq: Four times a day (QID) | ORAL | Status: DC | PRN
  Administered 2018-01-24: 1 via ORAL
  Filled 2018-01-23: qty 1

## 2018-01-23 MED ORDER — SPIRONOLACTONE 25 MG PO TABS
12.5000 mg | ORAL_TABLET | Freq: Every day | ORAL | Status: DC
  Administered 2018-01-24: 12.5 mg via ORAL
  Filled 2018-01-23 (×2): qty 1

## 2018-01-23 MED ORDER — LORAZEPAM 2 MG/ML IJ SOLN
0.5000 mg | Freq: Once | INTRAMUSCULAR | Status: AC
  Administered 2018-01-23: 0.5 mg via INTRAVENOUS
  Filled 2018-01-23: qty 1

## 2018-01-23 MED ORDER — MAGNESIUM OXIDE 400 (241.3 MG) MG PO TABS
400.0000 mg | ORAL_TABLET | Freq: Every day | ORAL | Status: DC
  Administered 2018-01-24: 400 mg via ORAL
  Filled 2018-01-23: qty 1

## 2018-01-23 MED ORDER — SODIUM CHLORIDE 0.9 % IV BOLUS
500.0000 mL | Freq: Once | INTRAVENOUS | Status: AC
  Administered 2018-01-23: 500 mL via INTRAVENOUS

## 2018-01-23 MED ORDER — HYDRALAZINE HCL 25 MG PO TABS
100.0000 mg | ORAL_TABLET | Freq: Three times a day (TID) | ORAL | Status: DC
  Administered 2018-01-23 – 2018-01-24 (×2): 100 mg via ORAL
  Filled 2018-01-23 (×2): qty 4

## 2018-01-23 MED ORDER — SIMVASTATIN 20 MG PO TABS
40.0000 mg | ORAL_TABLET | Freq: Every day | ORAL | Status: DC
  Administered 2018-01-23: 40 mg via ORAL
  Filled 2018-01-23: qty 2

## 2018-01-23 MED ORDER — METOPROLOL TARTRATE 25 MG PO TABS
25.0000 mg | ORAL_TABLET | Freq: Two times a day (BID) | ORAL | Status: DC
  Administered 2018-01-23 – 2018-01-24 (×2): 25 mg via ORAL
  Filled 2018-01-23 (×2): qty 1

## 2018-01-23 MED ORDER — ENOXAPARIN SODIUM 40 MG/0.4ML ~~LOC~~ SOLN
40.0000 mg | SUBCUTANEOUS | Status: DC
  Administered 2018-01-23: 40 mg via SUBCUTANEOUS
  Filled 2018-01-23: qty 0.4

## 2018-01-23 MED ORDER — AMLODIPINE BESYLATE 5 MG PO TABS
5.0000 mg | ORAL_TABLET | Freq: Every day | ORAL | Status: DC
  Administered 2018-01-24: 5 mg via ORAL
  Filled 2018-01-23: qty 1

## 2018-01-23 MED ORDER — ONDANSETRON HCL 4 MG/2ML IJ SOLN: 4.0000 mg | Freq: Four times a day (QID) | INTRAMUSCULAR | Status: DC | PRN

## 2018-01-23 MED ORDER — NITROGLYCERIN 0.4 MG SL SUBL: 0.4000 mg | SUBLINGUAL_TABLET | SUBLINGUAL | Status: DC | PRN

## 2018-01-23 MED ORDER — MECLIZINE HCL 12.5 MG PO TABS
25.0000 mg | ORAL_TABLET | Freq: Once | ORAL | Status: AC
  Administered 2018-01-23: 25 mg via ORAL
  Filled 2018-01-23: qty 2

## 2018-01-23 MED ORDER — ACETAMINOPHEN 500 MG PO TABS
1000.0000 mg | ORAL_TABLET | Freq: Once | ORAL | Status: AC
Start: 2018-01-23 — End: 2018-01-23
  Administered 2018-01-23: 1000 mg via ORAL
  Filled 2018-01-23: qty 2

## 2018-01-23 MED ORDER — LOSARTAN POTASSIUM 50 MG PO TABS
100.0000 mg | ORAL_TABLET | Freq: Every day | ORAL | Status: DC
  Administered 2018-01-24: 100 mg via ORAL
  Filled 2018-01-23: qty 2

## 2018-01-23 MED ORDER — ISOSORBIDE MONONITRATE ER 60 MG PO TB24
60.0000 mg | ORAL_TABLET | Freq: Every day | ORAL | Status: DC
  Administered 2018-01-24: 60 mg via ORAL
  Filled 2018-01-23: qty 1

## 2018-01-23 MED ORDER — ACETAMINOPHEN 325 MG PO TABS: 650.0000 mg | ORAL_TABLET | ORAL | Status: DC | PRN

## 2018-01-23 MED ORDER — SPIRONOLACTONE 25 MG PO TABS: 12.5000 mg | ORAL_TABLET | Freq: Every day | ORAL | 11 refills | Status: DC

## 2018-01-23 MED ORDER — ALPRAZOLAM 0.25 MG PO TABS: 0.2500 mg | ORAL_TABLET | Freq: Two times a day (BID) | ORAL | Status: DC | PRN

## 2018-01-23 MED ORDER — CLOPIDOGREL BISULFATE 75 MG PO TABS
75.0000 mg | ORAL_TABLET | Freq: Every day | ORAL | Status: DC
  Administered 2018-01-24: 75 mg via ORAL
  Filled 2018-01-23: qty 1

## 2018-01-23 MED ORDER — HYDRALAZINE HCL 50 MG PO TABS
100.0000 mg | ORAL_TABLET | Freq: Three times a day (TID) | ORAL | Status: DC
  Filled 2018-01-23 (×5): qty 2

## 2018-01-23 MED ORDER — FAMOTIDINE 20 MG PO TABS
40.0000 mg | ORAL_TABLET | Freq: Every day | ORAL | Status: DC
  Administered 2018-01-23: 40 mg via ORAL
  Filled 2018-01-23: qty 2

## 2018-01-23 MED ORDER — ASPIRIN EC 81 MG PO TBEC
81.0000 mg | DELAYED_RELEASE_TABLET | Freq: Every day | ORAL | Status: DC
  Administered 2018-01-24: 81 mg via ORAL
  Filled 2018-01-23: qty 1

## 2018-01-23 NOTE — ED Triage Notes (Addendum)
Pt bp has been in the 200's sys today.  Was told by o/c doc to take an extra bp pill, pt did so at 3pm.  At 4pm bp was still in the 200's sys.  Pt also report uncomfortable feeling in center of chest, but has eased off some since bp is down.

## 2018-01-23 NOTE — ED Notes (Signed)
Report given to 300 RN. 

## 2018-01-23 NOTE — ED Provider Notes (Signed)
[facility] EMERGENCY DEPARTMENT Provider Note   CSN: [id number] Arrival date & time: 01/23/18  1644     History   Chief Complaint Chief Complaint  Patient presents with  . Hypertension    HPI Heather Livingston is a 67 y.o. female.  HPI Patient presents with dizziness worse with movement and standing and associated with nausea.  States this worsened this afternoon.  She checked her blood pressure and her systolic blood pressure was greater than 200.  States she was quite anxious and was also having central chest pressure and left-sided neck pain.  Chest pressure has improved though she still has dizziness especially with movement.  Denies any focal weakness or numbness.  Denies any visual changes.  No shortness of breath.  Patient was recently hospitalized for chest pain and underwent cardiac catheterization.  No intervention.  Patient did have some medication adjustment.  Patient also was diagnosed with descending thoracic aneurysm measuring 4 cm.  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  Patient is following up with vascular surgery and neurosurgery for the aneurysms. Past Medical History:  Diagnosis Date  . Bradycardia   . Cataracts, bilateral   . Coronary atherosclerosis of native coronary artery    DES circ 10/10, residual 100% occluded SVG to OM, patent LIMA to LAD and SVG to PDA grafts, LVEF 50%  . GERD (gastroesophageal reflux disease)   . Hyperlipemia   . Hypertension   . MI (myocardial infarction) (HCC)   . OSA (obstructive sleep apnea)    On CPAP  . Palpitations    Transient PAT/AF, CHADS2 score 1    Patient Active Problem List   Diagnosis Date Noted  . Chest pain 01/23/2018  . Aneurysm of infrarenal abdominal aorta (HCC) 01/21/2018  . 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 Unstable angina (HCC) 01/19/2018  . Essential hypertension, benign 10/29/2010  . CORONARY ATHEROSCLEROSIS NATIVE CORONARY ARTERY 10/29/2010  .  SUPRAVENTRICULAR TACHYCARDIA 09/13/2010  . DYSLIPIDEMIA 08/27/2010  . 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 Palpitations 08/27/2010    Past Surgical History:  Procedure Laterality Date  . ABDOMINAL AORTOGRAM N/A 01/19/2018   Procedure: ABDOMINAL AORTOGRAM;  Surgeon: Corky Crafts, MD;  Location: [facility] INVASIVE CV LAB;  Service: Cardiovascular;  Laterality: N/A;  . ABDOMINAL HYSTERECTOMY    . CARPAL TUNNEL RELEASE    . CORONARY ARTERY BYPASS GRAFT     [facility], LIMA to LAD, SVG to OM, SVG to PDA  . LEFT HEART CATH AND CORS/GRAFTS ANGIOGRAPHY N/A 01/19/2018   Procedure: LEFT HEART CATH AND CORS/GRAFTS ANGIOGRAPHY;  Surgeon: Corky Crafts, MD;  Location: [facility] INVASIVE CV LAB;  Service: Cardiovascular;  Laterality: N/A;  . LEFT HEART CATHETERIZATION WITH CORONARY/GRAFT ANGIOGRAM N/A 12/27/2012   Procedure: LEFT HEART CATHETERIZATION WITH Isabel Caprice;  Surgeon: Herby Abraham, MD;  Location: [facility] CATH LAB;  Service: Cardiovascular;  Laterality: N/A;     OB History   None      Home Medications    Prior to Admission medications   Medication Sig Start Date End Date Taking? Authorizing Provider  albuterol (VENTOLIN HFA) 108 (90 Base) MCG/ACT inhaler Inhale 1-2 puffs into the lungs every 6 (six) hours as needed for shortness of breath.  12/19/16  Yes [provider]  amLODipine (NORVASC) 5 MG tablet Take 1 tablet (5 mg total) by mouth daily. 11/23/17  Yes Jonelle Sidle, MD  aspirin EC 81 MG tablet Take 1 tablet (81 mg total) by mouth  daily. 01/14/13  Yes Serpe, Clide Deutscher, PA-C  clopidogrel (PLAVIX) 75 MG tablet Take 1 tablet (75 mg total) by mouth daily. 11/23/17  Yes Jonelle Sidle, MD  hydrALAZINE (APRESOLINE) 100 MG tablet Take 1 tablet (100 mg total) by mouth 3 (three) times daily. 01/21/18  Yes Laverda Page B, NP  hydrochlorothiazide (HYDRODIURIL) 25 MG tablet TAKE 1 TABLET BY MOUTH EVERY DAY 04/02/17  Yes Jonelle Sidle, MD  HYDROcodone-acetaminophen  (NORCO) 7.5-325 MG tablet Take 1 tablet by mouth every 6 (six) hours as needed for moderate pain.   Yes [provider]  isosorbide mononitrate (IMDUR) 60 MG 24 hr tablet Take 1 tablet (60 mg total) by mouth daily. 11/23/17  Yes Jonelle Sidle, MD  losartan (COZAAR) 100 MG tablet Take 1 tablet (100 mg total) by mouth daily. 11/23/17  Yes Jonelle Sidle, MD  magnesium oxide (MAG-OX) 400 (241.3 Mg) MG tablet Take 1 tablet by mouth daily. 06/03/16  Yes [provider]  metoprolol tartrate (LOPRESSOR) 25 MG tablet Take 1 tablet (25 mg total) by mouth 2 (two) times daily. 01/21/18  Yes Arty Baumgartner, NP  nitroGLYCERIN (NITROSTAT) 0.4 MG SL tablet Place 1 tablet (0.4 mg total) under the tongue every 5 (five) minutes x 3 doses as needed for chest pain. 02/05/16  Yes Jonelle Sidle, MD  ranitidine (ZANTAC) 300 MG tablet Take 300 mg by mouth at bedtime.   Yes [provider]  simvastatin (ZOCOR) 40 MG tablet TAKE 1 TABLET BY MOUTH EVERYDAY AT BEDTIME 11/23/17  Yes Jonelle Sidle, MD  spironolactone (ALDACTONE) 25 MG tablet Take 0.5 tablets (12.5 mg total) by mouth daily. 01/23/18 01/23/19 Yes Azalee Course, PA    Family History Family History  Problem Relation Age of Onset  . Heart attack Father   . Hypertension Brother     Social History Social History   Tobacco Use  . Smoking status: Former Smoker    Packs/day: 0.75    Years: 43.00    Pack years: 32.25    Types: Cigarettes    Start date: 09/13/1965    Last attempt to quit: 10/14/2007    Years since quitting: 10.2  . Smokeless tobacco: Never Used  Substance Use Topics  . Alcohol use: Yes    Alcohol/week: 0.0 oz    Comment: Occasional wine or beer  . Drug use: No     Allergies   Patient has no known allergies.   Review of Systems Review of Systems  Constitutional: Negative for chills and fever.  HENT: Positive for congestion and sinus pressure.   Eyes: Negative for visual disturbance.    Respiratory: Negative for cough and shortness of breath.   Cardiovascular: Positive for chest pain. Negative for palpitations and leg swelling.  Gastrointestinal: Positive for nausea. Negative for abdominal pain, constipation, diarrhea and vomiting.  Genitourinary: Negative for dysuria, flank pain and frequency.  Musculoskeletal: Positive for gait problem. Negative for back pain, myalgias, neck pain and neck stiffness.  Skin: Negative for rash and wound.  Neurological: Positive for dizziness and light-headedness. Negative for speech difficulty, weakness, numbness and headaches.  All other systems reviewed and are negative.    Physical Exam Updated Vital Signs BP 121/69   Pulse 63   Temp 98 F (36.7 C) (Oral)   Resp 11   Ht 5' 5.5" (1.664 m)   Wt 84.8 kg (187 lb)   SpO2 94%   BMI 30.65 kg/m   Physical Exam  Constitutional: She is  oriented to person, place, and time. She appears well-developed and well-nourished.  Anxious appearing  HENT:  Head: Normocephalic and atraumatic.  Mouth/Throat: Oropharynx is clear and moist.  Eyes: Pupils are equal, round, and reactive to light. EOM are normal.  Few beats of horizontal nystagmus which is fatigable  Neck: Normal range of motion. Neck supple.  No bruits appreciated  Cardiovascular: Normal rate and regular rhythm. Exam reveals no gallop and no friction rub.  No murmur heard. Pulmonary/Chest: Effort normal and breath sounds normal. No stridor. No respiratory distress. She has no wheezes. She has no rales. She exhibits no tenderness.  Abdominal: Soft. Bowel sounds are normal. There is no tenderness. There is no rebound and no guarding.  Musculoskeletal: Normal range of motion. She exhibits no edema or tenderness.  No lower extremity swelling, asymmetry or tenderness.  Lymphadenopathy:    She has no cervical adenopathy.  Neurological: She is alert and oriented to person, place, and time.  Patient is alert and oriented x3 with clear,  goal oriented speech. Patient has 5/5 motor in all extremities. Sensation is intact to light touch. Bilateral finger-to-nose is normal with no signs of dysmetria..  Skin: Skin is warm and dry. Capillary refill takes less than 2 seconds. No rash noted. No erythema.  Psychiatric: She has a normal mood and affect. Her behavior is normal.  Nursing note and vitals reviewed.    ED Treatments / Results  Labs (all labs ordered are listed, but only abnormal results are displayed) Labs Reviewed  BASIC METABOLIC PANEL - Abnormal; Notable for the following components:      Result Value   Glucose, Bld 103 (*)    All other components within normal limits  CBC  TROPONIN I  HEPATIC FUNCTION PANEL  I-STAT TROPONIN, ED    EKG EKG Interpretation  Date/Time:  Saturday January 23 2018 16:55:58 EDT Ventricular Rate:  75 PR Interval:  158 QRS Duration: 108 QT Interval:  384 QTC Calculation: 428 R Axis:   54 Text Interpretation:  Sinus rhythm with marked sinus arrhythmia Inferior infarct , age undetermined T wave abnormality, consider anterolateral ischemia Abnormal ECG Confirmed by Loren RacerYelverton, Eligh Rybacki ([id number]) on 01/23/2018 9:56:15 PM   Radiology Dg Chest 2 View  Result Date: 01/23/2018 CLINICAL DATA:  Chest pain and dizziness and shortness of breath for 1 day. Hypertension. EXAM: CHEST - 2 VIEW COMPARISON:  01/19/2018 FINDINGS: The heart size and mediastinal contours are within normal limits. Aortic atherosclerosis. Prior CABG again noted. Both lungs are clear. No evidence of pneumothorax or pleural effusion. The visualized skeletal structures are unremarkable. IMPRESSION: No active cardiopulmonary disease. Electronically Signed   By: Myles RosenthalJohn  Stahl M.D.   On: 01/23/2018 18:26    Procedures Procedures (including critical care time)  Medications Ordered in ED Medications  sodium chloride 0.9 % bolus 500 mL (has no administration in time range)  LORazepam (ATIVAN) injection 0.5 mg (has no administration  in time range)  LORazepam (ATIVAN) injection 0.5 mg (0.5 mg Intravenous Given 01/23/18 1804)  meclizine (ANTIVERT) tablet 25 mg (25 mg Oral Given 01/23/18 1942)  acetaminophen (TYLENOL) tablet 1,000 mg (1,000 mg Oral Given 01/23/18 1942)     Initial Impression / Assessment and Plan / ED Course  I have reviewed the triage vital signs and the nursing notes.  Pertinent labs & imaging results that were available during my care of the patient were reviewed by me and considered in my medical decision making (see chart for details).     Patient  with persistent dizziness and unsteady gait despite multiple medications.  Troponin is normal.  No evidence of ischemia on EKG.  Low suspicion for ruptured cerebellar or thoracic aneurysms.  Discussed with hospitalist who will admit for rule out and treatment of the patient's vertigo Final Clinical Impressions(s) / ED Diagnoses   Final diagnoses:  Chest pain syndrome  Dizziness    ED Discharge Orders    None       Loren Racer, MD 01/23/18 2156

## 2018-01-23 NOTE — H&P (Signed)
History and Physical    Heather Livingston NGE:[id number] DOB: [birth year] DOA: 01/23/2018  PCP: Theodoro Kos, MD   Patient coming from: Home.  I have personally briefly reviewed patient's old medical records in [facility]  Chief Complaint: High blood pressure and chest discomfort.  HPI: Heather Livingston is a 67 y.o. female with medical history significant of bradycardia, bilateral cataracts, CAD of native coronary artery, GERD, hyperlipidemia, hypertension, OSA on CPAP, history of transient PAF who is coming to the emergency department with complaints of data blood pressure, dizziness , nausea and central chest pain.  Chest pain is pressure-like, associated with palpitations, dyspnea and dizziness.  Denies PND, orthopnea or recent pitting edema of the lower extremities.  She underwent a cardiac cath this week, which showed stable CAD and only medical therapy was recommended.  There has not been abdominal pain, emesis, diarrhea, constipation, melena or hematochezia.  No dysuria, frequency or hematuria.  No heat or cold intolerance.  No polyuria, polydipsia or polyphagia.  ED Course: Initial temperature 36.7C (98.0 F), pulse 70, respiration 20, blood pressure 140/70 mmHg and O2 sat is 97% on room air.  The patient received a 500 mL of normal saline bolus, Tylenol 1000 mg p.o. x1 dose, meclizine 25 mg p.o. x1 dose and 2 doses of lorazepam 0.5 mg IVP.  Her workup shows a white count of 5.5, hemoglobin 15.0 g/dL and platelets 401.  BMP shows a nonfasting glucose of 103 mg/dL, but no other abnormality.  LFTs are normal. Troponin x2 has been negative.  EKG was sinus rhythm with marked sinus arrhythmia and nonspecific T wave abnormality.  A 2 view chest radiograph study did not show any acute cardiopulmonary pathology.  Review of Systems: As per HPI otherwise 10 point review of systems negative.    Past Medical History:  Diagnosis Date  . Bradycardia   . Cataracts, bilateral     . Coronary atherosclerosis of native coronary artery    DES circ 10/10, residual 100% occluded SVG to OM, patent LIMA to LAD and SVG to PDA grafts, LVEF 50%  . GERD (gastroesophageal reflux disease)   . Hyperlipemia   . Hypertension   . MI (myocardial infarction) (HCC)   . OSA (obstructive sleep apnea)    On CPAP  . Palpitations    Transient PAT/AF, CHADS2 score 1    Past Surgical History:  Procedure Laterality Date  . ABDOMINAL AORTOGRAM N/A 01/19/2018   Procedure: ABDOMINAL AORTOGRAM;  Surgeon: Corky Crafts, MD;  Location: [facility] INVASIVE CV LAB;  Service: Cardiovascular;  Laterality: N/A;  . ABDOMINAL HYSTERECTOMY    . CARPAL TUNNEL RELEASE    . CORONARY ARTERY BYPASS GRAFT     [facility], LIMA to LAD, SVG to OM, SVG to PDA  . LEFT HEART CATH AND CORS/GRAFTS ANGIOGRAPHY N/A 01/19/2018   Procedure: LEFT HEART CATH AND CORS/GRAFTS ANGIOGRAPHY;  Surgeon: Corky Crafts, MD;  Location: [facility] INVASIVE CV LAB;  Service: Cardiovascular;  Laterality: N/A;  . LEFT HEART CATHETERIZATION WITH CORONARY/GRAFT ANGIOGRAM N/A 12/27/2012   Procedure: LEFT HEART CATHETERIZATION WITH Isabel Caprice;  Surgeon: Herby Abraham, MD;  Location: [facility] CATH LAB;  Service: Cardiovascular;  Laterality: N/A;     reports that she quit smoking about 10 years ago. Her smoking use included cigarettes. She started smoking about 52 years ago. She has a 32.25 pack-year smoking history. She has never used smokeless tobacco. She reports that she drinks alcohol. She reports that she does not  use drugs.  No Known Allergies  Family History  Problem Relation Age of Onset  . Heart attack Father   . Hypertension Brother     Prior to Admission medications   Medication Sig Start Date End Date Taking? Authorizing Provider  albuterol (VENTOLIN HFA) 108 (90 Base) MCG/ACT inhaler Inhale 1-2 puffs into the lungs every 6 (six) hours as needed for shortness of breath.  12/19/16  Yes [provider]   amLODipine (NORVASC) 5 MG tablet Take 1 tablet (5 mg total) by mouth daily. 11/23/17  Yes Jonelle Sidle, MD  aspirin EC 81 MG tablet Take 1 tablet (81 mg total) by mouth daily. 01/14/13  Yes Serpe, Clide Deutscher, PA-C  clopidogrel (PLAVIX) 75 MG tablet Take 1 tablet (75 mg total) by mouth daily. 11/23/17  Yes Jonelle Sidle, MD  hydrALAZINE (APRESOLINE) 100 MG tablet Take 1 tablet (100 mg total) by mouth 3 (three) times daily. 01/21/18  Yes Laverda Page B, NP  hydrochlorothiazide (HYDRODIURIL) 25 MG tablet TAKE 1 TABLET BY MOUTH EVERY DAY 04/02/17  Yes Jonelle Sidle, MD  HYDROcodone-acetaminophen (NORCO) 7.5-325 MG tablet Take 1 tablet by mouth every 6 (six) hours as needed for moderate pain.   Yes [provider]  isosorbide mononitrate (IMDUR) 60 MG 24 hr tablet Take 1 tablet (60 mg total) by mouth daily. 11/23/17  Yes Jonelle Sidle, MD  losartan (COZAAR) 100 MG tablet Take 1 tablet (100 mg total) by mouth daily. 11/23/17  Yes Jonelle Sidle, MD  magnesium oxide (MAG-OX) 400 (241.3 Mg) MG tablet Take 1 tablet by mouth daily. 06/03/16  Yes [provider]  metoprolol tartrate (LOPRESSOR) 25 MG tablet Take 1 tablet (25 mg total) by mouth 2 (two) times daily. 01/21/18  Yes Arty Baumgartner, NP  nitroGLYCERIN (NITROSTAT) 0.4 MG SL tablet Place 1 tablet (0.4 mg total) under the tongue every 5 (five) minutes x 3 doses as needed for chest pain. 02/05/16  Yes Jonelle Sidle, MD  ranitidine (ZANTAC) 300 MG tablet Take 300 mg by mouth at bedtime.   Yes [provider]  simvastatin (ZOCOR) 40 MG tablet TAKE 1 TABLET BY MOUTH EVERYDAY AT BEDTIME 11/23/17  Yes Jonelle Sidle, MD  spironolactone (ALDACTONE) 25 MG tablet Take 0.5 tablets (12.5 mg total) by mouth daily. 01/23/18 01/23/19 Yes Azalee Course, PA    Physical Exam: Vitals:   01/23/18 2100 01/23/18 2130 01/23/18 2200 01/23/18 2304  BP: 121/65 132/74 122/72 (!) 139/55  Pulse: 69 (!) 45 68 61  Resp: 14 16 13  20   Temp:    (!) 97.5 F (36.4 C)  TempSrc:    Oral  SpO2: 93% 94% 96% 95%  Weight:      Height:        Constitutional: Somnolent due to lorazepam, but in NAD. Eyes: PERRL, lids and conjunctivae normal ENMT: Mucous membranes are moist. Posterior pharynx clear of any exudate or lesions. Neck: normal, supple, no masses, no thyromegaly Respiratory: Decreased breath sounds on bases, but otherwise clear to auscultation bilaterally, no wheezing, no crackles. Normal respiratory effort. No accessory muscle use.  Cardiovascular: Regular rate and rhythm, no murmurs / rubs / gallops. No extremity edema. 2+ pedal pulses. No carotid bruits.  Abdomen: Obese, soft, no tenderness, no masses palpated. No hepatosplenomegaly. Bowel sounds positive.  Musculoskeletal: no clubbing / cyanosis. Good ROM, no contractures. Normal muscle tone.  Skin: no rashes, lesions, ulcers on limited dermatological examination. Neurologic: CN 2-12 grossly intact. Sensation intact,  DTR normal. Strength 5/5 in all 4.  Psychiatric: Somnolent.  Answers questions, but quickly returns to sleep.   Labs on Admission: I have personally reviewed following labs and imaging studies  CBC: Recent Labs  Lab 01/19/18 1122 01/20/18 0731 01/21/18 0007 01/23/18 1747  WBC 5.2 4.8 4.6 5.5  HGB 14.3 13.8 13.8 15.0  HCT 43.9 41.4 41.3 45.3  MCV 94.8 95.0 95.2 94.6  PLT 205 189 176 202   Basic Metabolic Panel: Recent Labs  Lab 01/19/18 1122 01/20/18 0731 01/23/18 1747 01/23/18 2217  NA 139 137 140  --   K 3.9 3.7 3.7  --   CL 104 105 103  --   CO2 26 22 25   --   GLUCOSE 95 95 103*  --   BUN 18 15 20   --   CREATININE 0.71 0.72 0.75  --   CALCIUM 9.1 9.0 10.3  --   MG 1.9  --   --  2.0   GFR: Estimated Creatinine Clearance: 74.1 mL/min (by C-G formula based on SCr of 0.75 mg/dL). Liver Function Tests: Recent Labs  Lab 01/23/18 1759  AST 31  ALT 27  ALKPHOS 59  BILITOT 0.6  PROT 7.4  ALBUMIN 4.3   No results for  input(s): LIPASE, AMYLASE in the last 168 hours. No results for input(s): AMMONIA in the last 168 hours. Coagulation Profile: Recent Labs  Lab 01/19/18 1122  INR 0.95   Cardiac Enzymes: Recent Labs  Lab 01/19/18 1952 01/20/18 0206 01/20/18 0731 01/23/18 1747  TROPONINI <0.03 <0.03 0.03* <0.03   BNP (last 3 results) No results for input(s): PROBNP in the last 8760 hours. HbA1C: No results for input(s): HGBA1C in the last 72 hours. CBG: No results for input(s): GLUCAP in the last 168 hours. Lipid Profile: No results for input(s): CHOL, HDL, LDLCALC, TRIG, CHOLHDL, LDLDIRECT in the last 72 hours. Thyroid Function Tests: No results for input(s): TSH, T4TOTAL, FREET4, T3FREE, THYROIDAB in the last 72 hours. Anemia Panel: No results for input(s): VITAMINB12, FOLATE, FERRITIN, TIBC, IRON, RETICCTPCT in the last 72 hours. Urine analysis: No results found for: COLORURINE, APPEARANCEUR, LABSPEC, PHURINE, GLUCOSEU, HGBUR, BILIRUBINUR, KETONESUR, PROTEINUR, UROBILINOGEN, NITRITE, LEUKOCYTESUR  Radiological Exams on Admission: Dg Chest 2 View  Result Date: 01/23/2018 CLINICAL DATA:  Chest pain and dizziness and shortness of breath for 1 day. Hypertension. EXAM: CHEST - 2 VIEW COMPARISON:  01/19/2018 FINDINGS: The heart size and mediastinal contours are within normal limits. Aortic atherosclerosis. Prior CABG again noted. Both lungs are clear. No evidence of pneumothorax or pleural effusion. The visualized skeletal structures are unremarkable. IMPRESSION: No active cardiopulmonary disease. Electronically Signed   By: Myles RosenthalJohn  Stahl M.D.   On: 01/23/2018 18:26   01/19/2018  ABDOMINAL AORTOGRAM  LEFT HEART CATH AND CORS/GRAFTS ANGIOGRAPHY   Conclusion     Prox RCA lesion is 100% stenosed. SVG to PDA is patent.  Prox LAD lesion is 100% stenosed. LIMA to LAD is patent.  Mid LM to Dist LM lesion is 25% stenosed.  Mid Cx lesion is 50% stenosed. SVG to OM is occluded, known from  prior.  The left ventricular systolic function is normal.  LV end diastolic pressure is mildly elevated.  The left ventricular ejection fraction is 50-55% by visual estimate.  There is no aortic valve stenosis.  Infrarenal AAA. PAD including right common iliac artery stenosis.  Bilateral common iliac arteries appear ectatic.   Medical therapy for CAD.  Stable from prior cath. No acute lesion detected.  She has a large AAA.  There is a significant right common iliac lesion, with a 30 mm Hg gradient.  She will need a consult for her vascular disease.     EKG: Independently reviewed. Vent. rate 75 BPM PR interval 158 ms QRS duration 108 ms QT/QTc 384/428 ms P-R-T axes 56 54 264 Sinus rhythm with marked sinus arrhythmia Inferior infarct , age undetermined T wave abnormality, consider anterolateral ischemia Abnormal ECG When compared to previous, sinus bradycardia has resolved.  Assessment/Plan Principal Problem:   Chest pain   Unstable angina (HCC) Observation/telemetry. Continue supplemental oxygen. Trend troponin level. Check EKG in a.m. Continue aspirin 81 mg p.o. daily. Continue clopidogrel 75 mg p.o. daily. Continue metoprolol 25 mg p.o. twice daily. Continue isosorbide mononitrate 60 mg p.o. daily. Sublingual nitroglycerin as needed.  Active Problems:   CORONARY ATHEROSCLEROSIS NATIVE CORONARY ARTERY As above mentioned. Continue aspirin, Plavix, metoprolol, Imdur and simvastatin.    Essential hypertension, benign Continue amlodipine 5 mg p.o. daily. Continue metoprolol 25 mg p.o. twice daily. Continue losartan 100 mg p.o. daily. Continue spironolactone 12.5 mg p.o. daily. Monitor blood pressure, heart rate, renal function and electrolytes.    Hyperlipemia Continue simvastatin 40 mg p.o. daily. Monitor LFTs as needed. Fasting lipid panel follow-up per PCP.    GERD (gastroesophageal reflux disease) Continue ranitidine 300 mg or pharmacy formulary  equivalent.     OSA (obstructive sleep apnea) Continue CPAP at bedtime.    DVT prophylaxis: Lovenox SQ. Code Status: Full code. Family Communication:  Disposition Plan: Observation for chest pain rule out and blood pressure control. Consults called:  Admission status: Observation/telemetry.   Bobette Mo MD [facility] Pager [phone].  If 7PM-7AM, please contact night-coverage [email or web address] Password [facility]  01/23/2018, 11:46 PM

## 2018-01-23 NOTE — Telephone Encounter (Signed)
Patient recently underwent cardiac catheterization also noted to have a aortic aneurysm as well.  She contacted after our answering service complaining of blood pressure 200/100.  I have verified her medication list, she is currently on 6 blood pressure medications including amlodipine 5 mg every a.m., hydralazine 100 mg 3 times daily, HCTZ 25 mg every a.m., Imdur 60 mg a.m., Cozaar 100 mg am, metoprolol 25 mg twice daily.  She says she has been compliant with each one of them.  Her blood pressure was in the 140s this morning and gradually trended up to 200.  I instructed her to take another 5 mg amlodipine this afternoon and then switch it to 10 mg daily of amlodipine starting tomorrow.  I will also add 12.5 mg daily of spironolactone as well.  She will need 1 week basic metabolic panel to check her electrolyte and renal function.  She has been instructed to turn off the light and rest on the bed for the next 2 hours, if after taking extra blood pressure medication her systolic blood pressure is still over 200 in 2 hours, she has been instructed to go to the emergency room.  Otherwise she needs cardiology office visit within the next 5 days to help control her blood pressure.  It may be reasonable to do a renal artery ultrasound as well to make sure she does not have any renal artery stenosis.  Otherwise her only complaint is some dizziness and shortness of breath, she does not have any chest pain or other discomfort.  Ramond DialSigned, Yassmine Tamm PA Pager: [phone]

## 2018-01-24 DIAGNOSIS — I10 Essential (primary) hypertension: Secondary | ICD-10-CM

## 2018-01-24 DIAGNOSIS — E785 Hyperlipidemia, unspecified: Secondary | ICD-10-CM

## 2018-01-24 DIAGNOSIS — I2510 Atherosclerotic heart disease of native coronary artery without angina pectoris: Secondary | ICD-10-CM

## 2018-01-24 DIAGNOSIS — R079 Chest pain, unspecified: Secondary | ICD-10-CM

## 2018-01-24 DIAGNOSIS — K219 Gastro-esophageal reflux disease without esophagitis: Secondary | ICD-10-CM

## 2018-01-24 LAB — TROPONIN I

## 2018-01-24 MED ORDER — PROCHLORPERAZINE EDISYLATE 5 MG/ML IJ SOLN
10.0000 mg | INTRAMUSCULAR | Status: AC
  Administered 2018-01-24: 10 mg via INTRAVENOUS
  Filled 2018-01-24: qty 2

## 2018-01-24 MED ORDER — MECLIZINE HCL 12.5 MG PO TABS
25.0000 mg | ORAL_TABLET | Freq: Three times a day (TID) | ORAL | Status: DC
  Administered 2018-01-24: 25 mg via ORAL
  Filled 2018-01-24: qty 2

## 2018-01-24 MED ORDER — MECLIZINE HCL 25 MG PO TABS: 25.0000 mg | ORAL_TABLET | Freq: Three times a day (TID) | ORAL | 0 refills | Status: DC

## 2018-01-24 NOTE — Evaluation (Signed)
Physical Therapy Evaluation Patient Details Name: Heather Livingston MRN: [id number] DOB: 1951-07-15 Today's Date: 01/24/2018   History of Present Illness     Heather Livingston is a 67 y.o. female with medical history significant of bradycardia, bilateral cataracts, CAD of native coronary artery, GERD, hyperlipidemia, hypertension, OSA on CPAP, history of transient PAF who is coming to the emergency department with complaints of data blood pressure, dizziness , nausea and central chest pain.  Chest pain is pressure-like, associated with palpitations, dyspnea and dizziness.  Denies PND, orthopnea or recent pitting edema of the lower extremities.  She underwent a cardiac cath this week, which showed stable CAD and only medical therapy was recommended.  There has not been abdominal pain, emesis, diarrhea, constipation, melena or hematochezia.  No dysuria, frequency or hematuria.  No heat or cold intolerance.  No polyuria, polydipsia or polyphagia.  Clinical Impression   Patient functioning near baseline, continues to c/o of symptoms that brought her to hospital, c/o lightheadedness upon sitting up that worsens when looking down or up, able to ambulate in hallway with mostly hand held assist or leaning on walls, side rails without loss of balance.  Patient mostly limited due to c/o chronic bilateral hip pain and usually has to take pain medication before completing community ADLs such as shopping for groceries and patient states her spouse can help her home.  Patient will benefit from continued physical therapy in hospital and recommended venue below to increase strength, balance, endurance for safe ADLs and gait.    Follow Up Recommendations Home health PT;Supervision for mobility/OOB    Equipment Recommendations       Recommendations for Other Services       Precautions / Restrictions Precautions Precautions: Fall Restrictions Weight Bearing Restrictions: No      Mobility  Bed  Mobility Overal bed mobility: Modified Independent                Transfers Overall transfer level: Needs assistance Equipment used: None Transfers: Sit to/from Stand Sit to Stand: Supervision Stand pivot transfers: Min guard       General transfer comment: tends to lean on nearby objects for support  Ambulation/Gait Ambulation/Gait assistance: Min guard Ambulation Distance (Feet): 50 Feet Assistive device: None;1 person hand held assist Gait Pattern/deviations: Decreased step length - right;Decreased step length - left;Decreased stride length Gait velocity: decreased   General Gait Details: slow labored cadence with frequent use of walls, side rails in hallway, c/o mild lightheadedness, limited mostly due to c/o bilateral hip pain  Stairs            Wheelchair Mobility    Modified Rankin (Stroke Patients Only)       Balance Overall balance assessment: Needs assistance Sitting-balance support: Feet supported;No upper extremity supported Sitting balance-Leahy Scale: Good     Standing balance support: No upper extremity supported;During functional activity Standing balance-Leahy Scale: Fair Standing balance comment: frequent leaning on nearby objects                             Pertinent Vitals/Pain Pain Assessment: 0-10 Pain Score: 7  Pain Location: bilateral hips, low back Pain Descriptors / Indicators: Aching;Discomfort Pain Intervention(s): Limited activity within patient's tolerance;Monitored during session    Home Living Family/patient expects to be discharged to:: Private residence Living Arrangements: Spouse/significant other   Type of Home: House Home Access: Stairs to enter Entrance Stairs-Rails: Right;Left;Can reach both Entrance Stairs-Number of Steps: 6  Home Layout: One level Home Equipment: Walker - 2 wheels;Cane - single point;Bedside commode;Shower seat      Prior Function Level of Independence: Independent                Hand Dominance        Extremity/Trunk Assessment   Upper Extremity Assessment Upper Extremity Assessment: Generalized weakness    Lower Extremity Assessment Lower Extremity Assessment: Generalized weakness    Cervical / Trunk Assessment Cervical / Trunk Assessment: Normal  Communication   Communication: No difficulties  Cognition Arousal/Alertness: Awake/alert Behavior During Therapy: WFL for tasks assessed/performed Overall Cognitive Status: Within Functional Limits for tasks assessed                                        General Comments      Exercises     Assessment/Plan    PT Assessment Patient needs continued PT services  PT Problem List Decreased strength;Decreased activity tolerance;Decreased balance;Decreased mobility       PT Treatment Interventions Gait training;Stair training;Functional mobility training;Therapeutic activities;Therapeutic exercise;Patient/family education    PT Goals (Current goals can be found in the Care Plan section)  Acute Rehab PT Goals Patient Stated Goal: return home PT Goal Formulation: With patient Time For Goal Achievement: 01/27/18 Potential to Achieve Goals: Good    Frequency Min 3X/week   Barriers to discharge        Co-evaluation               AM-PAC PT "6 Clicks" Daily Activity  Outcome Measure Difficulty turning over in bed (including adjusting bedclothes, sheets and blankets)?: None Difficulty moving from lying on back to sitting on the side of the bed? : None Difficulty sitting down on and standing up from a chair with arms (e.g., wheelchair, bedside commode, etc,.)?: None Help needed moving to and from a bed to chair (including a wheelchair)?: A Little Help needed walking in hospital room?: A Little Help needed climbing 3-5 steps with a railing? : A Little 6 Click Score: 21    End of Session Equipment Utilized During Treatment: Gait belt Activity Tolerance: Patient  tolerated treatment well;Patient limited by pain Patient left: in bed;with call bell/phone within reach;with bed alarm set Nurse Communication: Mobility status PT Visit Diagnosis: Unsteadiness on feet (R26.81);Other abnormalities of gait and mobility (R26.89);Muscle weakness (generalized) (M62.81)    Time: [id number] PT Time Calculation (min) (ACUTE ONLY): 27 min   Charges:   PT Evaluation $PT Eval Moderate Complexity: 1 Mod PT Treatments $Therapeutic Activity: 23-37 mins   PT G Codes:        12:15 PM, 01/24/18 Ocie BobJames Tashika Goodin, MPT [facility] Therapist [phone] office [phone] mobile phone

## 2018-01-24 NOTE — Discharge Summary (Signed)
Physician Discharge Summary  Heather Livingston ZOX:[id number] DOB: Jun 18, 1951 DOA: 01/23/2018  PCP: Theodoro Kos, MD  Admit date: 01/23/2018 Discharge date: 01/24/2018  Admitted From: Home Disposition: Home  Recommendations for Outpatient Follow-up:  1. Follow up with PCP in 1-2 weeks 2. Please obtain BMP/CBC in one week 3. Follow-up with neurosurgery on 5/1 as previously scheduled to discuss cerebral angiogram with coiling of aneurysm.  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  She was following her blood pressure at home was noted to be elevated with a systolic greater than 200.  After several checks, this did not trend down, she came to the ER for evaluation.  In retrospect, husband reports that the blood pressure machine at home may not be working appropriately.  Workup in the hospital has been unrevealing.  She ruled out for ACS with negative cardiac markers.  She also complains of dizziness and diplopia on lateral gaze.  This is been present since her previous admission.  She has not had any new neurologic findings or changes in her symptoms.  Her neurologic exam is otherwise unrevealing.  Her symptoms may be related to peripheral vertigo versus possibly related to her aneurysm.  Her case was reviewed with neurosurgery on-call and no further imaging/recommendations at this time.  It was advised that she keep her outpatient appointment on May 1 to discuss further cerebral angiogram with coiling of aneurysm.  She was seen by physical therapy who recommended  outpatient vestibular therapy.  Patient will be discharged home.  She is in agreement with this plan.  Discharge Diagnoses:  Principal Problem:   Chest pain Active Problems:   Hyperlipemia   Essential hypertension, benign   CORONARY ATHEROSCLEROSIS NATIVE CORONARY ARTERY   Unstable angina (HCC)   GERD (gastroesophageal reflux disease)   OSA (obstructive sleep apnea)    Discharge Instructions  Discharge Instructions    Diet - low sodium heart healthy   Complete by:  As directed    Increase activity slowly   Complete by:  As directed      Allergies as of 01/24/2018   No Known Allergies     Medication List    TAKE these medications   amLODipine 5 MG tablet Commonly known as:  NORVASC Take 1 tablet (5 mg total) by mouth daily.   aspirin EC 81 MG tablet Take 1 tablet (81 mg total) by mouth daily.   clopidogrel 75 MG tablet Commonly known as:  PLAVIX Take 1 tablet (75 mg total) by mouth daily.   hydrALAZINE 100 MG tablet Commonly known as:  APRESOLINE Take 1 tablet (100 mg total) by mouth 3 (three) times daily.   hydrochlorothiazide 25 MG tablet Commonly known as:  HYDRODIURIL TAKE 1 TABLET BY MOUTH EVERY DAY   HYDROcodone-acetaminophen 7.5-325 MG tablet Commonly known as:  NORCO Take 1 tablet by mouth every 6 (six) hours as needed for moderate pain.   isosorbide mononitrate 60 MG 24 hr tablet Commonly known as:  IMDUR Take 1 tablet (60 mg total) by mouth daily.   losartan 100 MG tablet Commonly known as:  COZAAR Take 1 tablet (100 mg total) by mouth  daily.   magnesium oxide 400 (241.3 Mg) MG tablet Commonly known as:  MAG-OX Take 1 tablet by mouth daily.   meclizine 25 MG tablet Commonly known as:  ANTIVERT Take 1 tablet (25 mg total) by mouth 3 (three) times daily.   metoprolol tartrate 25 MG tablet Commonly known as:  LOPRESSOR Take 1 tablet (25 mg total) by mouth 2 (two) times daily.   nitroGLYCERIN 0.4 MG SL tablet Commonly known as:   NITROSTAT Place 1 tablet (0.4 mg total) under the tongue every 5 (five) minutes x 3 doses as needed for chest pain.   ranitidine 300 MG tablet Commonly known as:  ZANTAC Take 300 mg by mouth at bedtime.   simvastatin 40 MG tablet Commonly known as:  ZOCOR TAKE 1 TABLET BY MOUTH EVERYDAY AT BEDTIME   spironolactone 25 MG tablet Commonly known as:  ALDACTONE Take 0.5 tablets (12.5 mg total) by mouth daily.   VENTOLIN HFA 108 (90 Base) MCG/ACT inhaler Generic drug:  albuterol Inhale 1-2 puffs into the lungs every 6 (six) hours as needed for shortness of breath.      Follow-up Information    Lisbeth Renshaw, MD Follow up.   Specialty:  Neurosurgery Why:  follow up for your appointment on may 1st as previously scheduled Contact information: [address] [place] [address] [phone]        Care, [facility] Follow up.   Why:  Physical therapist will contact you for appointment on Tuesday.  Contact information: [address] [place] [address] [phone]          No Known Allergies  Consultations:     Procedures/Studies: Ct Angio Head W Or Wo Contrast  Result Date: 01/19/2018 CLINICAL DATA:  Acute presentation with dizziness beginning today. 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 EXAM: CT ANGIOGRAPHY HEAD AND NECK TECHNIQUE: Multidetector CT imaging of the head and neck was performed using the standard protocol during bolus administration of intravenous contrast. Multiplanar CT image reconstructions and MIPs were obtained to evaluate the vascular anatomy. Carotid stenosis measurements (when applicable) are obtained utilizing NASCET criteria, using the distal internal carotid diameter as the denominator. CONTRAST:  75mL ISOVUE-370 IOPAMIDOL (ISOVUE-370) INJECTION 76% COMPARISON:  CT same day FINDINGS: CTA NECK FINDINGS Aortic arch: Pronounced aortic atherosclerosis. No aneurysm or dissection. Branching pattern of the  brachiocephalic vessels from the arch is normal. No origin stenosis. Ectasia of the proximal subclavian artery. Right carotid system: Common carotid artery shows atherosclerotic plaque but is widely patent to the bifurcation. Soft and calcified plaque at the carotid bifurcation and ICA bulb. Minimal diameter of the proximal ICA is 5 mm, this same as the more distal cervical ICA. Therefore there is no stenosis. 70% stenosis of the proximal ECA. Marked tortuosity of the cervical ICA. In the region of maximal tortuosity, at the C3 level, there is irregularity of the ICA consistent with fibromuscular disease. There is a pseudo aneurysm in this location measuring 6 mm in diameter. Left carotid system: Common carotid artery shows some non stenotic plaque and is widely patent to the bifurcation region. Calcified plaque at the carotid bifurcation and ICA bulb. Minimal diameter in the ICA bulb is 4 mm. Compared to a more distal cervical ICA diameter of 5 mm, this indicates a 20% stenosis. 70% stenosis of the proximal ECA. Cervical internal carotid artery is markedly tortuous on this side as well. No pseudo aneurysm seen on this side. Beneath the skull base, the vessel  is ectatic measuring up to 7 mm in diameter. Vertebral arteries: Right vertebral artery origin is widely patent. 30% stenosis of the left vertebral artery origin. Both proximal vertebral arteries are markedly tortuous. Beyond that, the vertebral arteries show some scattered atherosclerotic plaque but are widely patent through the cervical region to the foramen magnum. Skeleton: Mid cervical spondylosis with prominent osteophytes at C5-6 and C6-7. Other neck: No mass or lymphadenopathy. Upper chest: Emphysema and pulmonary scarring.  No active process. Review of the MIP images confirms the above findings CTA HEAD FINDINGS Anterior circulation: Both internal carotid arteries are patent through the skull base and siphon regions. There is peripheral atherosclerotic  calcification in the carotid siphon regions but no stenosis greater than 30%. Supraclinoid internal carotid arteries are widely patent. The anterior and middle cerebral vessels are widely patent. No stenosis, aneurysm or vascular malformation. Posterior circulation: Both vertebral arteries are patent at the foramen magnum level. There is atherosclerotic plaque of the V4 segment on the left with 30% stenosis. Both vertebral arteries are patent to the basilar. No basilar stenosis. There is a basilar tip aneurysm measuring 9 mm in diameter with a wide mouth. The aneurysm projects slightly more towards the left. Diameter of the mouth is 4 mm. The aneurysm originates between the left superior cerebellar and left posterior cerebral arteries. Venous sinuses: Patent and normal. Anatomic variants: None significant Delayed phase: No abnormal enhancement. 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 Advanced aortic atherosclerosis. Atherosclerotic change at both carotid bifurcations. No stenosis of the right ICA. 20% stenosis of the left ICA. Both cervical internal carotid arteries are markedly tortuous. There is probably bilateral fibromuscular change, more pronounced on the right. 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 Electronically Signed   By: Paulina Fusi M.D.   On: 01/19/2018 13:54   Dg Chest 2 View  Result Date: 01/23/2018 CLINICAL DATA:  Chest pain and dizziness and shortness of breath for 1 day. Hypertension. EXAM: CHEST - 2 VIEW COMPARISON:  01/19/2018 FINDINGS: The heart size and mediastinal contours are within normal limits. Aortic atherosclerosis. Prior CABG again noted. Both lungs are clear. No evidence of pneumothorax or pleural effusion.  The visualized skeletal structures are unremarkable. IMPRESSION: No active cardiopulmonary disease. Electronically Signed   By: Myles Rosenthal M.D.   On: 01/23/2018 18:26   Ct Head Wo Contrast  Result Date: 01/19/2018 CLINICAL DATA:  Ataxia and dizziness.  Hypertension. EXAM: CT HEAD WITHOUT CONTRAST TECHNIQUE: Contiguous axial images were obtained from the base of the skull through the vertex without intravenous contrast. COMPARISON:  None. FINDINGS: Brain: The ventricles are normal in size and configuration. There is no intracranial mass, hemorrhage, extra-axial fluid collection, or midline shift. There is slight small vessel disease in the centra semiovale bilaterally. Elsewhere gray-white compartments appear normal. No evident acute infarct. Vascular: There is a 1.0 x 0.9 x 0.7 cm aneurysm arising at the distal basilar artery extending marginally toward the left. There is no hyperdense vessel. There is calcification in each carotid siphon region as well as in the distal left vertebral artery. Skull: The bony calvarium appears intact. Sinuses/Orbits: There is mucosal thickening in several ethmoid air cells. Other visualized paranasal sinuses are clear. Orbits appear symmetric bilaterally.  Other: Mastoid air cells are clear. IMPRESSION: 1. 1.0 x 0.9 x 0.7 cm aneurysm at the distal aspect of the basilar artery extending slightly toward the left. Advise correlation with CT angiography to further assess. An aneurysm of this size has an increased risk of rupture. 2. Slight periventricular small vessel disease. No acute infarct or hemorrhage. No mass. No extra-axial fluid. 3.  Foci of arterial vascular calcification. 4.  Mucosal thickening in several ethmoid air cells. These results were called by telephone at the time of interpretation on 01/19/2018 at 12:58 pm to Dr. Derwood Kaplan , who verbally acknowledged these results. Electronically Signed   By: Bretta Bang III M.D.   On: 01/19/2018 13:02   Ct Angio Neck  W And/or Wo Contrast  Result Date: 01/19/2018 CLINICAL DATA:  Acute presentation with dizziness beginning today. 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 EXAM: CT ANGIOGRAPHY HEAD AND NECK TECHNIQUE: Multidetector CT imaging of the head and neck was performed using the standard protocol during bolus administration of intravenous contrast. Multiplanar CT image reconstructions and MIPs were obtained to evaluate the vascular anatomy. Carotid stenosis measurements (when applicable) are obtained utilizing NASCET criteria, using the distal internal carotid diameter as the denominator. CONTRAST:  75mL ISOVUE-370 IOPAMIDOL (ISOVUE-370) INJECTION 76% COMPARISON:  CT same day FINDINGS: CTA NECK FINDINGS Aortic arch: Pronounced aortic atherosclerosis. No aneurysm or dissection. Branching pattern of the brachiocephalic vessels from the arch is normal. No origin stenosis. Ectasia of the proximal subclavian artery. Right carotid system: Common carotid artery shows atherosclerotic plaque but is widely patent to the bifurcation. Soft and calcified plaque at the carotid bifurcation and ICA bulb. Minimal diameter of the proximal ICA is 5 mm, this same as the more distal cervical ICA. Therefore there is no stenosis. 70% stenosis of the proximal ECA. Marked tortuosity of the cervical ICA. In the region of maximal tortuosity, at the C3 level, there is irregularity of the ICA consistent with fibromuscular disease. There is a pseudo aneurysm in this location measuring 6 mm in diameter. Left carotid system: Common carotid artery shows some non stenotic plaque and is widely patent to the bifurcation region. Calcified plaque at the carotid bifurcation and ICA bulb. Minimal diameter in the ICA bulb is 4 mm. Compared to a more distal cervical ICA diameter of 5 mm, this indicates a 20% stenosis. 70% stenosis of the proximal ECA. Cervical internal carotid artery is markedly tortuous on this side as well. No pseudo aneurysm seen on this side.  Beneath the skull base, the vessel is ectatic measuring up to 7 mm in diameter. Vertebral arteries: Right vertebral artery origin is widely patent. 30% stenosis of the left vertebral artery origin. Both proximal vertebral arteries are markedly tortuous. Beyond that, the vertebral arteries show some scattered atherosclerotic plaque but are widely patent through the cervical region to the foramen magnum. Skeleton: Mid cervical spondylosis with prominent osteophytes at C5-6 and C6-7. Other neck: No mass or lymphadenopathy. Upper chest: Emphysema and pulmonary scarring.  No active process. Review of the MIP images confirms the above findings CTA HEAD FINDINGS Anterior circulation: Both internal carotid arteries are patent through the skull base and siphon regions. There is peripheral atherosclerotic calcification in the carotid siphon regions but no stenosis greater than 30%. Supraclinoid internal carotid arteries are widely patent. The anterior and middle cerebral vessels are widely patent. No stenosis, aneurysm or vascular malformation. Posterior circulation: Both vertebral arteries are patent at the foramen magnum level. There is atherosclerotic plaque of the V4  segment on the left with 30% stenosis. Both vertebral arteries are patent to the basilar. No basilar stenosis. There is a basilar tip aneurysm measuring 9 mm in diameter with a wide mouth. The aneurysm projects slightly more towards the left. Diameter of the mouth is 4 mm. The aneurysm originates between the left superior cerebellar and left posterior cerebral arteries. Venous sinuses: Patent and normal. Anatomic variants: None significant Delayed phase: No abnormal enhancement. 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  Advanced aortic atherosclerosis. Atherosclerotic change at both carotid bifurcations. No stenosis of the right ICA. 20% stenosis of the left ICA. Both cervical internal carotid arteries are markedly tortuous. There is probably bilateral fibromuscular change, more pronounced on the right. 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 Electronically Signed   By: Paulina Fusi M.D.   On: 01/19/2018 13:54   Mr Laqueta Jean ZO Contrast  Result Date: 01/21/2018 CLINICAL DATA:  Generalized muscle weakness. Unsteady gait. History of hypertension, hyperlipidemia, aneurysm. EXAM: MRI HEAD WITHOUT AND WITH CONTRAST TECHNIQUE: Multiplanar, multiecho pulse sequences of the brain and surrounding structures were obtained without and with intravenous contrast. CONTRAST:  20mL MULTIHANCE GADOBENATE DIMEGLUMINE 529 MG/ML IV SOLN COMPARISON:  CT/CTA HEAD and neck January 20, 2011 FINDINGS: INTRACRANIAL CONTENTS: No reduced diffusion to suggest acute ischemia. No susceptibility artifact to suggest hemorrhage. The ventricles and sulci are normal for patient's age. Patchy supratentorial white matter FLAIR T2 hyperintensities. Old small RIGHT cerebellar infarcts. No suspicious parenchymal signal, masses, mass effect. No abnormal intraparenchymal or extra-axial enhancement. No abnormal extra-axial fluid collections. No extra-axial masses. VASCULAR: Major intracranial vascular flow voids present at skull base. Bilobed 9 mm basilar tip aneurysm better characterized on prior imaging. SKULL AND UPPER CERVICAL SPINE: No abnormal sellar expansion. No suspicious calvarial bone marrow signal. Craniocervical junction maintained. SINUSES/ORBITS: The paranasal sinuses are well aerated. Trace mastoid effusions.The included ocular globes and orbital contents are non-suspicious. Status post bilateral ocular lens implants. OTHER: None. IMPRESSION: 1. No acute intracranial process. 2. 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 3. Redemonstration of basilar tip aneurysm better characterized on recent CTA HEAD. Neuro-Interventional Radiology consultation is suggested to evaluate the appropriateness of potential treatment. Non-emergent evaluation can be arranged by calling [phone] during usual hours. Emergency evaluation can be requested by paging [phone]. Electronically Signed   By: Awilda Metro M.D.   On: 01/21/2018 13:59   Dg Chest Portable 1 View  Result Date: 01/19/2018 CLINICAL DATA:  67 year old female with chest pain and bradycardia. Pain radiating to the left arm with numbness in the jaw. EXAM: PORTABLE CHEST 1 VIEW COMPARISON:  None available. FINDINGS: Portable AP upright view at 1148 hours. Cardiomegaly. Prior median sternotomy and CABG. Bilateral increased pulmonary interstitial markings suggesting vascular congestion. No pneumothorax, pleural effusion or consolidation. No acute osseous abnormality identified. IMPRESSION: No prior study. Acute pulmonary interstitial edema suspected. Cardiomegaly and prior CABG. Electronically Signed   By: Odessa Fleming M.D.   On: 01/19/2018 12:04   Ct Angio Chest/abd/pel For Dissection W And/or W/wo  Result Date: 01/21/2018 CLINICAL DATA:  67 year old female with abdominal aortic aneurysm and iliac artery stenosis. EXAM: CT ANGIOGRAPHY CHEST, ABDOMEN AND PELVIS TECHNIQUE: Multidetector CT imaging through the chest,  abdomen and pelvis was performed using the standard protocol during bolus administration of intravenous contrast. Multiplanar reconstructed images and MIPs were obtained and reviewed to evaluate the vascular anatomy. CONTRAST:  ISOVUE-370 IOPAMIDOL (ISOVUE-370) INJECTION 76% COMPARISON:  None. FINDINGS: CTA CHEST FINDINGS Cardiovascular: Conventional 3 vessel arch anatomy. Scattered heterogeneous and irregular atherosclerotic plaque throughout the transverse and descending thoracic aorta. The ascending and transverse thoracic aorta  remain normal in size. The distal descending thoracic aorta is mildly aneurysmal at 4.0 cm in diameter just proximal to the aortic hiatus. The aortic root is normal in size. Calcifications are present throughout the coronary arteries including the left main, left anterior descending, circumflex and right coronary artery. Patient is status post median sternotomy with evidence of prior multivessel CABG. Mild cardiomegaly with right atrial dilatation. No pericardial effusion. Normal caliber pulmonary artery. No evidence of pulmonary embolus. Mediastinum/Nodes: Unremarkable CT appearance of the thyroid gland. No suspicious mediastinal or hilar adenopathy. No soft tissue mediastinal mass. The thoracic esophagus is unremarkable. Lungs/Pleura: Mild respiratory motion artifact. Dependent subsegmental atelectasis. Mild centrilobular pulmonary emphysema. No suspicious mass or nodule. Musculoskeletal: No acute fracture or aggressive appearing lytic or blastic osseous lesion. Review of the MIP images confirms the above findings. CTA ABDOMEN AND PELVIS FINDINGS VASCULAR Aorta: Heterogeneous atherosclerotic plaque extends throughout the abdominal aorta. Fusiform aneurysmal dilatation of the infrarenal abdominal aorta with a maximal diameter of 3.6 cm (please see coronal reformatted images secondary to underlying tortuosity). No evidence of dissection. Celiac: Patent without evidence of aneurysm, dissection, vasculitis or significant stenosis. SMA: Patent without evidence of aneurysm, dissection, vasculitis or significant stenosis. Renals: There are 2 renal arteries bilaterally. The lowest renal artery is the second left renal artery. Heterogeneous atherosclerotic plaque at the origin of the renal arteries resulting in a focal moderate to high-grade stenosis of the origin of the more inferior right renal artery, mild stenosis of the origin of the more superior right renal artery. No definite stenosis at the origins of either left  renal artery. IMA: Patent without evidence of aneurysm, dissection, vasculitis or significant stenosis. Inflow: Heterogeneous atherosclerotic plaque results in a focal high-grade stenosis of the right common iliac artery. The right internal and external iliac arteries are diseased but widely patent. The left common iliac artery demonstrates heterogeneous but largely calcified atherosclerotic plaque but no focal stenosis. Probable chronic occlusion of the left internal iliac artery. The left external iliac artery is relatively spared from disease. Veins: No focal venous abnormality.  Retroaortic left renal vein. Review of the MIP images confirms the above findings. NON-VASCULAR Hepatobiliary: Normal hepatic contour and morphology. No discrete hepatic lesions. Normal appearance of the gallbladder. No intra or extrahepatic biliary ductal dilatation. Pancreas: Unremarkable. No pancreatic ductal dilatation or surrounding inflammatory changes. Spleen: Normal in size without focal abnormality. Adrenals/Urinary Tract: Mild adreniform thickening of the left adrenal gland likely reflecting adrenal hyperplasia. The right adrenal gland is normal. No enhancing renal mass, hydronephrosis or nephrolithiasis. Focal cortical scarring in the upper pole of the right kidney. Unremarkable ureters and bladder. Stomach/Bowel: Scattered colonic diverticula without evidence of active diverticulitis. Normal appendix and terminal ileum. No focal bowel wall thickening or evidence of obstruction. Lymphatic: No suspicious lymphadenopathy. Reproductive: Status post hysterectomy. No adnexal masses. Other: No abdominal wall hernia or abnormality. No abdominopelvic ascites. Musculoskeletal: No acute or significant osseous findings. Focal L5-S1 degenerative disc disease. Review of the MIP images confirms the above findings. IMPRESSION: CTA CHEST 1. Mild aneurysmal dilatation of the distal descending thoracic aorta measuring up  to 4.0 cm at the aortic  hiatus. Aortic aneurysm NOS (ICD10-I71.9). 2.  Aortic Atherosclerosis (ICD10-170.0) 3. Coronary artery disease. 4.  Emphysema (ICD10-J43.9). 5. Cardiomegaly with right atrial dilatation. CTA ABD/PELVIS 1. 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 2. 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 Incidentally, the focal stenosis is within a region of focal vessel tortuosity. 3. 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 4. Focal mild stenosis of the more superior right renal artery. 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 6. Additional ancillary findings as above. Signed, Sterling BigHeath K. McCullough, MD [facility] Electronically Signed   By: Malachy MoanHeath  McCullough M.D.   On: 01/21/2018 08:37        Subjective: Still having dizziness.  Feels that symptoms are similar since onset several days ago, prior to her previous admission.  No new complaints.  Discharge Exam: Vitals:   01/24/18 0923 01/24/18 1307  BP: 139/61 130/65  Pulse: 60 68  Resp: 18 18  Temp:  97.8 F (36.6 C)  SpO2: 93% 90%   Vitals:   01/24/18 0210 01/24/18 0641 01/24/18 0923 01/24/18 1307  BP: 98/62 (!) 121/46 139/61 130/65  Pulse: 77 60 60 68  Resp: 18 18 18 18   Temp: 97.9 F (36.6 C) 98.3 F (36.8 C)  97.8 F (36.6 C)  TempSrc: Oral Oral  Oral  SpO2: 96% 96% 93% 90%  Weight:      Height:        General: Pt is alert, awake, not in acute distress Cardiovascular: RRR, S1/S2 +, no rubs, no gallops Respiratory: CTA bilaterally, no wheezing, no rhonchi Abdominal: Soft, NT, ND, bowel sounds + Extremities: no edema, no cyanosis    The results of significant diagnostics from this hospitalization (including imaging, microbiology, ancillary and laboratory) are listed below for  reference.     Microbiology: No results found for this or any previous visit (from the past 240 hour(s)).   Labs: BNP (last 3 results) No results for input(s): BNP in the last 8760 hours. Basic Metabolic Panel: Recent Labs  Lab 01/19/18 1122 01/20/18 0731 01/23/18 1747 01/23/18 2217  NA 139 137 140  --   K 3.9 3.7 3.7  --   CL 104 105 103  --   CO2 26 22 25   --   GLUCOSE 95 95 103*  --   BUN 18 15 20   --   CREATININE 0.71 0.72 0.75  --   CALCIUM 9.1 9.0 10.3  --   MG 1.9  --   --  2.0   Liver Function Tests: Recent Labs  Lab 01/23/18 1759  AST 31  ALT 27  ALKPHOS 59  BILITOT 0.6  PROT 7.4  ALBUMIN 4.3   No results for input(s): LIPASE, AMYLASE in the last 168 hours. No results for input(s): AMMONIA in the last 168 hours. CBC: Recent Labs  Lab 01/19/18 1122 01/20/18 0731 01/21/18 0007 01/23/18 1747  WBC 5.2 4.8 4.6 5.5  HGB 14.3 13.8 13.8 15.0  HCT 43.9 41.4 41.3 45.3  MCV 94.8 95.0 95.2 94.6  PLT 205 189 176 202   Cardiac Enzymes: Recent Labs  Lab 01/20/18 0206 01/20/18 0731 01/23/18 1747 01/24/18 0342 01/24/18 0904  TROPONINI <0.03 0.03* <0.03 <0.03 <0.03   BNP: Invalid input(s): POCBNP CBG: No results for input(s): GLUCAP in  the last 168 hours. D-Dimer No results for input(s): DDIMER in the last 72 hours. Hgb A1c No results for input(s): HGBA1C in the last 72 hours. Lipid Profile No results for input(s): CHOL, HDL, LDLCALC, TRIG, CHOLHDL, LDLDIRECT in the last 72 hours. Thyroid function studies No results for input(s): TSH, T4TOTAL, T3FREE, THYROIDAB in the last 72 hours.  Invalid input(s): FREET3 Anemia work up No results for input(s): VITAMINB12, FOLATE, FERRITIN, TIBC, IRON, RETICCTPCT in the last 72 hours. Urinalysis No results found for: COLORURINE, APPEARANCEUR, LABSPEC, PHURINE, GLUCOSEU, HGBUR, BILIRUBINUR, KETONESUR, PROTEINUR, UROBILINOGEN, NITRITE, LEUKOCYTESUR Sepsis Labs Invalid input(s): PROCALCITONIN,  WBC,   LACTICIDVEN Microbiology No results found for this or any previous visit (from the past 240 hour(s)).   Time coordinating discharge:  SIGNED:   Erick Blinks, MD  [facility] 01/24/2018, 6:09 PM Pager   If 7PM-7AM, please contact night-coverage [email or web address] Password [email or web address]

## 2018-01-24 NOTE — Plan of Care (Signed)
  Problem: Acute Rehab PT Goals(only PT should resolve) Goal: Patient Will Transfer Sit To/From Stand Outcome: Progressing Flowsheets (Taken 01/24/2018 1216) Patient will transfer sit to/from stand: with modified independence Goal: Pt Will Transfer Bed To Chair/Chair To Bed Outcome: Progressing Flowsheets (Taken 01/24/2018 1216) Pt will Transfer Bed to Chair/Chair to Bed: with modified independence Goal: Pt Will Ambulate Outcome: Progressing Flowsheets (Taken 01/24/2018 1216) Pt will Ambulate: with modified independence;75 feet;with cane;with standard walker   12:17 PM, 01/24/18 Heather Livingston Heather Livingston, MPT Physical Therapist with [facility] 336 [phone] office [phone] mobile phone

## 2018-01-24 NOTE — Care Management Note (Signed)
Case Management Note  Patient Details  Name: Heather Livingston MRN: [id number] Date of Birth: 07/30/1951  Subjective/Objective:     [place] and ordered [facility].  Bedside RN asked patient for choice of HH agencies and she had no preference with no history of HH.               Action/Plan: Becky Saxheryl Rose with [facility] contacted and [facility]  [facility].  Expected Discharge Date:  01/24/18               Expected Discharge Plan:  Home w Home Health Services  In-House Referral:  NA  Discharge planning Services  CM Consult  Post Acute Care Choice:  Home Health Choice offered to:  Patient  DME Arranged:  N/A DME Agency:  NA  HH Arranged:  PT HH Agency:  [facility]  Status of Service:  Completed, signed off  If discussed at Long Length of Stay Meetings, dates discussed:    Additional Comments:  [name]  [name], RN 01/24/2018, 4:20 PM

## 2018-01-26 ENCOUNTER — Other Ambulatory Visit: Payer: Self-pay

## 2018-01-26 ENCOUNTER — Telehealth: Payer: Self-pay | Admitting: Cardiology

## 2018-01-26 ENCOUNTER — Telehealth: Payer: Self-pay | Admitting: Surgery

## 2018-01-26 DIAGNOSIS — I6529 Occlusion and stenosis of unspecified carotid artery: Secondary | ICD-10-CM

## 2018-01-26 DIAGNOSIS — I739 Peripheral vascular disease, unspecified: Secondary | ICD-10-CM

## 2018-01-26 NOTE — Telephone Encounter (Signed)
Received note from [facility], [place] She will need 1 week basic metabolic panel to check her electrolyte and renal function.  She has been instructed to turn off the light and rest on the bed for the next 2 hours, if after taking extra blood pressure medication her systolic blood pressure is still over 200 in 2 hours, she has been instructed to go to the emergency room.  Otherwise she needs cardiology office visit within the next 5 days to help control her blood pressure.

## 2018-01-26 NOTE — Telephone Encounter (Signed)
-----   Message from Sharee Pimple, RN sent at 01/22/2018  9:22 AM EDT ----- Regarding: OV and 2 labs to discuss AAA repair   ----- Message ----- From: Nada Libman, MD Sent: 01/22/2018   7:58 AM To: Vvs Charge Pool  Please schedule patient to see me this month or early May for discussions of AAA repair.  She needs ABI's and carotid duplex prior.  Will need 30 min visit

## 2018-01-26 NOTE — Telephone Encounter (Signed)
Lab work has already been ordered and patient aware she needs to have done. Appointment scheduled with Dr. Diona BrownerMcDowell on 5/22 next available. Offered appointment in [place] or [place] to see a PA and patient refused. Patient stated she would rather wait to see Dr. Diona BrownerMcDowell.

## 2018-01-26 NOTE — Telephone Encounter (Signed)
Spoke to pt for OV an US x2 5/17 Mld lttr

## 2018-01-27 ENCOUNTER — Encounter: Payer: Self-pay | Admitting: Cardiology

## 2018-01-27 ENCOUNTER — Ambulatory Visit (INDEPENDENT_AMBULATORY_CARE_PROVIDER_SITE_OTHER): Payer: Medicare Other | Admitting: Cardiology

## 2018-01-27 ENCOUNTER — Other Ambulatory Visit: Payer: Self-pay | Admitting: *Deleted

## 2018-01-27 VITALS — BP 134/64 | HR 61 | Ht 65.5 in | Wt 210.0 lb

## 2018-01-27 DIAGNOSIS — I714 Abdominal aortic aneurysm, without rupture, unspecified: Secondary | ICD-10-CM

## 2018-01-27 DIAGNOSIS — I25119 Atherosclerotic heart disease of native coronary artery with unspecified angina pectoris: Secondary | ICD-10-CM | POA: Diagnosis not present

## 2018-01-27 DIAGNOSIS — I10 Essential (primary) hypertension: Secondary | ICD-10-CM

## 2018-01-27 DIAGNOSIS — Z8673 Personal history of transient ischemic attack (TIA), and cerebral infarction without residual deficits: Secondary | ICD-10-CM

## 2018-01-27 DIAGNOSIS — I200 Unstable angina: Secondary | ICD-10-CM | POA: Diagnosis not present

## 2018-01-27 MED ORDER — AMLODIPINE BESYLATE 10 MG PO TABS: 10.0000 mg | ORAL_TABLET | Freq: Every day | ORAL | Status: DC

## 2018-01-27 NOTE — Progress Notes (Signed)
Cardiology Office Note  Date: 01/27/2018   ID: Heather Livingston, DOB 11/21/1950, MRN [id number]  PCP: Theodoro KosLewis, William B, MD  Primary Cardiologist: Nona DellSamuel Kendryck Lacroix, MD   Chief Complaint  Patient presents with  . Coronary Artery Disease    History of Present Illness: Heather Livingston is a 67 y.o. female presenting for hospital follow-up.  I reviewed extensive records.  She was recently hospitalized with symptoms concerning for unstable angina, [facility]  She ruled out for ACS and underwent diagnostic cardiac catheterization which demonstrated patent LIMA to LAD, patent SVG to PDA, and known occlusion of the SVG to OM with only 50% circumflex stenosis.  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  Studies are outlined below.  She was then recently [facility] in the setting of hypertension and dizziness.  She presents today for follow-up.  We discussed the results of her recent testing and also pending consultations with Dr. Myra GianottiBrabham and Dr. Conchita ParisNundkumar.  She does not report any recent chest pain.  Still feels somewhat unsteady, no progressive headaches or visual changes.  No focal motor weakness.  Review her medications which have changed significantly since I last saw her.  Most recently Norvasc was increased to 10 mg daily and she was placed on low-dose Aldactone.  Her blood pressure today is better controlled.   Past Medical History:  Diagnosis Date  . Bradycardia   . Cataracts, bilateral   . Coronary atherosclerosis of native coronary artery    DES circ 10/10, residual 100% occluded SVG to OM, patent LIMA to LAD and SVG to PDA grafts, LVEF 50%  . GERD (gastroesophageal reflux disease)   . Hyperlipemia   . Hypertension   . MI (myocardial infarction) (HCC)   . OSA  (obstructive sleep apnea)    On CPAP  . Palpitations    Transient PAT/AF, CHADS2 score 1    Past Surgical History:  Procedure Laterality Date  . ABDOMINAL AORTOGRAM N/A 01/19/2018   Procedure: ABDOMINAL AORTOGRAM;  Surgeon: Corky CraftsVaranasi, Jayadeep S, MD;  [facility];  Service: Cardiovascular;  Laterality: N/A;  . ABDOMINAL HYSTERECTOMY    . CARPAL TUNNEL RELEASE    . CORONARY ARTERY BYPASS GRAFT     [facility], LIMA to LAD, SVG to OM, SVG to PDA  . LEFT HEART CATH AND CORS/GRAFTS ANGIOGRAPHY N/A 01/19/2018   Procedure: LEFT HEART CATH AND CORS/GRAFTS ANGIOGRAPHY;  Surgeon: Corky CraftsVaranasi, Jayadeep S, MD;  [facility];  Service: Cardiovascular;  Laterality: N/A;  . LEFT HEART CATHETERIZATION WITH CORONARY/GRAFT ANGIOGRAM N/A 12/27/2012   Procedure: LEFT HEART CATHETERIZATION WITH Isabel CapriceORONARY/GRAFT ANGIOGRAM;  Surgeon: Herby Abrahamhomas D Stuckey, MD;  [facility];  Service: Cardiovascular;  Laterality: N/A;    Current Outpatient Medications  Medication Sig Dispense Refill  . albuterol (VENTOLIN HFA) 108 (90 Base) MCG/ACT inhaler Inhale 1-2 puffs into the lungs every 6 (six) hours as needed for shortness of breath.     Marland Kitchen. aspirin EC 81 MG tablet Take 1 tablet (81 mg total) by mouth daily.    . clopidogrel (PLAVIX) 75 MG tablet Take 1 tablet (75 mg total) by mouth daily. 90 tablet 1  . hydrALAZINE (APRESOLINE) 100 MG tablet Take 1 tablet (100 mg total) by mouth 3 (three) times daily. 90 tablet 1  . hydrochlorothiazide (HYDRODIURIL)  25 MG tablet TAKE 1 TABLET BY MOUTH EVERY DAY 90 tablet 3  . HYDROcodone-acetaminophen (NORCO) 7.5-325 MG tablet Take 1 tablet by mouth every 6 (six) hours as needed for moderate pain.    . isosorbide mononitrate (IMDUR) 60 MG 24 hr tablet Take 1 tablet (60 mg total) by mouth daily. 90 tablet 1  . losartan (COZAAR) 100 MG tablet Take 1 tablet (100 mg total) by mouth daily. 90 tablet 1  . magnesium oxide (MAG-OX) 400 (241.3 Mg) MG tablet Take 1  tablet by mouth daily.  3  . meclizine (ANTIVERT) 25 MG tablet Take 1 tablet (25 mg total) by mouth 3 (three) times daily. 30 tablet 0  . metoprolol tartrate (LOPRESSOR) 25 MG tablet Take 1 tablet (25 mg total) by mouth 2 (two) times daily. 60 tablet 1  . nitroGLYCERIN (NITROSTAT) 0.4 MG SL tablet Place 1 tablet (0.4 mg total) under the tongue every 5 (five) minutes x 3 doses as needed for chest pain. 25 tablet 3  . ranitidine (ZANTAC) 300 MG tablet Take 300 mg by mouth at bedtime.    . simvastatin (ZOCOR) 40 MG tablet TAKE 1 TABLET BY MOUTH EVERYDAY AT BEDTIME 90 tablet 1  . spironolactone (ALDACTONE) 25 MG tablet Take 0.5 tablets (12.5 mg total) by mouth daily. 30 tablet 11  . amLODipine (NORVASC) 10 MG tablet Take 1 tablet (10 mg total) by mouth daily.     No current facility-administered medications for this visit.    Allergies:  Patient has no known allergies.   Social History: The patient  reports that she quit smoking about 10 years ago. Her smoking use included cigarettes. She started smoking about 52 years ago. She has a 32.25 pack-year smoking history. She has never used smokeless tobacco. She reports that she drinks alcohol. She reports that she does not use drugs.   ROS:  Please see the history of present illness. Otherwise, complete review of systems is positive for none.  All other systems are reviewed and negative.   Physical Exam: VS:  BP 134/64   Pulse 61   Ht 5' 5.5" (1.664 m)   Wt 210 lb (95.3 kg)   SpO2 97%   BMI 34.41 kg/m , BMI Body mass index is 34.41 kg/m.  Wt Readings from Last 3 Encounters:  01/27/18 210 lb (95.3 kg)  01/23/18 187 lb (84.8 kg)  01/21/18 217 lb 11.2 oz (98.7 kg)    General: Patient appears comfortable at rest. HEENT: Conjunctiva and lids normal, oropharynx clear. Neck: Supple, no elevated JVP or carotid bruits, no thyromegaly. Lungs: Clear to auscultation, nonlabored breathing at rest. Cardiac: Regular rate and rhythm, no S3, soft systolic  murmur. Abdomen: Soft, nontender, bowel sounds present. Extremities: No pitting edema, distal pulses 2+. Skin: Warm and dry. Musculoskeletal: No kyphosis. Neuropsychiatric: Alert and oriented x3, affect grossly appropriate.  ECG: I personally reviewed the tracing from 01/23/2018 which showed sinus rhythm with probable old inferior infarct pattern and nonspecific ST-T changes.  Recent Labwork: 01/23/2018: ALT 27; AST 31; BUN 20; Creatinine, Ser 0.75; Hemoglobin 15.0; Magnesium 2.0; Platelets 202; Potassium 3.7; Sodium 140     Component Value Date/Time   CHOL 85 01/20/2018 0206   TRIG 162 (H) 01/20/2018 0206   HDL 25 (L) 01/20/2018 0206   CHOLHDL 3.4 01/20/2018 0206   VLDL 32 01/20/2018 0206   LDLCALC 28 01/20/2018 0206    Other Studies Reviewed Today:  Cardiac catheterization 01/19/2018:  Prox RCA lesion is 100% stenosed. SVG to  PDA is patent.  Prox LAD lesion is 100% stenosed. LIMA to LAD is patent.  Mid LM to Dist LM lesion is 25% stenosed.  Mid Cx lesion is 50% stenosed. SVG to OM is occluded, known from prior.  The left ventricular systolic function is normal.  LV end diastolic pressure is mildly elevated.  The left ventricular ejection fraction is 50-55% by visual estimate.  There is no aortic valve stenosis.  Infrarenal AAA. PAD including right common iliac artery stenosis.  Bilateral common iliac arteries appear ectatic.   Medical therapy for CAD.  Stable from prior cath. No acute lesion detected.  Brain MRI 01/21/2018: IMPRESSION: 1. No acute intracranial process. 2. 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 ischemic disease. 3. Redemonstration of basilar tip aneurysm better characterized on recent CTA HEAD. Neuro-Interventional Radiology consultation is suggested to evaluate the appropriateness of potential treatment.  CTA 01/20/2018: IMPRESSION: CTA CHEST  1. Mild aneurysmal dilatation of the distal descending thoracic aorta  measuring up to 4.0 cm at the aortic hiatus. Aortic aneurysm NOS (ICD10-I71.9). 2.  Aortic Atherosclerosis (ICD10-170.0) 3. Coronary artery disease. 4.  Emphysema (ICD10-J43.9). 5. Cardiomegaly with right atrial dilatation.  CTA ABD/PELVIS  1. 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 2. 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 Incidentally, the focal stenosis is within a region of focal vessel tortuosity. 3. 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 4. Focal mild stenosis of the more superior right renal artery. 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 6. Additional ancillary findings as above.  Assessment and Plan:  1.  Multivessel CAD status post CABG and previous DES to the circumflex.  Recent cardiac catheterization demonstrated patent LIMA to LAD and patent SVG to PDA with known occlusion of the SVG to OM otherwise with moderate residual circumflex disease and plan for medical therapy.  No specific culprit for PCI in the setting of angina symptoms.  She ruled out for ACS.  She continues on aspirin and Plavix.  Also on beta-blocker, long-acting nitrate, and statin.  2.  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  She has further workup pending with Dr. Myra Gianotti as it relates to elective repair.  From a cardiac perspective, I would anticipate that she should be able to proceed with vascular surgery at overall intermediate risk.  3.  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  Asymptomatic.  4.  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  She has pending neurosurgical consultation with Dr. Conchita Paris.  5.  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  No obvious recent  events.  She is on dual antiplatelet therapy and statin.  No history of atrial arrhythmias.  6.  Essential hypertension.  Medication adjustments have been made recently and her blood pressure today is better controlled.  Continue to observe.  Current medicines were reviewed with the patient today.  Disposition: Follow-up in 6 weeks.  Signed, Jonelle Sidle, MD, [facility] 01/27/2018 2:14 PM    [facility] [address] [place], [place], [place] [address] Phone: [phone]; Fax: [phone]

## 2018-01-27 NOTE — Patient Instructions (Signed)
Medication Instructions:  Continue all current medications.  Labwork: none  Testing/Procedures: none  Follow-Up: 6 weeks   Any Other Special Instructions Will Be Listed Below (If Applicable).  If you need a refill on your cardiac medications before your next appointment, please call your pharmacy.  

## 2018-02-16 ENCOUNTER — Other Ambulatory Visit: Payer: Self-pay | Admitting: Neurosurgery

## 2018-02-16 DIAGNOSIS — I671 Cerebral aneurysm, nonruptured: Secondary | ICD-10-CM

## 2018-02-23 ENCOUNTER — Encounter (HOSPITAL_COMMUNITY): Payer: Self-pay

## 2018-02-23 ENCOUNTER — Ambulatory Visit (HOSPITAL_COMMUNITY)
Admission: RE | Admit: 2018-02-23 | Discharge: 2018-02-23 | Disposition: A | Discharge status: Home / Self Care | Payer: Medicare Other | Source: Ambulatory Visit | Attending: Neurosurgery | Admitting: Neurosurgery

## 2018-02-23 ENCOUNTER — Other Ambulatory Visit: Payer: Self-pay | Admitting: Neurosurgery

## 2018-02-23 DIAGNOSIS — I252 Old myocardial infarction: Secondary | ICD-10-CM | POA: Insufficient documentation

## 2018-02-23 DIAGNOSIS — I714 Abdominal aortic aneurysm, without rupture: Secondary | ICD-10-CM | POA: Insufficient documentation

## 2018-02-23 DIAGNOSIS — I25810 Atherosclerosis of coronary artery bypass graft(s) without angina pectoris: Secondary | ICD-10-CM | POA: Insufficient documentation

## 2018-02-23 DIAGNOSIS — I2582 Chronic total occlusion of coronary artery: Secondary | ICD-10-CM | POA: Insufficient documentation

## 2018-02-23 DIAGNOSIS — Z955 Presence of coronary angioplasty implant and graft: Secondary | ICD-10-CM | POA: Insufficient documentation

## 2018-02-23 DIAGNOSIS — Z7982 Long term (current) use of aspirin: Secondary | ICD-10-CM | POA: Insufficient documentation

## 2018-02-23 DIAGNOSIS — I671 Cerebral aneurysm, nonruptured: Secondary | ICD-10-CM | POA: Insufficient documentation

## 2018-02-23 DIAGNOSIS — G4733 Obstructive sleep apnea (adult) (pediatric): Secondary | ICD-10-CM | POA: Insufficient documentation

## 2018-02-23 DIAGNOSIS — Z87891 Personal history of nicotine dependence: Secondary | ICD-10-CM | POA: Insufficient documentation

## 2018-02-23 DIAGNOSIS — E785 Hyperlipidemia, unspecified: Secondary | ICD-10-CM | POA: Insufficient documentation

## 2018-02-23 DIAGNOSIS — Z7902 Long term (current) use of antithrombotics/antiplatelets: Secondary | ICD-10-CM | POA: Diagnosis not present

## 2018-02-23 DIAGNOSIS — K219 Gastro-esophageal reflux disease without esophagitis: Secondary | ICD-10-CM | POA: Insufficient documentation

## 2018-02-23 HISTORY — PX: IR ANGIO VERTEBRAL SEL VERTEBRAL BILAT MOD SED: IMG5369

## 2018-02-23 HISTORY — PX: IR ANGIO INTRA EXTRACRAN SEL INTERNAL CAROTID BILAT MOD SED: IMG5363

## 2018-02-23 LAB — BASIC METABOLIC PANEL
ANION GAP: 8 (ref 5–15)
BUN: 16 mg/dL (ref 6–20)
CHLORIDE: 105 mmol/L (ref 101–111)
CO2: 28 mmol/L (ref 22–32)
Calcium: 9.6 mg/dL (ref 8.9–10.3)
Creatinine, Ser: 0.74 mg/dL (ref 0.44–1.00)
GFR calc non Af Amer: >60 mL/min (ref >60)
Glucose, Bld: 105 mg/dL — ABNORMAL HIGH (ref 65–99)
Potassium: 3.9 mmol/L (ref 3.5–5.1)
Sodium: 141 mmol/L (ref 135–145)

## 2018-02-23 LAB — CBC WITH DIFFERENTIAL/PLATELET
Abs Immature Granulocytes: 0 10*3/uL (ref 0.0–0.1)
Basophils Absolute: 0.1 10*3/uL (ref 0.0–0.1)
Basophils Relative: 1 %
EOS ABS: 0.2 10*3/uL (ref 0.0–0.7)
EOS PCT: 3 %
HEMATOCRIT: 42 % (ref 36.0–46.0)
HEMOGLOBIN: 14.1 g/dL (ref 12.0–15.0)
Immature Granulocytes: 0 %
LYMPHS ABS: 1.6 10*3/uL (ref 0.7–4.0)
LYMPHS PCT: 34 %
MCH: 31.5 pg (ref 26.0–34.0)
MCHC: 33.6 g/dL (ref 30.0–36.0)
MCV: 93.8 fL (ref 78.0–100.0)
MONO ABS: 0.5 10*3/uL (ref 0.1–1.0)
Monocytes Relative: 11 %
NEUTROS PCT: 51 %
Neutro Abs: 2.4 10*3/uL (ref 1.7–7.7)
Platelets: 192 10*3/uL (ref 150–400)
RBC: 4.48 MIL/uL (ref 3.87–5.11)
RDW: 12.9 % (ref 11.5–15.5)
WBC: 4.7 10*3/uL (ref 4.0–10.5)

## 2018-02-23 LAB — PROTIME-INR
INR: 0.95
Prothrombin Time: 12.6 seconds (ref 11.4–15.2)

## 2018-02-23 LAB — APTT: APTT: 32 s (ref 24–36)

## 2018-02-23 MED ORDER — IOPAMIDOL (ISOVUE-300) INJECTION 61%
INTRAVENOUS | Status: AC
  Administered 2018-02-23: 10 mL
  Filled 2018-02-23: qty 50

## 2018-02-23 MED ORDER — IOPAMIDOL (ISOVUE-300) INJECTION 61%
INTRAVENOUS | Status: AC
  Administered 2018-02-23: 50 mL
  Filled 2018-02-23: qty 100

## 2018-02-23 MED ORDER — LIDOCAINE HCL 1 % IJ SOLN
INTRAMUSCULAR | Status: AC
  Filled 2018-02-23: qty 20

## 2018-02-23 MED ORDER — FENTANYL CITRATE (PF) 100 MCG/2ML IJ SOLN
INTRAMUSCULAR | Status: AC
  Filled 2018-02-23: qty 2

## 2018-02-23 MED ORDER — MIDAZOLAM HCL 2 MG/2ML IJ SOLN
INTRAMUSCULAR | Status: AC
  Filled 2018-02-23: qty 2

## 2018-02-23 MED ORDER — CHLORHEXIDINE GLUCONATE CLOTH 2 % EX PADS: 6.0000 | MEDICATED_PAD | Freq: Once | CUTANEOUS | Status: DC

## 2018-02-23 MED ORDER — MIDAZOLAM HCL 2 MG/2ML IJ SOLN
INTRAMUSCULAR | Status: AC | PRN
  Administered 2018-02-23: 1 mg via INTRAVENOUS

## 2018-02-23 MED ORDER — SODIUM CHLORIDE 0.9 % IV SOLN: INTRAVENOUS | Status: DC

## 2018-02-23 MED ORDER — HYDROCODONE-ACETAMINOPHEN 5-325 MG PO TABS: 1.0000 | ORAL_TABLET | ORAL | Status: DC | PRN

## 2018-02-23 MED ORDER — FENTANYL CITRATE (PF) 100 MCG/2ML IJ SOLN
INTRAMUSCULAR | Status: AC | PRN
  Administered 2018-02-23: 25 ug via INTRAVENOUS

## 2018-02-23 MED ORDER — HEPARIN SODIUM (PORCINE) 1000 UNIT/ML IJ SOLN
INTRAMUSCULAR | Status: AC
  Filled 2018-02-23: qty 1

## 2018-02-23 MED ORDER — LIDOCAINE HCL 1 % IJ SOLN
INTRAMUSCULAR | Status: AC | PRN
  Administered 2018-02-23 (×2): 10 mL

## 2018-02-23 MED ORDER — HEPARIN SODIUM (PORCINE) 1000 UNIT/ML IJ SOLN
INTRAMUSCULAR | Status: AC | PRN
  Administered 2018-02-23: 2000 [IU] via INTRAVENOUS

## 2018-02-23 MED ORDER — CEFAZOLIN SODIUM-DEXTROSE 2-4 GM/100ML-% IV SOLN: 2.0000 g | INTRAVENOUS | Status: DC

## 2018-02-23 NOTE — Sedation Documentation (Signed)
Dr. Conchita Paris informed of patient seeing "light spots" in eyes. Patient able to moves all extremities without difficulty and A/O x 4. Patient transferred to short stay room 13 and reports that spots in eyes are easing off and not getting any worse. Patient also states that light spots in her eyes has occurred in past. Receiving nurse [facility] informed.

## 2018-02-23 NOTE — H&P (Signed)
Chief Complaint   Aneurysm  History of Present Illness  Heather Livingston is a 67 year old woman I am seeing for initial consultation after recent visit to the emergency department. She says a few weeks ago she was on her way to [employer] when she began to feel very strange in the head, although she says it was not really dizziness. She also felt some strange sensation in her neck which she thought was very similar to when she had previously had a heart attack. She therefore went to her cardiologist where she was found to have a significantly elevated blood pressure and was instructed to go immediately to the ED where she underwent further workup including CT of the brain, CT angiogram of the head, and MRI of the brain. She also underwent cardiac catheterization. She says that was when she was told she had a brain aneurysm. The cardiac catheterization is reportedly okay. She was also found to have what she says is an abdominal aneurysm, as well as "blockage's in the arteries in her legs."   Of note, she does have a significant cardiac history with previous quadruple bypass as well as cardiac stents. She is currently on 81 milligrams of aspirin and 75 milligrams of Plavix daily. She has a strong family history of both intracranial and abdominal aortic aneurysms. She does have a longstanding history of difficult-to-control hypertension. She does not smoke.   Past Medical History   Past Medical History:  Diagnosis Date  . Bradycardia   . Cataracts, bilateral   . Coronary atherosclerosis of native coronary artery    DES circ 10/10, residual 100% occluded SVG to OM, patent LIMA to LAD and SVG to PDA grafts, LVEF 50%  . GERD (gastroesophageal reflux disease)   . Hyperlipemia   . Hypertension   . MI (myocardial infarction) (HCC)   . OSA (obstructive sleep apnea)    On CPAP  . Palpitations    Transient PAT/AF, CHADS2 score 1    Past Surgical History   Past Surgical History:  Procedure Laterality  Date  . ABDOMINAL AORTOGRAM N/A 01/19/2018   Procedure: ABDOMINAL AORTOGRAM;  Surgeon: Corky Crafts, MD;  Location: [facility] INVASIVE CV LAB;  Service: Cardiovascular;  Laterality: N/A;  . ABDOMINAL HYSTERECTOMY    . CARPAL TUNNEL RELEASE    . CORONARY ARTERY BYPASS GRAFT     [facility], LIMA to LAD, SVG to OM, SVG to PDA  . LEFT HEART CATH AND CORS/GRAFTS ANGIOGRAPHY N/A 01/19/2018   Procedure: LEFT HEART CATH AND CORS/GRAFTS ANGIOGRAPHY;  Surgeon: Corky Crafts, MD;  Location: [facility] INVASIVE CV LAB;  Service: Cardiovascular;  Laterality: N/A;  . LEFT HEART CATHETERIZATION WITH CORONARY/GRAFT ANGIOGRAM N/A 12/27/2012   Procedure: LEFT HEART CATHETERIZATION WITH Isabel Caprice;  Surgeon: Herby Abraham, MD;  Location: [facility] CATH LAB;  Service: Cardiovascular;  Laterality: N/A;    Social History   Social History   Tobacco Use  . Smoking status: Former Smoker    Packs/day: 0.75    Years: 43.00    Pack years: 32.25    Types: Cigarettes    Start date: 09/13/1965    Last attempt to quit: 10/14/2007    Years since quitting: 10.3  . Smokeless tobacco: Never Used  Substance Use Topics  . Alcohol use: Yes    Alcohol/week: 0.0 oz    Comment: Occasional wine or beer  . Drug use: No    Medications   Prior to Admission medications   Medication Sig Start Date End Date  Taking? Authorizing Provider  albuterol (VENTOLIN HFA) 108 (90 Base) MCG/ACT inhaler Inhale 1-2 puffs into the lungs every 6 (six) hours as needed for shortness of breath.  12/19/16  Yes [provider]  amLODipine (NORVASC) 10 MG tablet Take 1 tablet (10 mg total) by mouth daily. 01/27/18  Yes Jonelle Sidle, MD  aspirin EC 81 MG tablet Take 1 tablet (81 mg total) by mouth daily. 01/14/13  Yes Serpe, Clide Deutscher, PA-C  clopidogrel (PLAVIX) 75 MG tablet Take 1 tablet (75 mg total) by mouth daily. 11/23/17  Yes Jonelle Sidle, MD  hydrALAZINE (APRESOLINE) 100 MG tablet Take 1 tablet (100 mg total) by mouth 3  (three) times daily. 01/21/18  Yes Laverda Page B, NP  hydrochlorothiazide (HYDRODIURIL) 25 MG tablet TAKE 1 TABLET BY MOUTH EVERY DAY 04/02/17  Yes Jonelle Sidle, MD  HYDROcodone-acetaminophen (NORCO) 7.5-325 MG tablet Take 1 tablet by mouth every 6 (six) hours as needed for moderate pain.   Yes [provider]  isosorbide mononitrate (IMDUR) 60 MG 24 hr tablet Take 1 tablet (60 mg total) by mouth daily. 11/23/17  Yes Jonelle Sidle, MD  losartan (COZAAR) 100 MG tablet Take 1 tablet (100 mg total) by mouth daily. 11/23/17  Yes Jonelle Sidle, MD  magnesium oxide (MAG-OX) 400 (241.3 Mg) MG tablet Take 1 tablet by mouth daily. 06/03/16  Yes [provider]  meclizine (ANTIVERT) 25 MG tablet Take 1 tablet (25 mg total) by mouth 3 (three) times daily. 01/24/18  Yes Erick Blinks, MD  metoprolol tartrate (LOPRESSOR) 25 MG tablet Take 1 tablet (25 mg total) by mouth 2 (two) times daily. 01/21/18  Yes Arty Baumgartner, NP  ranitidine (ZANTAC) 300 MG tablet Take 300 mg by mouth at bedtime.   Yes [provider]  simvastatin (ZOCOR) 40 MG tablet TAKE 1 TABLET BY MOUTH EVERYDAY AT BEDTIME 11/23/17  Yes Jonelle Sidle, MD  spironolactone (ALDACTONE) 25 MG tablet Take 0.5 tablets (12.5 mg total) by mouth daily. 01/23/18 01/23/19 Yes Azalee Course, PA  nitroGLYCERIN (NITROSTAT) 0.4 MG SL tablet Place 1 tablet (0.4 mg total) under the tongue every 5 (five) minutes x 3 doses as needed for chest pain. 02/05/16   Jonelle Sidle, MD    Allergies  No Known Allergies  Review of Systems  ROS  Neurologic Exam  Awake, alert, oriented Memory and concentration grossly intact Speech fluent, appropriate CN grossly intact Motor exam: Upper Extremities Deltoid Bicep Tricep Grip  Right 5/5 5/5 5/5 5/5  Left 5/5 5/5 5/5 5/5   Lower Extremities IP Quad PF DF EHL  Right 5/5 5/5 5/5 5/5 5/5  Left 5/5 5/5 5/5 5/5 5/5   Sensation grossly intact to LT  Imaging  CT angiogram  of the brain dated 01/19/2018 was reviewed. This demonstrates an approximately 9 millimeter left-sided basilar aneurysm which appears to arise between the left P1 and superior cerebellar arteries. 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 The vertebral arteries appear to be codominant with scattered calcific atherosclerotic disease although no significant stenosis    Impression  67 year old woman with essentially incidentally discovered 9 millimeter basilar aneurysm with significant vascular risk factors. In this patient I do certainly think that further workup with the intent of treatment is reasonable given the relatively high yearly risk of rupture. She also has concomitant diagnosis of abdominal aortic aneurysm  and stenosis of the right common iliac artery.    Plan   - we will plan on proceeding with diagnostic cerebral angiogram for further characterization of the basilar aneurysm

## 2018-02-23 NOTE — Sedation Documentation (Signed)
5FR exoseal closure device used. Manual pressure being held at right groin site.

## 2018-02-23 NOTE — Discharge Instructions (Signed)
Cerebral Angiogram, Care After °Refer to this sheet in the next few weeks. These instructions provide you with information on caring for yourself after your procedure. Your health care provider may also give you more specific instructions. Your treatment has been planned according to current medical practices, but problems sometimes occur. Call your health care provider if you have any problems or questions after your procedure. °What can I expect after the procedure? °After your procedure, it is typical to have the following: °· Bruising at the catheter insertion site that usually fades within 1-2 weeks. °· Blood collecting in the tissue (hematoma) that may be painful to the touch. It should usually decrease in size and tenderness within 1-2 weeks. °· A mild headache. ° °Follow these instructions at home: °· Take medicines only as directed by your health care provider. °· You may shower 24-48 hours after the procedure or as directed by your health care provider. Remove the bandage (dressing) and gently wash the site with plain soap and water. Pat the area dry with a clean towel. Do not rub the site, because this may cause bleeding. °· Do not take baths, swim, or use a hot tub until your health care provider approves. °· Check your insertion site every day for redness, swelling, or drainage. °· Do not apply powder or lotion to the site. °· Do not lift over 10 lb (4.5 kg) for 5 days after your procedure or as directed by your health care provider. °· Ask your health care provider when it is okay to: °? Return to work or school. °? Resume usual physical activities or sports. °? Resume sexual activity. °· Do not drive home if you are discharged the same day as the procedure. Have someone else drive you. °· You may drive 24 hours after the procedure unless otherwise instructed by your health care provider. °· Do not operate machinery or power tools for 24 hours after the procedure or as directed by your health care  provider. °· If your procedure was done as an outpatient procedure, which means that you went home the same day as your procedure, a responsible adult should be with you for the first 24 hours after you arrive home. °· Keep all follow-up visits as directed by your health care provider. This is important. °Contact a health care provider if: °· You have a fever. °· You have chills. °· You have increased bleeding from the catheter insertion site. Hold pressure on the site. °Get help right away if: °· You have vision changes or loss of vision. °· You have numbness or weakness on one side of your body. °· You have difficulty talking, or you have slurred speech or cannot speak (aphasia). °· You feel confused or have difficulty remembering. °· You have unusual pain at the catheter insertion site. °· You have redness, warmth, or swelling at the catheter insertion site. °· You have drainage (other than a small amount of blood on the dressing) from the catheter insertion site. °· The catheter insertion site is bleeding, and the bleeding does not stop after 30 minutes of holding steady pressure on the site. °These symptoms may represent a serious problem that is an emergency. Do not wait to see if the symptoms will go away. Get medical help right away. Call your local emergency services (911 in [place] Do not drive yourself to the hospital. °This information is not intended to replace advice given to you by your health care provider. Make sure you discuss any questions   you have with your health care provider. °Document Released: 02/13/2014 Document Revised: 03/06/2016 Document Reviewed: 10/12/2013 °Elsevier Interactive Patient Education © 2017 [employer] °Moderate Conscious Sedation, Adult, Care After °These instructions provide you with information about caring for yourself after your procedure. Your health care provider may also give you more specific instructions. Your treatment has been planned according to current  medical practices, but problems sometimes occur. Call your health care provider if you have any problems or questions after your procedure. °What can I expect after the procedure? °After your procedure, it is common: °· To feel sleepy for several hours. °· To feel clumsy and have poor balance for several hours. °· To have poor judgment for several hours. °· To vomit if you eat too soon. ° °Follow these instructions at home: °For at least 24 hours after the procedure: ° °· Do not: °? Participate in activities where you could fall or become injured. °? Drive. °? Use heavy machinery. °? Drink alcohol. °? Take sleeping pills or medicines that cause drowsiness. °? Make important decisions or sign legal documents. °? Take care of children on your own. °· Rest. °Eating and drinking °· Follow the diet recommended by your health care provider. °· If you vomit: °? Drink water, juice, or soup when you can drink without vomiting. °? Make sure you have little or no nausea before eating solid foods. °General instructions °· Have a responsible adult stay with you until you are awake and alert. °· Take over-the-counter and prescription medicines only as told by your health care provider. °· If you smoke, do not smoke without supervision. °· Keep all follow-up visits as told by your health care provider. This is important. °Contact a health care provider if: °· You keep feeling nauseous or you keep vomiting. °· You feel light-headed. °· You develop a rash. °· You have a fever. °Get help right away if: °· You have trouble breathing. °This information is not intended to replace advice given to you by your health care provider. Make sure you discuss any questions you have with your health care provider. °Document Released: 07/20/2013 Document Revised: 03/03/2016 Document Reviewed: 01/19/2016 °Elsevier Interactive Patient Education © 2018 Elsevier Inc. ° °

## 2018-02-24 ENCOUNTER — Encounter (HOSPITAL_COMMUNITY): Payer: Self-pay | Admitting: Neurosurgery

## 2018-02-25 ENCOUNTER — Encounter (HOSPITAL_COMMUNITY): Payer: Medicare Other

## 2018-02-26 ENCOUNTER — Encounter: Payer: Self-pay | Admitting: Surgery

## 2018-02-26 ENCOUNTER — Encounter: Payer: Self-pay | Admitting: *Deleted

## 2018-02-26 ENCOUNTER — Other Ambulatory Visit: Payer: Self-pay

## 2018-02-26 ENCOUNTER — Ambulatory Visit (HOSPITAL_COMMUNITY)
Admission: RE | Admit: 2018-02-26 | Discharge: 2018-02-26 | Disposition: A | Discharge status: Home / Self Care | Payer: Medicare Other | Source: Ambulatory Visit | Attending: Surgery | Admitting: Surgery

## 2018-02-26 ENCOUNTER — Other Ambulatory Visit: Payer: Self-pay | Admitting: *Deleted

## 2018-02-26 ENCOUNTER — Ambulatory Visit (INDEPENDENT_AMBULATORY_CARE_PROVIDER_SITE_OTHER): Payer: Medicare Other | Admitting: Surgery

## 2018-02-26 ENCOUNTER — Ambulatory Visit (INDEPENDENT_AMBULATORY_CARE_PROVIDER_SITE_OTHER)
Admission: RE | Admit: 2018-02-26 | Discharge: 2018-02-26 | Disposition: A | Discharge status: Home / Self Care | Payer: Medicare Other | Source: Ambulatory Visit | Attending: Surgery | Admitting: Surgery

## 2018-02-26 VITALS — BP 106/63 | HR 61 | Temp 97.0°F | Resp 16 | Ht 65.5 in | Wt 211.0 lb

## 2018-02-26 DIAGNOSIS — I10 Essential (primary) hypertension: Secondary | ICD-10-CM | POA: Insufficient documentation

## 2018-02-26 DIAGNOSIS — I200 Unstable angina: Secondary | ICD-10-CM

## 2018-02-26 DIAGNOSIS — I714 Abdominal aortic aneurysm, without rupture, unspecified: Secondary | ICD-10-CM

## 2018-02-26 DIAGNOSIS — I872 Venous insufficiency (chronic) (peripheral): Secondary | ICD-10-CM | POA: Diagnosis not present

## 2018-02-26 DIAGNOSIS — I6529 Occlusion and stenosis of unspecified carotid artery: Secondary | ICD-10-CM | POA: Diagnosis not present

## 2018-02-26 DIAGNOSIS — I739 Peripheral vascular disease, unspecified: Secondary | ICD-10-CM

## 2018-02-26 DIAGNOSIS — Z87891 Personal history of nicotine dependence: Secondary | ICD-10-CM | POA: Insufficient documentation

## 2018-02-26 DIAGNOSIS — I70211 Atherosclerosis of native arteries of extremities with intermittent claudication, right leg: Secondary | ICD-10-CM | POA: Diagnosis not present

## 2018-02-26 NOTE — Progress Notes (Addendum)
[facility] [place]  Patient name: Heather Livingston MRN: [id number] DOB: [birth year] Sex: female   REQUESTING PROVIDER:    Dr. Eldridge Dace   REASON FOR CONSULT:    AAA/ R CIA Stenosis  HISTORY OF PRESENT ILLNESS:   Heather Livingston is a 67 y.o. female, who is here today for further discussions regarding her right common iliac stenosis.  I saw the patient while she was in the hospital for heart catheterization.  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  The patient gets severe hip and leg pain with activity such as going up a flight of steps.  This is significantly restricting her quality of life.  She also has some symptoms that are not consistent with vascular disease such as pain in her right hip that will wake her up at night.  She does not have any open wounds.  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  She also has a 8 mm carotid pseudoaneurysm.  Her coronary disease is stable and is to be medically managed.  She does have a history of MI in the past.  She is on dual antiplatelet therapy.  She takes a statin for hypercholesterolemia.  She is medically managed for hypertension.  She is a former smoker.  PAST MEDICAL HISTORY    Past Medical History:  Diagnosis Date  . Bradycardia   . Cataracts, bilateral   . Coronary atherosclerosis of native coronary artery    DES circ 10/10, residual 100% occluded SVG to OM, patent LIMA to LAD and SVG to PDA grafts, LVEF 50%  . GERD (gastroesophageal reflux disease)   . Hyperlipemia   . Hypertension   . MI (myocardial infarction) (HCC)   . OSA (obstructive sleep apnea)    On CPAP  . Palpitations    Transient PAT/AF, CHADS2 score 1     FAMILY HISTORY   Family History  Problem Relation Age of Onset  . Heart attack Father   . Hypertension Brother     SOCIAL  HISTORY:   Social History   Socioeconomic History  . Marital status: Married    Spouse name: Not on file  . Number of children: Not on file  . Years of education: Not on file  . Highest education level: Not on file  Occupational History  . Occupation: Disabled  Social Needs  . Financial resource strain: Not on file  . Food insecurity:    Worry: Not on file    Inability: Not on file  . Transportation needs:    Medical: Not on file    Non-medical: Not on file  Tobacco Use  . Smoking status: Former Smoker    Packs/day: 0.75    Years: 43.00    Pack years: 32.25    Types: Cigarettes    Start date: 09/13/1965    Last attempt to quit: 10/14/2007    Years since quitting: 10.3  . Smokeless tobacco: Never Used  Substance and Sexual Activity  . Alcohol use: Yes    Alcohol/week: 0.0 oz    Comment: Occasional wine or beer  . Drug use: No  . Sexual activity: Not on file  Lifestyle  . Physical activity:    Days per week: Not on file    Minutes per session: Not on file  . Stress: Not on file  Relationships  . Social connections:  Talks on phone: Not on file    Gets together: Not on file    Attends religious service: Not on file    Active member of club or organization: Not on file    Attends meetings of clubs or organizations: Not on file    Relationship status: Not on file  . Intimate partner violence:    Fear of current or ex partner: Not on file    Emotionally abused: Not on file    Physically abused: Not on file    Forced sexual activity: Not on file  Other Topics Concern  . Not on file  Social History Narrative  . Not on file    ALLERGIES:    No Known Allergies  CURRENT MEDICATIONS:    Current Outpatient Medications  Medication Sig Dispense Refill  . albuterol (VENTOLIN HFA) 108 (90 Base) MCG/ACT inhaler Inhale 1-2 puffs into the lungs every 6 (six) hours as needed for shortness of breath.     Marland Kitchen amLODipine (NORVASC) 10 MG tablet Take 1 tablet (10 mg total)  by mouth daily.    Marland Kitchen aspirin EC 81 MG tablet Take 1 tablet (81 mg total) by mouth daily.    . clopidogrel (PLAVIX) 75 MG tablet Take 1 tablet (75 mg total) by mouth daily. 90 tablet 1  . hydrALAZINE (APRESOLINE) 100 MG tablet Take 1 tablet (100 mg total) by mouth 3 (three) times daily. 90 tablet 1  . hydrochlorothiazide (HYDRODIURIL) 25 MG tablet TAKE 1 TABLET BY MOUTH EVERY DAY 90 tablet 3  . HYDROcodone-acetaminophen (NORCO) 7.5-325 MG tablet Take 1 tablet by mouth every 6 (six) hours as needed for moderate pain.    . isosorbide mononitrate (IMDUR) 60 MG 24 hr tablet Take 1 tablet (60 mg total) by mouth daily. 90 tablet 1  . losartan (COZAAR) 100 MG tablet Take 1 tablet (100 mg total) by mouth daily. 90 tablet 1  . magnesium oxide (MAG-OX) 400 (241.3 Mg) MG tablet Take 1 tablet by mouth daily.  3  . meclizine (ANTIVERT) 25 MG tablet Take 1 tablet (25 mg total) by mouth 3 (three) times daily. 30 tablet 0  . metoprolol tartrate (LOPRESSOR) 25 MG tablet Take 1 tablet (25 mg total) by mouth 2 (two) times daily. 60 tablet 1  . nitroGLYCERIN (NITROSTAT) 0.4 MG SL tablet Place 1 tablet (0.4 mg total) under the tongue every 5 (five) minutes x 3 doses as needed for chest pain. 25 tablet 3  . ranitidine (ZANTAC) 300 MG tablet Take 300 mg by mouth at bedtime.    . simvastatin (ZOCOR) 40 MG tablet TAKE 1 TABLET BY MOUTH EVERYDAY AT BEDTIME 90 tablet 1  . spironolactone (ALDACTONE) 25 MG tablet Take 0.5 tablets (12.5 mg total) by mouth daily. 30 tablet 11   No current facility-administered medications for this visit.     REVIEW OF SYSTEMS:    denotes positive finding,  denotes negative finding Cardiac  Comments:  Chest pain or chest pressure:    Shortness of breath upon exertion: x   Short of breath when lying flat: x   Irregular heart rhythm: x       Vascular    Pain in calf, thigh, or hip brought on by ambulation: x   Pain in feet at night that wakes you up from your sleep:     Blood clot  in your veins:    Leg swelling:  x       Pulmonary    Oxygen at home:  Productive cough:     Wheezing:         Neurologic    Sudden weakness in arms or legs:     Sudden numbness in arms or legs:  x   Sudden onset of difficulty speaking or slurred speech:    Temporary loss of vision in one eye:     Problems with dizziness:  x       Gastrointestinal    Blood in stool:      Vomited blood:         Genitourinary    Burning when urinating:     Blood in urine:        Psychiatric    Major depression:         Hematologic    Bleeding problems:    Problems with blood clotting too easily:        Skin    Rashes or ulcers:        Constitutional    Fever or chills:     PHYSICAL EXAM:   Vitals:   02/26/18 0950  BP: 106/63  Pulse: 61  Resp: 16  Temp: (!) 97 F (36.1 C)  TempSrc: Oral  SpO2: 95%  Weight: 211 lb (95.7 kg)  Height: 5' 5.5" (1.664 m)    GENERAL: The patient is a well-nourished female, in no acute distress. The vital signs are documented above. CARDIAC: There is a regular rate and rhythm.  VASCULAR: Palpable left pedal pulse, nonpalpable right no carotid bruits PULMONARY: Nonlabored respirations ABDOMEN: Soft and non-tender with normal pitched bowel sounds.  MUSCULOSKELETAL: There are no major deformities or cyanosis. NEUROLOGIC: No focal weakness or paresthesias are detected. SKIN: There are no ulcers or rashes noted. PSYCHIATRIC: The patient has a normal affect.  STUDIES:   I have reviewed the following studies: Carotid duplex: No significant stenosis bilaterally ABIs: Right= 0.89 Left= 1.06  CT angiogram: 1. 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 2. 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 Incidentally, the focal stenosis is within a region of focal vessel tortuosity. 3. 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 4. Focal mild stenosis of the more superior right renal artery. 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 6. Additional ancillary findings as above.   ASSESSMENT and PLAN   AAA and right common iliac stenosis: I discussed with the patient that her aneurysm does not meet size criteria to repair, measuring 3.6 cm, however if we proceed with correcting her right common iliac stenosis without addressing her aneurysm, this could preclude her from being a endovascular candidate in the future.  Therefore I recommended proceeding with endovascular aneurysm repair specifically to address her right common iliac stenosis which is severely symptomatic.  I discussed the risks and benefits of the procedure including the risk of intestinal ischemia lower extremity distal embolization, cardiopulmonary complications and renal insufficiency.  All of her questions were answered.  I will schedule her for Wednesday, June 5.  She will need to be off of her Plavix prior to her procedure.   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  She will need surveillance imaging of this in the future with plans for repair once she becomes greater than 5 cm.  Durene Cal, MD [facility] [phone] Pager [phone]

## 2018-03-01 ENCOUNTER — Telehealth: Payer: Self-pay | Admitting: *Deleted

## 2018-03-01 NOTE — Telephone Encounter (Signed)
-----   Message from Jonelle Sidle, MD sent at 02/28/2018  1:55 PM EDT ----- Regarding: RE: Medication Clearance Yes, she may hold Plavix for 5 days prior to EVAR and continue ASA.  ----- Message ----- From: Retta Mac, RN Sent: 02/26/2018  11:18 AM To: Jonelle Sidle, MD Subject: Medication Clearance                           Requesting medication clearance. Patient is scheduled for EVAR with Dr. Myra Gianotti on 03/17/18. Will need to hold Plavix for 5 days pre-op and continue ASA. May respond via this messaging system.  Thank you for your time. Becky RN

## 2018-03-03 ENCOUNTER — Encounter

## 2018-03-03 ENCOUNTER — Ambulatory Visit: Payer: Medicare Other | Admitting: Cardiology

## 2018-03-05 ENCOUNTER — Ambulatory Visit: Payer: Medicare Other | Admitting: Cardiology

## 2018-03-09 ENCOUNTER — Encounter (HOSPITAL_COMMUNITY): Payer: Self-pay

## 2018-03-09 NOTE — Pre-Procedure Instructions (Signed)
Heather Livingston  03/09/2018    Your procedure is scheduled on Wednesday, March 17, 2018 at 8:30 AM.   Report to [facility] Entrance "A" Admitting Office at 6:30 AM.   Call this number if you have problems the morning of surgery: [phone]   Questions prior to day of surgery, please call [phone] between 8 & 4 PM.   Remember:  Do not eat food or drink liquids after midnight, Tuesday, 03/16/18.  Take these medicines the morning of surgery with A SIP OF WATER: Amlodipine (Norvasc), Aspirin, Hydralazine (Apresoline), Isosorbide Mononitrate (Imdur), Metoprolol (Lopressor), Hydrocodone - if needed, Nitroglycerin - if needed, Albuterol inhaler - if needed (bring inhaler with day of surgery)  Stop Plavix 5 days prior to surgery as instructed by your physician/cardiologist. Do not use NSAIDS (Ibuprofen, Aleve, etc) 7 days prior to surgery.    Do not wear jewelry, make-up or nail polish.  Do not wear lotions, powders, perfumes or deodorant.  Do not shave 48 hours prior to surgery.    Do not bring valuables to the hospital.  [facility] is not responsible for any belongings or valuables.  Contacts, dentures or bridgework may not be worn into surgery.  Leave your suitcase in the car.  After surgery it may be brought to your room.  For patients admitted to the hospital, discharge time will be determined by your treatment team.  [facility] - Preparing for Surgery  Before surgery, you can play an important role.  Because skin is not sterile, your skin needs to be as free of germs as possible.  You can reduce the number of germs on you skin by washing with CHG (chlorahexidine gluconate) soap before surgery.  CHG is an antiseptic cleaner which kills germs and bonds with the skin to continue killing germs even after washing.  Oral Hygiene is also important in reducing the risk of infection.  Remember to brush your teeth with your regular toothpaste the morning of surgery.  Please DO  NOT use if you have an allergy to CHG or antibacterial soaps.  If your skin becomes reddened/irritated stop using the CHG and inform your nurse when you arrive at Short Stay.  Do not shave (including legs and underarms) for at least 48 hours prior to the first CHG shower.  You may shave your face.  Please follow these instructions carefully:   1.  Shower with CHG Soap the night before surgery and the morning of Surgery.  2.  If you choose to wash your hair, wash your hair first as usual with your normal shampoo.  3.  After you shampoo, rinse your hair and body thoroughly to remove the shampoo. 4.  Use CHG as you would any other liquid soap.  You can apply chg directly to the skin and wash gently with a      scrungie or washcloth.           5.  Apply the CHG Soap to your body ONLY FROM THE NECK DOWN.   Do not use on open wounds or open sores. Avoid contact with your eyes, ears, mouth and genitals (private parts).  Wash genitals (private parts) with your normal soap.  6.  Wash thoroughly, paying special attention to the area where your surgery will be performed.  7.  Thoroughly rinse your body with warm water from the neck down.  8.  DO NOT shower/wash with your normal soap after using and rinsing off the CHG Soap.  9.  Pat yourself dry with a clean towel.            10.  Wear clean pajamas.            11.  Place clean sheets on your bed the night of your first shower and do not sleep with pets.  Day of Surgery  Shower as above. Do not apply any lotions/deodorants the morning of surgery.   Please wear clean clothes to the hospital. Remember to brush your teeth with toothpaste.   Please read over the fact sheets that you were given.

## 2018-03-10 ENCOUNTER — Encounter (HOSPITAL_COMMUNITY): Payer: Self-pay

## 2018-03-10 ENCOUNTER — Encounter (HOSPITAL_COMMUNITY)
Admission: RE | Admit: 2018-03-10 | Discharge: 2018-03-10 | Disposition: A | Discharge status: Home / Self Care | Payer: Medicare Other | Source: Ambulatory Visit | Attending: Surgery | Admitting: Surgery

## 2018-03-10 ENCOUNTER — Other Ambulatory Visit: Payer: Self-pay

## 2018-03-10 ENCOUNTER — Other Ambulatory Visit (HOSPITAL_COMMUNITY): Payer: Self-pay | Admitting: *Deleted

## 2018-03-10 DIAGNOSIS — Z7902 Long term (current) use of antithrombotics/antiplatelets: Secondary | ICD-10-CM | POA: Diagnosis not present

## 2018-03-10 DIAGNOSIS — Z01818 Encounter for other preprocedural examination: Secondary | ICD-10-CM | POA: Insufficient documentation

## 2018-03-10 DIAGNOSIS — Z7982 Long term (current) use of aspirin: Secondary | ICD-10-CM | POA: Insufficient documentation

## 2018-03-10 DIAGNOSIS — I671 Cerebral aneurysm, nonruptured: Secondary | ICD-10-CM | POA: Diagnosis not present

## 2018-03-10 DIAGNOSIS — Z79899 Other long term (current) drug therapy: Secondary | ICD-10-CM | POA: Insufficient documentation

## 2018-03-10 HISTORY — DX: Personal history of other diseases of the digestive system: Z87.19

## 2018-03-10 HISTORY — DX: Cerebral aneurysm, nonruptured: I67.1

## 2018-03-10 HISTORY — DX: Restless legs syndrome: G25.81

## 2018-03-10 HISTORY — DX: Pneumonia, unspecified organism: J18.9

## 2018-03-10 HISTORY — DX: Unspecified osteoarthritis, unspecified site: M19.90

## 2018-03-10 HISTORY — DX: Iron deficiency: E61.1

## 2018-03-10 HISTORY — DX: Cerebral infarction, unspecified: I63.9

## 2018-03-10 LAB — URINALYSIS, ROUTINE W REFLEX MICROSCOPIC
BILIRUBIN URINE: NEGATIVE
GLUCOSE, UA: NEGATIVE mg/dL
Hgb urine dipstick: NEGATIVE
KETONES UR: NEGATIVE mg/dL
Leukocytes, UA: NEGATIVE
NITRITE: NEGATIVE
PH: 7 (ref 5.0–8.0)
PROTEIN: NEGATIVE mg/dL
Specific Gravity, Urine: 1.01 (ref 1.005–1.030)

## 2018-03-10 LAB — SURGICAL PCR SCREEN
MRSA, PCR: NEGATIVE
Staphylococcus aureus: NEGATIVE

## 2018-03-10 LAB — COMPREHENSIVE METABOLIC PANEL
ALBUMIN: 4.1 g/dL (ref 3.5–5.0)
ALT: 24 U/L (ref 14–54)
AST: 24 U/L (ref 15–41)
Alkaline Phosphatase: 51 U/L (ref 38–126)
Anion gap: 7 (ref 5–15)
BUN: 17 mg/dL (ref 6–20)
CHLORIDE: 105 mmol/L (ref 101–111)
CO2: 27 mmol/L (ref 22–32)
CREATININE: 0.8 mg/dL (ref 0.44–1.00)
Calcium: 9.7 mg/dL (ref 8.9–10.3)
GFR calc Af Amer: >60 mL/min (ref >60)
Glucose, Bld: 98 mg/dL (ref 65–99)
POTASSIUM: 3.7 mmol/L (ref 3.5–5.1)
SODIUM: 139 mmol/L (ref 135–145)
Total Bilirubin: 0.7 mg/dL (ref 0.3–1.2)
Total Protein: 6.8 g/dL (ref 6.5–8.1)

## 2018-03-10 LAB — TYPE AND SCREEN
ABO/RH(D): AB POS
ANTIBODY SCREEN: NEGATIVE

## 2018-03-10 LAB — CBC
HCT: 44.9 % (ref 36.0–46.0)
Hemoglobin: 15.1 g/dL — ABNORMAL HIGH (ref 12.0–15.0)
MCH: 31.3 pg (ref 26.0–34.0)
MCHC: 33.6 g/dL (ref 30.0–36.0)
MCV: 93 fL (ref 78.0–100.0)
PLATELETS: 241 10*3/uL (ref 150–400)
RBC: 4.83 MIL/uL (ref 3.87–5.11)
RDW: 13.3 % (ref 11.5–15.5)
WBC: 5 10*3/uL (ref 4.0–10.5)

## 2018-03-10 LAB — APTT: APTT: 32 s (ref 24–36)

## 2018-03-10 LAB — PROTIME-INR
INR: 0.99
PROTHROMBIN TIME: 13 s (ref 11.4–15.2)

## 2018-03-10 LAB — ABO/RH: ABO/RH(D): AB POS

## 2018-03-10 NOTE — Progress Notes (Signed)
Pt has extensive cardiac history with CABG in 2009, stents in 2014. Had recent cath in 01/2018, no stents or angioplasty done. Pt was found to have a aortic aneurysm, poor circulation in lower extremities and a brain aneurysm in April. Pt denies any recent chest pain or sob. Pt is not diabetic.  Cardiac Clearance noted on 01/27/18 in Epic on 01/27/18. Pt has been instructed to continue her Aspirin, but to stop her Plavix 5 days prior to surgery. Pt's last dose will be 03/11/18.

## 2018-03-12 ENCOUNTER — Other Ambulatory Visit: Payer: Self-pay | Admitting: Cardiology

## 2018-03-15 ENCOUNTER — Other Ambulatory Visit: Payer: Self-pay | Admitting: Neurosurgery

## 2018-03-16 ENCOUNTER — Other Ambulatory Visit: Payer: Self-pay | Admitting: Cardiology

## 2018-03-16 NOTE — Telephone Encounter (Signed)
This is a [place] pt °

## 2018-03-17 ENCOUNTER — Encounter (HOSPITAL_COMMUNITY): Payer: Self-pay | Admitting: *Deleted

## 2018-03-17 ENCOUNTER — Inpatient Hospital Stay (HOSPITAL_COMMUNITY): Admission: RE | Admit: 2018-03-17 | Payer: Medicare Other | Source: Ambulatory Visit | Admitting: Surgery

## 2018-03-17 ENCOUNTER — Other Ambulatory Visit: Payer: Self-pay

## 2018-03-17 ENCOUNTER — Encounter (HOSPITAL_COMMUNITY): Admission: RE | Payer: Self-pay | Source: Ambulatory Visit

## 2018-03-17 SURGERY — INSERTION, ENDOVASCULAR STENT GRAFT, AORTA, ABDOMINAL
Anesthesia: General

## 2018-03-17 NOTE — Progress Notes (Signed)
Pt denies any acute cardiopulmonary issues. Pt under the care of Dr. Diona BrownerMcDowell, Cardiology. Pt made aware to stop taking vitamins, fish oil and herbal medications. Do not take any NSAIDs ie: Ibuprofen, Advil, Naproxen (Aleve), Motrin, BC and Goody Powder. Nicki, Surgical Coordinator to call pt and clarify Aspirin and Plavix pre-op instructions. Otherwise, pt verbalized understanding of all pre-op instructions.

## 2018-03-18 ENCOUNTER — Encounter (HOSPITAL_COMMUNITY)
Admission: RE | Disposition: A | Discharge status: Home / Self Care | Payer: Self-pay | Source: Ambulatory Visit | Attending: Neurosurgery

## 2018-03-18 ENCOUNTER — Inpatient Hospital Stay (HOSPITAL_COMMUNITY): Payer: Medicare Other

## 2018-03-18 ENCOUNTER — Inpatient Hospital Stay (HOSPITAL_COMMUNITY)
Admission: RE | Admit: 2018-03-18 | Discharge: 2018-03-19 | DRG: 272 | Disposition: A | Discharge status: Home / Self Care | Payer: Medicare Other | Source: Ambulatory Visit | Attending: Neurosurgery | Admitting: Neurosurgery

## 2018-03-18 ENCOUNTER — Inpatient Hospital Stay (HOSPITAL_COMMUNITY): Payer: Medicare Other | Admitting: Emergency Medicine

## 2018-03-18 ENCOUNTER — Encounter (HOSPITAL_COMMUNITY): Payer: Self-pay | Admitting: Anesthesiology

## 2018-03-18 ENCOUNTER — Other Ambulatory Visit: Payer: Self-pay | Admitting: Neurosurgery

## 2018-03-18 DIAGNOSIS — Z79899 Other long term (current) drug therapy: Secondary | ICD-10-CM | POA: Diagnosis not present

## 2018-03-18 DIAGNOSIS — K219 Gastro-esophageal reflux disease without esophagitis: Secondary | ICD-10-CM | POA: Diagnosis present

## 2018-03-18 DIAGNOSIS — Z7951 Long term (current) use of inhaled steroids: Secondary | ICD-10-CM

## 2018-03-18 DIAGNOSIS — I725 Aneurysm of other precerebral arteries: Principal | ICD-10-CM | POA: Diagnosis present

## 2018-03-18 DIAGNOSIS — Z7982 Long term (current) use of aspirin: Secondary | ICD-10-CM

## 2018-03-18 DIAGNOSIS — G4730 Sleep apnea, unspecified: Secondary | ICD-10-CM | POA: Diagnosis present

## 2018-03-18 DIAGNOSIS — E785 Hyperlipidemia, unspecified: Secondary | ICD-10-CM | POA: Diagnosis present

## 2018-03-18 DIAGNOSIS — Z951 Presence of aortocoronary bypass graft: Secondary | ICD-10-CM

## 2018-03-18 DIAGNOSIS — G2581 Restless legs syndrome: Secondary | ICD-10-CM | POA: Diagnosis present

## 2018-03-18 DIAGNOSIS — I2510 Atherosclerotic heart disease of native coronary artery without angina pectoris: Secondary | ICD-10-CM | POA: Diagnosis present

## 2018-03-18 DIAGNOSIS — I671 Cerebral aneurysm, nonruptured: Secondary | ICD-10-CM | POA: Diagnosis present

## 2018-03-18 DIAGNOSIS — Z87891 Personal history of nicotine dependence: Secondary | ICD-10-CM | POA: Diagnosis not present

## 2018-03-18 DIAGNOSIS — I10 Essential (primary) hypertension: Secondary | ICD-10-CM | POA: Diagnosis present

## 2018-03-18 DIAGNOSIS — I714 Abdominal aortic aneurysm, without rupture: Secondary | ICD-10-CM | POA: Diagnosis present

## 2018-03-18 DIAGNOSIS — Z955 Presence of coronary angioplasty implant and graft: Secondary | ICD-10-CM | POA: Diagnosis not present

## 2018-03-18 DIAGNOSIS — I729 Aneurysm of unspecified site: Secondary | ICD-10-CM

## 2018-03-18 DIAGNOSIS — G4733 Obstructive sleep apnea (adult) (pediatric): Secondary | ICD-10-CM | POA: Diagnosis present

## 2018-03-18 DIAGNOSIS — I252 Old myocardial infarction: Secondary | ICD-10-CM

## 2018-03-18 HISTORY — DX: Cerebral aneurysm, nonruptured: I67.1

## 2018-03-18 HISTORY — PX: IR ANGIO VERTEBRAL SEL VERTEBRAL UNI L MOD SED: IMG5367

## 2018-03-18 HISTORY — PX: IR ANGIOGRAM FOLLOW UP STUDY: IMG697

## 2018-03-18 HISTORY — PX: IR TRANSCATH/EMBOLIZ: IMG695

## 2018-03-18 HISTORY — PX: RADIOLOGY WITH ANESTHESIA: SHX6223

## 2018-03-18 LAB — PLATELET INHIBITION P2Y12: Platelet Function  P2Y12: 179 [PRU] — ABNORMAL LOW (ref 194–418)

## 2018-03-18 LAB — PROTIME-INR
INR: 0.91
PROTHROMBIN TIME: 12.1 s (ref 11.4–15.2)

## 2018-03-18 LAB — APTT: aPTT: 25 seconds (ref 24–36)

## 2018-03-18 SURGERY — IR WITH ANESTHESIA
Anesthesia: General

## 2018-03-18 MED ORDER — ONDANSETRON HCL 4 MG/2ML IJ SOLN
INTRAMUSCULAR | Status: DC | PRN
  Administered 2018-03-18: 4 mg via INTRAVENOUS

## 2018-03-18 MED ORDER — ONDANSETRON HCL 4 MG PO TABS: 4.0000 mg | ORAL_TABLET | ORAL | Status: DC | PRN

## 2018-03-18 MED ORDER — FENTANYL CITRATE (PF) 100 MCG/2ML IJ SOLN
INTRAMUSCULAR | Status: DC | PRN
  Administered 2018-03-18 (×2): 50 ug via INTRAVENOUS

## 2018-03-18 MED ORDER — LOSARTAN POTASSIUM 50 MG PO TABS
100.0000 mg | ORAL_TABLET | Freq: Every day | ORAL | Status: DC
  Administered 2018-03-18: 100 mg via ORAL
  Filled 2018-03-18: qty 2

## 2018-03-18 MED ORDER — IOHEXOL 300 MG/ML  SOLN: 35.0000 mL | Freq: Once | INTRAMUSCULAR | Status: DC | PRN

## 2018-03-18 MED ORDER — OXYCODONE HCL 5 MG PO TABS
5.0000 mg | ORAL_TABLET | ORAL | Status: DC | PRN
  Administered 2018-03-18: 10 mg via ORAL
  Filled 2018-03-18: qty 2

## 2018-03-18 MED ORDER — HYDROCODONE-ACETAMINOPHEN 7.5-325 MG PO TABS: 1.0000 | ORAL_TABLET | Freq: Four times a day (QID) | ORAL | Status: DC | PRN

## 2018-03-18 MED ORDER — HYDRALAZINE HCL 25 MG PO TABS
100.0000 mg | ORAL_TABLET | Freq: Three times a day (TID) | ORAL | Status: DC
  Administered 2018-03-18: 100 mg via ORAL
  Filled 2018-03-18: qty 4

## 2018-03-18 MED ORDER — FENTANYL CITRATE (PF) 100 MCG/2ML IJ SOLN
INTRAMUSCULAR | Status: AC
  Filled 2018-03-18: qty 2

## 2018-03-18 MED ORDER — HYDROMORPHONE HCL 1 MG/ML IJ SOLN: 1.0000 mg | INTRAMUSCULAR | Status: DC | PRN

## 2018-03-18 MED ORDER — AMLODIPINE BESYLATE 5 MG PO TABS: 5.0000 mg | ORAL_TABLET | Freq: Every day | ORAL | Status: DC

## 2018-03-18 MED ORDER — ASPIRIN EC 81 MG PO TBEC: 81.0000 mg | DELAYED_RELEASE_TABLET | Freq: Every day | ORAL | Status: DC

## 2018-03-18 MED ORDER — FLUTICASONE PROPIONATE 50 MCG/ACT NA SUSP
2.0000 | Freq: Every evening | NASAL | Status: DC | PRN
  Filled 2018-03-18: qty 16

## 2018-03-18 MED ORDER — ACETAMINOPHEN 325 MG PO TABS: 325.0000 mg | ORAL_TABLET | ORAL | Status: DC | PRN

## 2018-03-18 MED ORDER — ACETAMINOPHEN 160 MG/5ML PO SOLN: 325.0000 mg | ORAL | Status: DC | PRN

## 2018-03-18 MED ORDER — HYDRALAZINE HCL 20 MG/ML IJ SOLN: 25.0000 mg | INTRAMUSCULAR | Status: DC | PRN

## 2018-03-18 MED ORDER — ROCURONIUM BROMIDE 10 MG/ML (PF) SYRINGE
PREFILLED_SYRINGE | INTRAVENOUS | Status: DC | PRN
  Administered 2018-03-18: 50 mg via INTRAVENOUS
  Administered 2018-03-18: 30 mg via INTRAVENOUS

## 2018-03-18 MED ORDER — PROPOFOL 10 MG/ML IV BOLUS
INTRAVENOUS | Status: DC | PRN
  Administered 2018-03-18: 20 mg via INTRAVENOUS
  Administered 2018-03-18: 150 mg via INTRAVENOUS

## 2018-03-18 MED ORDER — LACTATED RINGERS IV SOLN
INTRAVENOUS | Status: DC
  Administered 2018-03-18: 13:00:00 via INTRAVENOUS

## 2018-03-18 MED ORDER — SIMVASTATIN 40 MG PO TABS: 40.0000 mg | ORAL_TABLET | Freq: Every day | ORAL | Status: DC

## 2018-03-18 MED ORDER — HYDROCHLOROTHIAZIDE 25 MG PO TABS
25.0000 mg | ORAL_TABLET | Freq: Every day | ORAL | Status: DC
  Administered 2018-03-18: 25 mg via ORAL
  Filled 2018-03-18: qty 1

## 2018-03-18 MED ORDER — SODIUM CHLORIDE 0.9 % IV SOLN
INTRAVENOUS | Status: DC
  Administered 2018-03-18: 23:00:00 via INTRAVENOUS

## 2018-03-18 MED ORDER — LABETALOL HCL 5 MG/ML IV SOLN: 5.0000 mg | INTRAVENOUS | Status: DC | PRN

## 2018-03-18 MED ORDER — OXYCODONE HCL 5 MG PO TABS: 5.0000 mg | ORAL_TABLET | Freq: Once | ORAL | Status: DC | PRN

## 2018-03-18 MED ORDER — SPIRONOLACTONE 12.5 MG HALF TABLET
12.5000 mg | ORAL_TABLET | Freq: Every day | ORAL | Status: DC
  Administered 2018-03-18: 12.5 mg via ORAL
  Filled 2018-03-18 (×2): qty 1

## 2018-03-18 MED ORDER — HEPARIN SODIUM (PORCINE) 1000 UNIT/ML IJ SOLN
INTRAMUSCULAR | Status: DC | PRN
  Administered 2018-03-18: 5000 [IU] via INTRAVENOUS

## 2018-03-18 MED ORDER — MIDAZOLAM HCL 5 MG/5ML IJ SOLN
INTRAMUSCULAR | Status: DC | PRN
  Administered 2018-03-18: 2 mg via INTRAVENOUS

## 2018-03-18 MED ORDER — SUGAMMADEX SODIUM 500 MG/5ML IV SOLN
INTRAVENOUS | Status: DC | PRN
  Administered 2018-03-18: 390 mg via INTRAVENOUS

## 2018-03-18 MED ORDER — CEFAZOLIN SODIUM-DEXTROSE 2-4 GM/100ML-% IV SOLN
INTRAVENOUS | Status: AC
  Filled 2018-03-18: qty 100

## 2018-03-18 MED ORDER — ALBUTEROL SULFATE (2.5 MG/3ML) 0.083% IN NEBU: 2.5000 mg | INHALATION_SOLUTION | Freq: Four times a day (QID) | RESPIRATORY_TRACT | Status: DC | PRN

## 2018-03-18 MED ORDER — MAGNESIUM OXIDE 400 (241.3 MG) MG PO TABS
400.0000 mg | ORAL_TABLET | Freq: Every day | ORAL | Status: DC
  Filled 2018-03-18: qty 1

## 2018-03-18 MED ORDER — ATORVASTATIN CALCIUM 20 MG PO TABS
20.0000 mg | ORAL_TABLET | Freq: Every day | ORAL | Status: DC
  Administered 2018-03-18: 20 mg via ORAL
  Filled 2018-03-18: qty 1

## 2018-03-18 MED ORDER — ONDANSETRON HCL 4 MG/2ML IJ SOLN: 4.0000 mg | INTRAMUSCULAR | Status: DC | PRN

## 2018-03-18 MED ORDER — MECLIZINE HCL 25 MG PO TABS
25.0000 mg | ORAL_TABLET | Freq: Three times a day (TID) | ORAL | Status: DC
  Administered 2018-03-18: 25 mg via ORAL
  Filled 2018-03-18 (×2): qty 1

## 2018-03-18 MED ORDER — SODIUM CHLORIDE 0.9 % IV SOLN: INTRAVENOUS | Status: DC

## 2018-03-18 MED ORDER — CHLORHEXIDINE GLUCONATE CLOTH 2 % EX PADS: 6.0000 | MEDICATED_PAD | Freq: Once | CUTANEOUS | Status: DC

## 2018-03-18 MED ORDER — CLOPIDOGREL BISULFATE 75 MG PO TABS: 75.0000 mg | ORAL_TABLET | Freq: Every day | ORAL | Status: DC

## 2018-03-18 MED ORDER — NITROGLYCERIN 0.2 MG/ML ON CALL CATH LAB
INTRAVENOUS | Status: DC | PRN
  Administered 2018-03-18 (×3): 20 ug via INTRAVENOUS

## 2018-03-18 MED ORDER — FENTANYL CITRATE (PF) 100 MCG/2ML IJ SOLN
25.0000 ug | INTRAMUSCULAR | Status: DC | PRN
  Administered 2018-03-18 (×4): 25 ug via INTRAVENOUS

## 2018-03-18 MED ORDER — AMLODIPINE BESYLATE 10 MG PO TABS: 10.0000 mg | ORAL_TABLET | Freq: Every day | ORAL | Status: DC

## 2018-03-18 MED ORDER — ONDANSETRON HCL 4 MG/2ML IJ SOLN: 4.0000 mg | Freq: Once | INTRAMUSCULAR | Status: DC | PRN

## 2018-03-18 MED ORDER — FAMOTIDINE 20 MG PO TABS
20.0000 mg | ORAL_TABLET | Freq: Two times a day (BID) | ORAL | Status: DC
  Administered 2018-03-18: 20 mg via ORAL
  Filled 2018-03-18: qty 1

## 2018-03-18 MED ORDER — METOPROLOL TARTRATE 25 MG PO TABS
25.0000 mg | ORAL_TABLET | Freq: Two times a day (BID) | ORAL | Status: DC
  Administered 2018-03-18: 25 mg via ORAL
  Filled 2018-03-18: qty 1

## 2018-03-18 MED ORDER — LABETALOL HCL 5 MG/ML IV SOLN
INTRAVENOUS | Status: DC | PRN
  Administered 2018-03-18 (×3): 5 mg via INTRAVENOUS

## 2018-03-18 MED ORDER — PHENYLEPHRINE HCL 10 MG/ML IJ SOLN
INTRAVENOUS | Status: DC | PRN
  Administered 2018-03-18: 25 ug/min via INTRAVENOUS

## 2018-03-18 MED ORDER — MEPERIDINE HCL 50 MG/ML IJ SOLN: 6.2500 mg | INTRAMUSCULAR | Status: DC | PRN

## 2018-03-18 MED ORDER — LIDOCAINE 2% (20 MG/ML) 5 ML SYRINGE
INTRAMUSCULAR | Status: DC | PRN
  Administered 2018-03-18: 80 mg via INTRAVENOUS

## 2018-03-18 MED ORDER — CEFAZOLIN SODIUM-DEXTROSE 2-4 GM/100ML-% IV SOLN
2.0000 g | INTRAVENOUS | Status: AC
  Administered 2018-03-18: 2 g via INTRAVENOUS

## 2018-03-18 MED ORDER — NITROGLYCERIN 0.4 MG SL SUBL: 0.4000 mg | SUBLINGUAL_TABLET | SUBLINGUAL | Status: DC | PRN

## 2018-03-18 MED ORDER — OXYCODONE HCL 5 MG/5ML PO SOLN: 5.0000 mg | Freq: Once | ORAL | Status: DC | PRN

## 2018-03-18 MED ORDER — ISOSORBIDE MONONITRATE ER 30 MG PO TB24: 60.0000 mg | ORAL_TABLET | Freq: Every day | ORAL | Status: DC

## 2018-03-18 MED ORDER — HYDROCODONE-ACETAMINOPHEN 5-325 MG PO TABS
1.0000 | ORAL_TABLET | ORAL | Status: DC | PRN
  Administered 2018-03-19: 2 via ORAL
  Filled 2018-03-18: qty 2

## 2018-03-18 NOTE — Progress Notes (Signed)
Pt underwent successful basilar aneurysm coiling, full report in [facility] [employer] PACS. Seen postop awake, following commands, CN intact, MAE well. Will recover in PACU then ICU admit overnight.

## 2018-03-18 NOTE — Anesthesia Preprocedure Evaluation (Addendum)
Anesthesia Evaluation  Patient identified by MRN, date of birth, ID band Patient awake    Reviewed: Allergy & Precautions, H&P , NPO status , Patient's Chart, lab work & pertinent test results, reviewed documented beta blocker date and time   Airway Mallampati: I  TM Distance: >3 FB Neck ROM: full    Dental no notable dental hx. (+) Edentulous Upper, Upper Dentures   Pulmonary sleep apnea , former smoker,    Pulmonary exam normal breath sounds clear to auscultation       Cardiovascular Exercise Tolerance: Good hypertension, Pt. on medications and Pt. on home beta blockers + CAD and + Past MI  negative cardio ROS   Rhythm:regular Rate:Normal  Coronary atherosclerosis of native coronary artery  DES circ 10/10, residual 100% occluded SVG to OM, patent LIMA to LAD and SVG to PDA grafts, LVEF 50%    Neuro/Psych negative neurological ROS  negative psych ROS   GI/Hepatic negative GI ROS, Neg liver ROS, hiatal hernia, GERD  ,  Endo/Other  negative endocrine ROS  Renal/GU negative Renal ROS  negative genitourinary   Musculoskeletal   Abdominal   Peds  Hematology negative hematology ROS (+)   Anesthesia Other Findings   Reproductive/Obstetrics negative OB ROS                          Anesthesia Physical Anesthesia Plan  ASA: III  Anesthesia Plan: General   Post-op Pain Management:    Induction: Intravenous  PONV Risk Score and Plan: 3 and Ondansetron, Dexamethasone and Treatment may vary due to age or medical condition  Airway Management Planned: Oral ETT and LMA  Additional Equipment:   Intra-op Plan:   Post-operative Plan: Extubation in OR  Informed Consent: I have reviewed the patients History and Physical, chart, labs and discussed the procedure including the risks, benefits and alternatives for the proposed anesthesia with the patient or authorized representative who has indicated  his/her understanding and acceptance.   Dental Advisory Given  Plan Discussed with: CRNA, Anesthesiologist and Surgeon  Anesthesia Plan Comments: (  )       Anesthesia Quick Evaluation

## 2018-03-18 NOTE — Anesthesia Procedure Notes (Signed)
Arterial Line Insertion Start/End6/03/2018 11:00 AM Performed by: Burt Ekurner, Alonso Gapinski Ashley, CRNA, CRNA  Patient location: Pre-op. Preanesthetic checklist: patient identified, IV checked, site marked, risks and benefits discussed, surgical consent, monitors and equipment checked, pre-op evaluation, timeout performed and anesthesia consent Lidocaine 1% used for infiltration and patient sedated Left, radial was placed Catheter size: 20 G Hand hygiene performed  and maximum sterile barriers used   Attempts: 2 Procedure performed without using ultrasound guided technique. Following insertion, dressing applied and Biopatch. Post procedure assessment: normal  Patient tolerated the procedure well with no immediate complications.

## 2018-03-18 NOTE — Transfer of Care (Signed)
Immediate Anesthesia Transfer of Care Note  Patient: Sharen Heckrena R Colaizzi  Procedure(s) Performed: coil embolization of basilar aneurysm (N/A )  Patient Location: PACU  Anesthesia Type:General  Level of Consciousness: awake, alert , oriented, drowsy and patient cooperative  Airway & Oxygen Therapy: Patient Spontanous Breathing and Patient connected to face mask oxygen  Post-op Assessment: Report given to RN and Post -op Vital signs reviewed and stable  Post vital signs: Reviewed and stable  Last Vitals:  Vitals Value Taken Time  BP 122/60 03/18/2018  2:36 PM  Temp    Pulse 66 03/18/2018  2:40 PM  Resp 15 03/18/2018  2:40 PM  SpO2 96 % 03/18/2018  2:40 PM  Vitals shown include unvalidated device data.  Last Pain:  Vitals:   03/18/18 1016  TempSrc: Oral  PainSc: 6       Patients Stated Pain Goal: 6 (03/18/18 1016)  Complications: No apparent anesthesia complications

## 2018-03-18 NOTE — H&P (Signed)
Chief Complaint   Aneurysm  HPI   HPI: Heather Livingston is a 67 y.o. female with incidentally discovered basilar aneurysm during work up for elevated BP in the ER which included cardiac catherization. She  Subsequently underwent diagnostic cerebral angiogram for further characterization which was significant for leftward projecting approximately 8 millimeter basilar aneurysm arising between the left superior cerebellar artery and posterior cerebral artery. Based on size of the  Aneurysm, it was recommended that she undergo treatment. She presents today for treatment of aneurysm. She is without any concerns today.  Patient Active Problem List   Diagnosis Date Noted  . Chest pain 01/23/2018  . GERD (gastroesophageal reflux disease) 01/23/2018  . OSA (obstructive sleep apnea) 01/23/2018  . Aneurysm of infrarenal abdominal aorta (HCC) 01/21/2018  . 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 Unstable angina (HCC) 01/19/2018  . Essential hypertension, benign 10/29/2010  . CORONARY ATHEROSCLEROSIS NATIVE CORONARY ARTERY 10/29/2010  . SUPRAVENTRICULAR TACHYCARDIA 09/13/2010  . Hyperlipemia 08/27/2010  . 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 Palpitations 08/27/2010    PMH: Past Medical History:  Diagnosis Date  . Arthritis    back  . Bradycardia   . Brain aneurysm   . Cataracts, bilateral   . Coronary atherosclerosis of native coronary artery    DES circ 10/10, residual 100% occluded SVG to OM, patent LIMA to LAD and SVG to PDA grafts, LVEF 50%  . GERD (gastroesophageal reflux disease)   . History of hiatal hernia   . Hyperlipemia   . Hypertension   . Low iron   . MI (myocardial infarction) (HCC)   . Nonruptured cerebral aneurysm   . OSA (obstructive sleep apnea)    On CPAP  . Palpitations    Transient PAT/AF, CHADS2 score 1  . Pneumonia   . Restless legs   . Stroke [facility])    found on CT scan, pt not aware she had had them    PSH: Past Surgical History:  Procedure Laterality Date   . ABDOMINAL AORTOGRAM N/A 01/19/2018   Procedure: ABDOMINAL AORTOGRAM;  Surgeon: Corky Crafts, MD;  Location: [facility] INVASIVE CV LAB;  Service: Cardiovascular;  Laterality: N/A;  . ABDOMINAL HYSTERECTOMY    . CARPAL TUNNEL RELEASE    . CORONARY ARTERY BYPASS GRAFT  2009   [facility], LIMA to LAD, SVG to OM, SVG to PDA  . IR ANGIO INTRA EXTRACRAN SEL INTERNAL CAROTID BILAT MOD SED  02/23/2018  . IR ANGIO VERTEBRAL SEL VERTEBRAL BILAT MOD SED  02/23/2018  . LEFT HEART CATH AND CORS/GRAFTS ANGIOGRAPHY N/A 01/19/2018   Procedure: LEFT HEART CATH AND CORS/GRAFTS ANGIOGRAPHY;  Surgeon: Corky Crafts, MD;  Location: [facility] INVASIVE CV LAB;  Service: Cardiovascular;  Laterality: N/A;  . LEFT HEART CATHETERIZATION WITH CORONARY/GRAFT ANGIOGRAM N/A 12/27/2012   Procedure: LEFT HEART CATHETERIZATION WITH Isabel Caprice;  Surgeon: Herby Abraham, MD;  Location[facility] CATH LAB;  Service: Cardiovascular;  Laterality: N/A;    Medications Prior to Admission  Medication Sig Dispense Refill Last Dose  . albuterol (VENTOLIN HFA) 108 (90 Base) MCG/ACT inhaler Inhale 1-2 puffs into the lungs every 6 (six) hours as needed for shortness of breath.    03/18/2018 at 0700  . amLODipine (NORVASC) 10 MG tablet Take 1 tablet (10 mg total) by mouth daily.   03/18/2018 at 0700  . aspirin EC 81 MG tablet Take 1 tablet (81 mg total) by mouth daily.   03/18/2018 at 0700  . clopidogrel (PLAVIX) 75 MG tablet Take 1  tablet (75 mg total) by mouth daily. 90 tablet 1 03/18/2018 at 0700  . fluticasone (FLONASE) 50 MCG/ACT nasal spray Place 2 sprays into both nostrils at bedtime.   03/17/2018 at Unknown time  . hydrALAZINE (APRESOLINE) 100 MG tablet TAKE 1 TABLET (100 MG TOTAL) BY MOUTH 3 (THREE) TIMES DAILY. 90 tablet 1 03/18/2018 at 0700  . hydrochlorothiazide (HYDRODIURIL) 25 MG tablet TAKE 1 TABLET BY MOUTH EVERY DAY 90 tablet 3 03/17/2018 at Unknown time  . HYDROcodone-acetaminophen (NORCO) 7.5-325 MG tablet Take 1 tablet by mouth  every 6 (six) hours as needed for moderate pain.   03/17/2018 at Unknown time  . isosorbide mononitrate (IMDUR) 60 MG 24 hr tablet Take 1 tablet (60 mg total) by mouth daily. 90 tablet 1 03/18/2018 at 0700  . losartan (COZAAR) 100 MG tablet Take 1 tablet (100 mg total) by mouth daily. 90 tablet 1 03/17/2018 at Unknown time  . magnesium oxide (MAG-OX) 400 (241.3 Mg) MG tablet Take 400 mg by mouth daily.   3 03/17/2018 at Unknown time  . metoprolol tartrate (LOPRESSOR) 25 MG tablet TAKE 1 TABLET BY MOUTH TWICE A DAY 60 tablet 1 03/18/2018 at 0700  . ranitidine (ZANTAC) 300 MG tablet Take 300 mg by mouth at bedtime.   03/17/2018 at Unknown time  . simvastatin (ZOCOR) 40 MG tablet TAKE 1 TABLET BY MOUTH EVERYDAY AT BEDTIME 90 tablet 1 03/17/2018 at Unknown time  . spironolactone (ALDACTONE) 25 MG tablet Take 0.5 tablets (12.5 mg total) by mouth daily. 30 tablet 11 03/17/2018 at Unknown time  . amLODipine (NORVASC) 5 MG tablet Take 5 mg by mouth daily.  1 Unknown at Unknown time  . meclizine (ANTIVERT) 25 MG tablet Take 1 tablet (25 mg total) by mouth 3 (three) times daily. (Patient not taking: Reported on 03/01/2018) 30 tablet 0 Unknown at Unknown time  . nitroGLYCERIN (NITROSTAT) 0.4 MG SL tablet Place 1 tablet (0.4 mg total) under the tongue every 5 (five) minutes x 3 doses as needed for chest pain. 25 tablet 3 Unknown at Unknown time    SH: Social History   Tobacco Use  . Smoking status: Former Smoker    Packs/day: 0.75    Years: 43.00    Pack years: 32.25    Types: Cigarettes    Start date: 09/13/1965    Last attempt to quit: 10/14/2007    Years since quitting: 10.4  . Smokeless tobacco: Never Used  Substance Use Topics  . Alcohol use: Yes    Alcohol/week: 0.0 oz    Comment: Occasional wine or beer  . Drug use: No    MEDS: Prior to Admission medications   Medication Sig Start Date End Date Taking? Authorizing Provider  albuterol (VENTOLIN HFA) 108 (90 Base) MCG/ACT inhaler Inhale 1-2 puffs into the  lungs every 6 (six) hours as needed for shortness of breath.  12/19/16  Yes [provider]  amLODipine (NORVASC) 10 MG tablet Take 1 tablet (10 mg total) by mouth daily. 01/27/18  Yes Jonelle SidleMcDowell, Samuel G, MD  aspirin EC 81 MG tablet Take 1 tablet (81 mg total) by mouth daily. 01/14/13  Yes Serpe, Clide DeutscherEugene C, PA-C  clopidogrel (PLAVIX) 75 MG tablet Take 1 tablet (75 mg total) by mouth daily. 11/23/17  Yes Jonelle SidleMcDowell, Samuel G, MD  fluticasone [facility](FLONASE) 50 MCG/ACT nasal spray Place 2 sprays into both nostrils at bedtime.   Yes [provider]  hydrALAZINE (APRESOLINE) 100 MG tablet TAKE 1 TABLET (100 MG TOTAL) BY MOUTH 3 (  THREE) TIMES DAILY. 03/16/18  Yes Jonelle Sidle, MD  hydrochlorothiazide (HYDRODIURIL) 25 MG tablet TAKE 1 TABLET BY MOUTH EVERY DAY 04/02/17  Yes Jonelle Sidle, MD  HYDROcodone-acetaminophen (NORCO) 7.5-325 MG tablet Take 1 tablet by mouth every 6 (six) hours as needed for moderate pain.   Yes [provider]  isosorbide mononitrate (IMDUR) 60 MG 24 hr tablet Take 1 tablet (60 mg total) by mouth daily. 11/23/17  Yes Jonelle Sidle, MD  losartan (COZAAR) 100 MG tablet Take 1 tablet (100 mg total) by mouth daily. 11/23/17  Yes Jonelle Sidle, MD  magnesium oxide (MAG-OX) 400 (241.3 Mg) MG tablet Take 400 mg by mouth daily.  06/03/16  Yes [provider]  metoprolol tartrate (LOPRESSOR) 25 MG tablet TAKE 1 TABLET BY MOUTH TWICE A DAY 03/12/18  Yes Jonelle Sidle, MD  ranitidine (ZANTAC) 300 MG tablet Take 300 mg by mouth at bedtime.   Yes [provider]  simvastatin (ZOCOR) 40 MG tablet TAKE 1 TABLET BY MOUTH EVERYDAY AT BEDTIME 11/23/17  Yes Jonelle Sidle, MD  spironolactone (ALDACTONE) 25 MG tablet Take 0.5 tablets (12.5 mg total) by mouth daily. 01/23/18 01/23/19 Yes Azalee Course, PA  amLODipine (NORVASC) 5 MG tablet Take 5 mg by mouth daily. 01/31/18   [provider]  meclizine (ANTIVERT) 25 MG tablet Take 1 tablet (25 mg  total) by mouth 3 (three) times daily. Patient not taking: Reported on 03/01/2018 01/24/18   Erick Blinks, MD  nitroGLYCERIN (NITROSTAT) 0.4 MG SL tablet Place 1 tablet (0.4 mg total) under the tongue every 5 (five) minutes x 3 doses as needed for chest pain. 02/05/16   Jonelle Sidle, MD    ALLERGY: No Known Allergies  Social History   Tobacco Use  . Smoking status: Former Smoker    Packs/day: 0.75    Years: 43.00    Pack years: 32.25    Types: Cigarettes    Start date: 09/13/1965    Last attempt to quit: 10/14/2007    Years since quitting: 10.4  . Smokeless tobacco: Never Used  Substance Use Topics  . Alcohol use: Yes    Alcohol/week: 0.0 oz    Comment: Occasional wine or beer     Family History  Problem Relation Age of Onset  . Hypertension Mother   . Heart attack Father   . Hypertension Brother      ROS   ROS  Exam   Vitals:   03/18/18 1016 03/18/18 1019  BP:  123/61  Pulse:  80  Resp:  18  Temp: 98.6 F (37 C)   SpO2:  96%   General appearance: WDWN Eyes: PERRL, Fundoscopic: normal Cardiovascular: Regular rate and rhythm without murmurs, rubs, gallops. No edema or variciosities. Distal pulses normal. Pulmonary: Clear to auscultation Musculoskeletal:     Muscle tone upper extremities: Normal    Muscle tone lower extremities: Normal    Motor exam: Upper Extremities Deltoid Bicep Tricep Grip  Right 5/5 5/5 5/5 5/5  Left 5/5 5/5 5/5 5/5   Lower Extremity IP Quad PF DF EHL  Right 5/5 5/5 5/5 5/5 5/5  Left 5/5 5/5 5/5 5/5 5/5   Neurological Awake, alert, oriented Memory and concentration grossly intact Speech fluent, appropriate CNII: Visual fields normal CNIII/IV/VI: EOMI CNV: Facial sensation normal CNVII: Symmetric, normal strength CNVIII: Grossly normal CNIX: Normal palate movement CNXI: Trap and SCM strength normal CN XII: Tongue protrusion normal Sensation grossly intact to LT DTR: Normal Coordination (  finger/nose & heel/shin):  Normal  Results - Imaging/Labs   Results for orders placed or performed during the hospital encounter of 03/18/18 (from the past 48 hour(s))  APTT     Status: None   Collection Time: 03/18/18  9:59 AM  Result Value Ref Range   aPTT 25 24 - 36 seconds    Comment: Performed at [facility], [address] [place], [place] [address]  Protime-INR     Status: None   Collection Time: 03/18/18  9:59 AM  Result Value Ref Range   Prothrombin Time 12.1 11.4 - 15.2 seconds   INR 0.91     Comment: Performed at [facility], [address] [place], [place] [address]  Platelet inhibition p2y12 (Not at [facility])     Status: Abnormal   Collection Time: 03/18/18  9:59 AM  Result Value Ref Range   Platelet Function  P2Y12 179 (L) 194 - 418 PRU    Comment:        The literature has shown a direct correlation of PRU values over 230 with higher risks of thrombotic events.  Lower PRU values are associated with platelet inhibition. Performed at [facility], [address] [place], [place] [address]     No results found.  Impression/Plan   67 y.o. female multiple vascular risk factors with approximately 8 millimeter unruptured basilar aneurysm.  She does also harbor a 3.5 centimeter AAA and significant right common iliac stenosis which will eventually need treatment. We will plan on proceeding with coil embolization of the basilar aneurysm. While in the office, Dr Conchita Paris discussed in detail the treatment options for the aneurysm.  I talked to them about surgical clipping, as well as endovascular coiling with the possibility of stent support.  Given the location of the aneurysm, I recommended a possible stent supported coil embolization.  Patient is already on aspirin and Plavix.  The risks of the procedure were explained in detail including risk of stroke or hemorrhage during or immediately after the procedure.  I also reviewed other risks of procedure including contrast reaction,  nephropathy, or groin hematoma.   In addition, I talked to them about the general risks of anesthesia including heart attack, stroke, or DVT/PE.  The patient understood our discussion and is willing to proceed. All the patient's questions were answered.

## 2018-03-18 NOTE — Anesthesia Postprocedure Evaluation (Signed)
Anesthesia Post Note  Patient: Heather Livingston  Procedure(s) Performed: coil embolization of basilar aneurysm (N/A )     Patient location during evaluation: PACU Anesthesia Type: General Level of consciousness: awake and alert, oriented and patient cooperative Pain management: pain level controlled Vital Signs Assessment: post-procedure vital signs reviewed and stable Respiratory status: spontaneous breathing, nonlabored ventilation, respiratory function stable and patient connected to nasal cannula oxygen Cardiovascular status: blood pressure returned to baseline and stable Postop Assessment: no apparent nausea or vomiting Anesthetic complications: no    Last Vitals:  Vitals:   03/18/18 2000 03/18/18 2015  BP: 117/64   Pulse: 79 81  Resp: 14 (!) 21  Temp:    SpO2: 93% 95%    Last Pain:  Vitals:   03/18/18 1930  TempSrc:   PainSc: 4                  Jonne Rote,E. Amanii Snethen

## 2018-03-18 NOTE — Anesthesia Procedure Notes (Signed)
Procedure Name: Intubation Date/Time: 03/18/2018 12:50 PM Performed by: Adria Dillonkin, Hosie Sharman A, CRNA Pre-anesthesia Checklist: Patient identified, Emergency Drugs available, Suction available and Patient being monitored Patient Re-evaluated:Patient Re-evaluated prior to induction Oxygen Delivery Method: Circle system utilized Preoxygenation: Pre-oxygenation with 100% oxygen Induction Type: IV induction Ventilation: Mask ventilation without difficulty and Oral airway inserted - appropriate to patient size Laryngoscope Size: Miller and 3 Grade View: Grade II Tube type: Oral Tube size: 7.5 mm Number of attempts: 1 Airway Equipment and Method: Stylet Placement Confirmation: ETT inserted through vocal cords under direct vision,  positive ETCO2,  CO2 detector and breath sounds checked- equal and bilateral Secured at: 21 cm Tube secured with: Tape Dental Injury: Teeth and Oropharynx as per pre-operative assessment

## 2018-03-19 ENCOUNTER — Encounter (HOSPITAL_COMMUNITY): Payer: Self-pay | Admitting: Neurosurgery

## 2018-03-19 MED ORDER — ORAL CARE MOUTH RINSE: 15.0000 mL | Freq: Two times a day (BID) | OROMUCOSAL | Status: DC

## 2018-03-19 MED ORDER — OXYCODONE-ACETAMINOPHEN 5-325 MG PO TABS: 1.0000 | ORAL_TABLET | ORAL | 0 refills | Status: DC | PRN

## 2018-03-19 NOTE — Discharge Summary (Signed)
Physician Discharge Summary  Patient ID: Heather Livingston MRN: [id number] DOB/AGE: 1951-10-10 67 y.o.  Admit date: 03/18/2018 Discharge date: 03/19/2018  Admission Diagnoses:  Basilar artery aneurysm  Discharge Diagnoses:  Same Active Problems:   Basilar artery aneurysm [facility])   Cerebral aneurysm, nonruptured  Discharged Condition: Stable  Hospital Course:  Heather Livingston is a 67 y.o. female who was admitted for the below procedure. There were no post operative complications. At time of discharge, pain was well controlled, ambulating with Pt/OT, tolerating po, voiding normal. Ready for discharge.  Treatments: Surgery IR - basilar aneurysm coiling  Discharge Exam: Blood pressure 118/66, pulse 66, temperature 97.9 F (36.6 C), temperature source Oral, resp. rate (!) 9, height 5' 5.5" (1.664 m), weight 102.2 kg (225 lb 5 oz), SpO2 97 %. Awake, alert, oriented Speech fluent, appropriate CN grossly intact 5/5 BUE/BLE Wound c/d/i  Disposition: Discharge disposition: 01-Home or Self Care       Discharge Instructions    Call MD for:  difficulty breathing, headache or visual disturbances   Complete by:  As directed    Call MD for:  persistant dizziness or light-headedness   Complete by:  As directed    Call MD for:  redness, tenderness, or signs of infection (pain, swelling, redness, odor or green/yellow discharge around incision site)   Complete by:  As directed    Call MD for:  severe uncontrolled pain   Complete by:  As directed    Call MD for:  temperature >100.4   Complete by:  As directed    Diet general   Complete by:  As directed    Driving Restrictions   Complete by:  As directed    Do not drive until given clearance.   Increase activity slowly   Complete by:  As directed    Lifting restrictions   Complete by:  As directed    Do not lift anything >10lbs. Avoid bending and twisting in awkward positions. Avoid bending at the back.   May shower / Bathe   Complete  by:  As directed    In 24 hours. Okay to wash wound with warm soapy water. Avoid scrubbing the wound. Pat dry.   Remove dressing in 24 hours   Complete by:  As directed      Allergies as of 03/19/2018   No Known Allergies     Medication List    STOP taking these medications   HYDROcodone-acetaminophen 7.5-325 MG tablet Commonly known as:  NORCO     TAKE these medications   amLODipine 10 MG tablet Commonly known as:  NORVASC Take 1 tablet (10 mg total) by mouth daily.   amLODipine 5 MG tablet Commonly known as:  NORVASC Take 5 mg by mouth daily.   aspirin EC 81 MG tablet Take 1 tablet (81 mg total) by mouth daily.   clopidogrel 75 MG tablet Commonly known as:  PLAVIX Take 1 tablet (75 mg total) by mouth daily.   fluticasone 50 MCG/ACT nasal spray Commonly known as:  FLONASE Place 2 sprays into both nostrils at bedtime.   hydrALAZINE 100 MG tablet Commonly known as:  APRESOLINE TAKE 1 TABLET (100 MG TOTAL) BY MOUTH 3 (THREE) TIMES DAILY.   hydrochlorothiazide 25 MG tablet Commonly known as:  HYDRODIURIL TAKE 1 TABLET BY MOUTH EVERY DAY   isosorbide mononitrate 60 MG 24 hr tablet Commonly known as:  IMDUR Take 1 tablet (60 mg total) by mouth daily.   losartan 100 MG tablet  Commonly known as:  COZAAR Take 1 tablet (100 mg total) by mouth daily.   magnesium oxide 400 (241.3 Mg) MG tablet Commonly known as:  MAG-OX Take 400 mg by mouth daily.   meclizine 25 MG tablet Commonly known as:  ANTIVERT Take 1 tablet (25 mg total) by mouth 3 (three) times daily.   metoprolol tartrate 25 MG tablet Commonly known as:  LOPRESSOR TAKE 1 TABLET BY MOUTH TWICE A DAY   nitroGLYCERIN 0.4 MG SL tablet Commonly known as:  NITROSTAT Place 1 tablet (0.4 mg total) under the tongue every 5 (five) minutes x 3 doses as needed for chest pain.   oxyCODONE-acetaminophen 5-325 MG tablet Commonly known as:  PERCOCET Take 1 tablet by mouth every 4 (four) hours as needed for severe  pain.   ranitidine 300 MG tablet Commonly known as:  ZANTAC Take 300 mg by mouth at bedtime.   simvastatin 40 MG tablet Commonly known as:  ZOCOR TAKE 1 TABLET BY MOUTH EVERYDAY AT BEDTIME   spironolactone 25 MG tablet Commonly known as:  ALDACTONE Take 0.5 tablets (12.5 mg total) by mouth daily.   VENTOLIN HFA 108 (90 Base) MCG/ACT inhaler Generic drug:  albuterol Inhale 1-2 puffs into the lungs every 6 (six) hours as needed for shortness of breath.      Follow-up Information    Lisbeth RenshawNundkumar, Neelesh, MD. Schedule an appointment as soon as possible for a visit in 3 week(s).   Specialty:  Neurosurgery Contact information: [address] [place] [address] [phone]           Signed: Alyson InglesCOSTELLA, Deleah Tison J 03/19/2018, 8:44 AM

## 2018-03-29 ENCOUNTER — Other Ambulatory Visit: Payer: Self-pay | Admitting: *Deleted

## 2018-03-29 ENCOUNTER — Telehealth: Payer: Self-pay | Admitting: *Deleted

## 2018-03-29 ENCOUNTER — Encounter: Payer: Self-pay | Admitting: Surgery

## 2018-03-29 NOTE — Telephone Encounter (Signed)
Call to patient instructed to be at [facility] admitting department at 6:30 am on 05/05/18 for surgery. NPO past MN and follow the detailed instructions received from the hospital pre-admission department about this surgery. Instructed to continue ASA but to hold Plavix for 5 days pre-op. Verbalized understanding.

## 2018-04-05 ENCOUNTER — Other Ambulatory Visit: Payer: Self-pay | Admitting: Cardiology

## 2018-04-07 NOTE — Progress Notes (Signed)
Cardiology Office Note  Date: 04/08/2018   ID: Heather Livingston, Heather Livingston 10-05-1951, MRN [id number]  PCP: Theodoro Kos, MD  Primary Cardiologist: Nona Dell, MD   Chief Complaint  Patient presents with  . Coronary Artery Disease    History of Present Illness: Heather Livingston is a 67 y.o. female last seen in April.  She is here for a routine follow-up visit.  A cardiac perspective she does not report any angina symptoms on current medical regimen. 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  She is next to undergo endovascular stent grafting of her abdominal aortic aneurysm by Dr. Myra Gianotti in July.  Current cardiac regimen includes aspirin, Plavix, hydralazine, HCTZ, Imdur, Cozaar, Lopressor, Aldactone, Zocor, and as needed nitroglycerin.  Cardiac catheterization in April of this year revealed patent bypass grafts with the exception of an occluded SVG to OM but only 50% mid circumflex stenosis.  Medical therapy is being pursued.  Past Medical History:  Diagnosis Date  . Arthritis    back  . Bradycardia   . Brain aneurysm   . Cataracts, bilateral   . Coronary atherosclerosis of native coronary artery    DES circ 10/10, residual 100% occluded SVG to OM, patent LIMA to LAD and SVG to PDA grafts, LVEF 50%  . GERD (gastroesophageal reflux disease)   . History of hiatal hernia   . Hyperlipemia   . Hypertension   . Low iron   . MI (myocardial infarction) (HCC)   . Nonruptured cerebral aneurysm   . OSA (obstructive sleep apnea)    On CPAP  . Palpitations    Transient PAT/AF, CHADS2 score 1  . Pneumonia   . Restless legs   . Stroke [facility])    found on CT scan, pt not aware she had had them    Past Surgical History:  Procedure Laterality Date  . ABDOMINAL AORTOGRAM N/A 01/19/2018   Procedure: ABDOMINAL AORTOGRAM;  Surgeon: Corky Crafts, MD;  Location: [facility] INVASIVE CV LAB;  Service: Cardiovascular;  Laterality: N/A;  . ABDOMINAL  HYSTERECTOMY    . CARPAL TUNNEL RELEASE    . CORONARY ARTERY BYPASS GRAFT  2009   [facility], LIMA to LAD, SVG to OM, SVG to PDA  . IR ANGIO INTRA EXTRACRAN SEL INTERNAL CAROTID BILAT MOD SED  02/23/2018  . IR ANGIO VERTEBRAL SEL VERTEBRAL BILAT MOD SED  02/23/2018  . IR ANGIO VERTEBRAL SEL VERTEBRAL UNI L MOD SED  03/18/2018  . IR ANGIOGRAM FOLLOW UP STUDY  03/18/2018  . IR ANGIOGRAM FOLLOW UP STUDY  03/18/2018  . IR ANGIOGRAM FOLLOW UP STUDY  03/18/2018  . IR ANGIOGRAM FOLLOW UP STUDY  03/18/2018  . IR TRANSCATH/EMBOLIZ  03/18/2018  . LEFT HEART CATH AND CORS/GRAFTS ANGIOGRAPHY N/A 01/19/2018   Procedure: LEFT HEART CATH AND CORS/GRAFTS ANGIOGRAPHY;  Surgeon: Corky Crafts, MD;  Location[facility] INVASIVE CV LAB;  Service: Cardiovascular;  Laterality: N/A;  . LEFT HEART CATHETERIZATION WITH CORONARY/GRAFT ANGIOGRAM N/A 12/27/2012   Procedure: LEFT HEART CATHETERIZATION WITH Isabel Caprice;  Surgeon: Herby Abraham, MD;  Location: [facility] CATH LAB;  Service: Cardiovascular;  Laterality: N/A;  . RADIOLOGY WITH ANESTHESIA N/A 03/18/2018   Procedure: coil embolization of basilar aneurysm;  Surgeon: Lisbeth Renshaw, MD;  Location: [facility] OR;  Service: Radiology;  Laterality: N/A;    Current Outpatient Medications  Medication Sig Dispense Refill  . albuterol (VENTOLIN HFA) 108 (90 Base) MCG/ACT inhaler Inhale 1-2 puffs  into the lungs every 6 (six) hours as needed for shortness of breath.     Marland Kitchen. amLODipine (NORVASC) 5 MG tablet Take 5 mg by mouth daily.  1  . aspirin EC 81 MG tablet Take 1 tablet (81 mg total) by mouth daily.    . clopidogrel (PLAVIX) 75 MG tablet Take 1 tablet (75 mg total) by mouth daily. 90 tablet 1  . fluticasone (FLONASE) 50 MCG/ACT nasal spray Place 2 sprays into both nostrils at bedtime.    . hydrALAZINE (APRESOLINE) 100 MG tablet TAKE 1 TABLET (100 MG TOTAL) BY MOUTH 3 (THREE) TIMES DAILY. 90 tablet 1  . hydrochlorothiazide (HYDRODIURIL) 25 MG tablet TAKE 1 TABLET BY MOUTH EVERY  DAY 90 tablet 3  . isosorbide mononitrate (IMDUR) 60 MG 24 hr tablet Take 1 tablet (60 mg total) by mouth daily. 90 tablet 1  . losartan (COZAAR) 100 MG tablet Take 1 tablet (100 mg total) by mouth daily. 90 tablet 1  . magnesium oxide (MAG-OX) 400 (241.3 Mg) MG tablet Take 400 mg by mouth daily.   3  . metoprolol tartrate (LOPRESSOR) 25 MG tablet TAKE 1 TABLET BY MOUTH TWICE A DAY 60 tablet 6  . nitroGLYCERIN (NITROSTAT) 0.4 MG SL tablet Place 1 tablet (0.4 mg total) under the tongue every 5 (five) minutes x 3 doses as needed for chest pain. 25 tablet 3  . oxyCODONE-acetaminophen (PERCOCET) 5-325 MG tablet Take 1 tablet by mouth every 4 (four) hours as needed for severe pain. 20 tablet 0  . ranitidine (ZANTAC) 300 MG tablet Take 300 mg by mouth at bedtime.    . simvastatin (ZOCOR) 40 MG tablet TAKE 1 TABLET BY MOUTH EVERYDAY AT BEDTIME 90 tablet 1  . spironolactone (ALDACTONE) 25 MG tablet Take 0.5 tablets (12.5 mg total) by mouth daily. 30 tablet 11   No current facility-administered medications for this visit.    Allergies:  Patient has no known allergies.   Social History: The patient  reports that she quit smoking about 10 years ago. Her smoking use included cigarettes. She started smoking about 52 years ago. She has a 32.25 pack-year smoking history. She has never used smokeless tobacco. She reports that she drinks alcohol. She reports that she does not use drugs.   ROS:  Please see the history of present illness. Otherwise, complete review of systems is positive for intermittent headaches.  All other systems are reviewed and negative.   Physical Exam: VS:  BP 140/72   Pulse 64   Ht 5' 5.5" (1.664 m)   Wt 214 lb (97.1 kg)   SpO2 98%   BMI 35.07 kg/m , BMI Body mass index is 35.07 kg/m.  Wt Readings from Last 3 Encounters:  04/08/18 214 lb (97.1 kg)  03/18/18 225 lb 5 oz (102.2 kg)  03/10/18 215 lb 6.4 oz (97.7 kg)    General: Patient appears comfortable at rest. HEENT:  Conjunctiva and lids normal, oropharynx clear. Neck: Supple, no elevated JVP or carotid bruits, no thyromegaly. Lungs: Clear to auscultation, nonlabored breathing at rest. Cardiac: Regular rate and rhythm, no S3 or significant systolic murmur, no pericardial rub. Abdomen: Soft, nontender, bowel sounds present. Extremities: No pitting edema, distal pulses 2+. Skin: Warm and dry. Musculoskeletal: No kyphosis. Neuropsychiatric: Alert and oriented x3, affect grossly appropriate.  ECG: I personally reviewed the tracing from 01/23/2018 which showed sinus rhythm with probable old inferior infarct pattern and nonspecific ST-T changes.  Recent Labwork: 01/23/2018: Magnesium 2.0 03/10/2018: ALT 24; AST 24;  BUN 17; Creatinine, Ser 0.80; Hemoglobin 15.1; Platelets 241; Potassium 3.7; Sodium 139     Component Value Date/Time   CHOL 85 01/20/2018 0206   TRIG 162 (H) 01/20/2018 0206   HDL 25 (L) 01/20/2018 0206   CHOLHDL 3.4 01/20/2018 0206   VLDL 32 01/20/2018 0206   LDLCALC 28 01/20/2018 0206    Other Studies Reviewed Today:  Cardiac catheterization 01/19/2018:  Prox RCA lesion is 100% stenosed. SVG to PDA is patent.  Prox LAD lesion is 100% stenosed. LIMA to LAD is patent.  Mid LM to Dist LM lesion is 25% stenosed.  Mid Cx lesion is 50% stenosed. SVG to OM is occluded, known from prior.  The left ventricular systolic function is normal.  LV end diastolic pressure is mildly elevated.  The left ventricular ejection fraction is 50-55% by visual estimate.  There is no aortic valve stenosis.  Infrarenal AAA. PAD including right common iliac artery stenosis.  Bilateral common iliac arteries appear ectatic.  Medical therapy for CAD. Stable from prior cath. No acute lesion detected.  Assessment and Plan:  1.  Multivessel CAD status post CABG with previous DES to the circumflex.  Cardiac catheterization in April is noted above with plan for medical therapy.  She does not report any  progressive angina at this time on current regimen.  No changes were made today.  2.  Abdominal aortic aneurysm with plan for endovascular stent repair by Dr. Myra Gianotti in July. She should be able to proceed from a cardiac perspective, Plavix could be held temporarily if needed.  3.  Basilar artery aneurysm status post coiling by IR.  4.  Essential hypertension, continue with current regimen.  Current medicines were reviewed with the patient today.  Disposition: Follow-up in 4 months.  Signed, Jonelle Sidle, MD, [facility] 04/08/2018 9:18 AM    [facility] at [facility] [address] [place], [place], [place] [address] Phone: [phone]; Fax: [phone]

## 2018-04-08 ENCOUNTER — Encounter: Payer: Self-pay | Admitting: Cardiology

## 2018-04-08 ENCOUNTER — Ambulatory Visit (INDEPENDENT_AMBULATORY_CARE_PROVIDER_SITE_OTHER): Payer: Medicare Other | Admitting: Cardiology

## 2018-04-08 VITALS — BP 140/72 | HR 64 | Ht 65.5 in | Wt 214.0 lb

## 2018-04-08 DIAGNOSIS — I714 Abdominal aortic aneurysm, without rupture, unspecified: Secondary | ICD-10-CM

## 2018-04-08 DIAGNOSIS — I25119 Atherosclerotic heart disease of native coronary artery with unspecified angina pectoris: Secondary | ICD-10-CM | POA: Diagnosis not present

## 2018-04-08 DIAGNOSIS — I10 Essential (primary) hypertension: Secondary | ICD-10-CM | POA: Diagnosis not present

## 2018-04-08 DIAGNOSIS — I200 Unstable angina: Secondary | ICD-10-CM | POA: Diagnosis not present

## 2018-04-08 NOTE — Patient Instructions (Signed)
Medication Instructions:  Your physician recommends that you continue on your current medications as directed. Please refer to the Current Medication list given to you today.  Labwork: NONE  Testing/Procedures: NONE  Follow-Up: Your physician wants you to follow-up in: 4 MONTHS WITH DR. MCDOWELL. You will receive a reminder letter in the mail two months in advance. If you don't receive a letter, please call our office to schedule the follow-up appointment.  Any Other Special Instructions Will Be Listed Below (If Applicable).  If you need a refill on your cardiac medications before your next appointment, please call your pharmacy. 

## 2018-04-14 ENCOUNTER — Other Ambulatory Visit: Payer: Self-pay | Admitting: Cardiology

## 2018-04-23 NOTE — Pre-Procedure Instructions (Signed)
Heather Livingston  04/23/2018      [facility] - [place] - [address] [place] [address] Phone: [phone] Fax: [phone]  [id number] - [place], [place] - [address] [place] [address] Phone: [phone] Fax: [phone]    Your procedure is scheduled on Wednesday July 24.  Report to [facility] Admitting at 6:30 A.M.  Call this number if you have problems the morning of surgery:  [phone]   Remember:  Do not eat or drink after midnight.    Take these medicines the morning of surgery with A SIP OF WATER:   Amlodipine (norvasc) Hydralazine (apresoline) Isosorbide (imdur) Metoprolol (lopressor)  Oxycodone (percocet) if needed OR Hydrocodone (Norco) if needed Albuterol (please bring inhaler to hospital with you)  7 days prior to surgery STOP taking any Aleve, Naproxen, Ibuprofen, Motrin, Advil, Goody's, BC's, all herbal medications, fish oil, and all vitamins  **Follow your surgeon's instructions on stopping Aspirin and Plavix.**     Do not wear jewelry, make-up or nail polish.  Do not wear lotions, powders, or perfumes, or deodorant.  Do not shave 48 hours prior to surgery.  Men may shave face and neck.  Do not bring valuables to the hospital.  [facility] is not responsible for any belongings or valuables.  Contacts, dentures or bridgework may not be worn into surgery.  Leave your suitcase in the car.  After surgery it may be brought to your room.  For patients admitted to the hospital, discharge time will be determined by your treatment team.  Patients discharged the day of surgery will not be allowed to drive home.   Special instructions:    [place]- Preparing For Surgery  Before surgery, you can play an important role. Because skin is not sterile, your skin needs to be as free of germs as possible. You can reduce the number of germs on your  skin by washing with CHG (chlorahexidine gluconate) Soap before surgery.  CHG is an antiseptic cleaner which kills germs and bonds with the skin to continue killing germs even after washing.    Oral Hygiene is also important to reduce your risk of infection.  Remember - BRUSH YOUR TEETH THE MORNING OF SURGERY WITH YOUR REGULAR TOOTHPASTE  Please do not use if you have an allergy to CHG or antibacterial soaps. If your skin becomes reddened/irritated stop using the CHG.  Do not shave (including legs and underarms) for at least 48 hours prior to first CHG shower. It is OK to shave your face.  Please follow these instructions carefully.   1. Shower the NIGHT BEFORE SURGERY and the MORNING OF SURGERY with CHG.   2. If you chose to wash your hair, wash your hair first as usual with your normal shampoo.  3. After you shampoo, rinse your hair and body thoroughly to remove the shampoo.  4. Use CHG as you would any other liquid soap. You can apply CHG directly to the skin and wash gently with a scrungie or a clean washcloth.   5. Apply the CHG Soap to your body ONLY FROM THE NECK DOWN.  Do not use on open wounds or open sores. Avoid contact with your eyes, ears, mouth and genitals (private parts). Wash Face and genitals (private parts)  with your normal soap.  6. Wash thoroughly, paying special attention to the area where your surgery will be performed.  7. Thoroughly rinse your body  with warm water from the neck down.  8. DO NOT shower/wash with your normal soap after using and rinsing off the CHG Soap.  9. Pat yourself dry with a CLEAN TOWEL.  10. Wear CLEAN PAJAMAS to bed the night before surgery, wear comfortable clothes the morning of surgery  11. Place CLEAN SHEETS on your bed the night of your first shower and DO NOT SLEEP WITH PETS.    Day of Surgery:  Do not apply any deodorants/lotions.  Please wear clean clothes to the hospital/surgery center.   Remember to brush your teeth WITH  YOUR REGULAR TOOTHPASTE.    Please read over the following fact sheets that you were given. Coughing and Deep Breathing, MRSA Information and Surgical Site Infection Prevention

## 2018-04-27 ENCOUNTER — Encounter (HOSPITAL_COMMUNITY)
Admission: RE | Admit: 2018-04-27 | Discharge: 2018-04-27 | Disposition: A | Discharge status: Home / Self Care | Payer: Medicare Other | Source: Ambulatory Visit | Attending: Surgery | Admitting: Surgery

## 2018-04-27 ENCOUNTER — Other Ambulatory Visit: Payer: Self-pay

## 2018-04-27 ENCOUNTER — Encounter (HOSPITAL_COMMUNITY): Payer: Self-pay

## 2018-04-27 DIAGNOSIS — I10 Essential (primary) hypertension: Secondary | ICD-10-CM | POA: Diagnosis not present

## 2018-04-27 DIAGNOSIS — I350 Nonrheumatic aortic (valve) stenosis: Secondary | ICD-10-CM | POA: Diagnosis not present

## 2018-04-27 DIAGNOSIS — Z01812 Encounter for preprocedural laboratory examination: Secondary | ICD-10-CM | POA: Diagnosis not present

## 2018-04-27 DIAGNOSIS — I714 Abdominal aortic aneurysm, without rupture: Secondary | ICD-10-CM | POA: Diagnosis not present

## 2018-04-27 DIAGNOSIS — Z8673 Personal history of transient ischemic attack (TIA), and cerebral infarction without residual deficits: Secondary | ICD-10-CM | POA: Diagnosis not present

## 2018-04-27 HISTORY — DX: Abdominal aortic aneurysm, without rupture: I71.4

## 2018-04-27 HISTORY — DX: Abdominal aortic aneurysm, without rupture, unspecified: I71.40

## 2018-04-27 LAB — COMPREHENSIVE METABOLIC PANEL
ALT: 24 U/L (ref 0–44)
ANION GAP: 11 (ref 5–15)
AST: 25 U/L (ref 15–41)
Albumin: 4.2 g/dL (ref 3.5–5.0)
Alkaline Phosphatase: 59 U/L (ref 38–126)
BILIRUBIN TOTAL: 0.6 mg/dL (ref 0.3–1.2)
BUN: 12 mg/dL (ref 8–23)
CALCIUM: 9.6 mg/dL (ref 8.9–10.3)
CO2: 25 mmol/L (ref 22–32)
Chloride: 104 mmol/L (ref 98–111)
Creatinine, Ser: 0.72 mg/dL (ref 0.44–1.00)
GLUCOSE: 102 mg/dL — AB (ref 70–99)
POTASSIUM: 3.8 mmol/L (ref 3.5–5.1)
Sodium: 140 mmol/L (ref 135–145)
TOTAL PROTEIN: 6.9 g/dL (ref 6.5–8.1)

## 2018-04-27 LAB — SURGICAL PCR SCREEN
MRSA, PCR: NEGATIVE
Staphylococcus aureus: NEGATIVE

## 2018-04-27 LAB — PROTIME-INR
INR: 0.92
Prothrombin Time: 12.3 seconds (ref 11.4–15.2)

## 2018-04-27 LAB — TYPE AND SCREEN
ABO/RH(D): AB POS
ANTIBODY SCREEN: NEGATIVE

## 2018-04-27 LAB — CBC
HCT: 43.3 % (ref 36.0–46.0)
Hemoglobin: 14.4 g/dL (ref 12.0–15.0)
MCH: 32 pg (ref 26.0–34.0)
MCHC: 33.3 g/dL (ref 30.0–36.0)
MCV: 96.2 fL (ref 78.0–100.0)
Platelets: 240 K/uL (ref 150–400)
RBC: 4.5 MIL/uL (ref 3.87–5.11)
RDW: 13.2 % (ref 11.5–15.5)
WBC: 4.8 K/uL (ref 4.0–10.5)

## 2018-04-27 LAB — URINALYSIS, ROUTINE W REFLEX MICROSCOPIC
Bilirubin Urine: NEGATIVE
Glucose, UA: NEGATIVE mg/dL
Hgb urine dipstick: NEGATIVE
Ketones, ur: NEGATIVE mg/dL
Nitrite: NEGATIVE
PROTEIN: NEGATIVE mg/dL
SPECIFIC GRAVITY, URINE: 1.008 (ref 1.005–1.030)
pH: 7 (ref 5.0–8.0)

## 2018-04-27 LAB — APTT: aPTT: 33 s (ref 24–36)

## 2018-04-27 NOTE — Progress Notes (Signed)
PCP - Dr. Early CharsWilliam Livingston  Cardiologist - Dr. Nona DellSamuel Livingston  Chest x-ray - 01/23/18 (E)  EKG - 01/23/18 (E)  Stress Test - 06/23/12 (E)  ECHO - Denies  Cardiac Cath - 01/19/18 (E)  Sleep Study - Yes- Positive CPAP - Yes- Told to bring mask dos  LABS- 04/27/18: CBC, CMP, PT, PTT, T/S, UA  ASA- Continue Plavix- LD- 7/18   Anesthesia- Yes- Cardiac history  Pt denies having chest pain, sob, or fever at this time. All instructions explained to the pt, with a verbal understanding of the material. Pt agrees to go over the instructions while at home for a better understanding. The opportunity to ask questions was provided.

## 2018-04-28 NOTE — Progress Notes (Signed)
Anesthesia Chart Review:  Case:  [id number] Date/Time:  05/05/18 0815   Procedure:  ABDOMINAL AORTIC ENDOVASCULAR STENT GRAFT (N/A )   Anesthesia type:  General   Pre-op diagnosis:  ABDOMINAL AORTIC ANEURYSM   Location:  [facility] OR ROOM 16 / [facility] OR   Surgeon:  Nada LibmanBrabham, Vance W, MD      DISCUSSION: 67 yo female former smoker for above procedure. Pertinent medical hx includes OSA on CPAP, MI, CAD (DES circ 10/10, residual 100% occluded SVG to OM, patent LIMA to LAD and SVG to PDA grafts, LVEF 50%), CVA, hiatal hernia, Palpitations (Transient PAT/AF, CHADS2 score 1), bradycardia, HTN, AAA, s/p coiling of basilar artery aneurysm by IR June 2019 see anesthesia notes in Epic.  Cardiac clearance from Dr. Diona BrownerMcDowell 04/08/2018 "Abdominal aortic aneurysm with plan for endovascular stent repair by Dr. Myra GianottiBrabham in July. She should be able to proceed from a cardiac perspective, Plavix could be held temporarily if needed."  Anticipate she can proceed as planned barring acute status change.  VS: BP 123/61   Pulse 86   Temp 36.7 C   Resp 20   Ht 5' 5.5" (1.664 m)   Wt 213 lb 11.2 oz (96.9 kg)   SpO2 97%   BMI 35.02 kg/m   PROVIDERS: Theodoro KosLewis, William B, MD is PCP last seen 01/25/2018  Willey BladeMcDowell, Samual MD is Cardiologist last seen 04/08/2018  LABS: Labs reviewed: Acceptable for surgery. (all labs ordered are listed, but only abnormal results are displayed)  Labs Reviewed  COMPREHENSIVE METABOLIC PANEL - Abnormal; Notable for the following components:      Result Value   Glucose, Bld 102 (*)    All other components within normal limits  URINALYSIS, ROUTINE W REFLEX MICROSCOPIC - Abnormal; Notable for the following components:   Leukocytes, UA TRACE (*)    Bacteria, UA RARE (*)    All other components within normal limits  SURGICAL PCR SCREEN  APTT  CBC  PROTIME-INR  TYPE AND SCREEN     IMAGES: CHEST - 2 VIEW 01/23/2018  COMPARISON:  01/19/2018  FINDINGS: The heart size and mediastinal contours  are within normal limits. Aortic atherosclerosis. Prior CABG again noted. Both lungs are clear. No evidence of pneumothorax or pleural effusion. The visualized skeletal structures are unremarkable.  IMPRESSION: No active cardiopulmonary disease.   EKG: 01/23/2018: Sinus rhythm with marked sinus arrhythmia.  Inferior infarct, age undetermined.T wave abnormality, consider anterolateral ischemia. Not significantly different from previous tracing.  CV: Cardiac catheterization 01/19/2018:  Prox RCA lesion is 100% stenosed. SVG to PDA is patent.  Prox LAD lesion is 100% stenosed. LIMA to LAD is patent.  Mid LM to Dist LM lesion is 25% stenosed.  Mid Cx lesion is 50% stenosed. SVG to OM is occluded, known from prior.  The left ventricular systolic function is normal.  LV end diastolic pressure is mildly elevated.  The left ventricular ejection fraction is 50-55% by visual estimate.  There is no aortic valve stenosis.  Infrarenal AAA. PAD including right common iliac artery stenosis.  Bilateral common iliac arteries appear ectatic.  Medical therapy for CAD. Stable from prior cath. No acute lesion detected.   Lexiscan 06/23/2012 Notes per Dr. Diona BrownerMcDowell: Reviewed study. This is consistent with known CAD, LV function normal at 57%, some evidence of ischemia in the inferolateral wall, overall mild.   Past Medical History:  Diagnosis Date  . Abdominal aortic aneurysm (AAA) (HCC)   . Arthritis    back  . Bradycardia   .  Brain aneurysm   . Cataracts, bilateral   . Coronary atherosclerosis of native coronary artery    DES circ 10/10, residual 100% occluded SVG to OM, patent LIMA to LAD and SVG to PDA grafts, LVEF 50%  . GERD (gastroesophageal reflux disease)   . History of hiatal hernia   . Hyperlipemia   . Hypertension   . Low iron   . MI (myocardial infarction) (HCC)   . Nonruptured cerebral aneurysm   . OSA (obstructive sleep apnea)    On CPAP  . Palpitations     Transient PAT/AF, CHADS2 score 1  . Pneumonia   . Restless legs   . Stroke [facility])    found on CT scan, pt not aware she had had them    Past Surgical History:  Procedure Laterality Date  . ABDOMINAL AORTOGRAM N/A 01/19/2018   Procedure: ABDOMINAL AORTOGRAM;  Surgeon: Corky Crafts, MD;  Location: [facility] INVASIVE CV LAB;  Service: Cardiovascular;  Laterality: N/A;  . ABDOMINAL HYSTERECTOMY    . CARPAL TUNNEL RELEASE    . CORONARY ARTERY BYPASS GRAFT  2009   [facility], LIMA to LAD, SVG to OM, SVG to PDA  . IR ANGIO INTRA EXTRACRAN SEL INTERNAL CAROTID BILAT MOD SED  02/23/2018  . IR ANGIO VERTEBRAL SEL VERTEBRAL BILAT MOD SED  02/23/2018  . IR ANGIO VERTEBRAL SEL VERTEBRAL UNI L MOD SED  03/18/2018  . IR ANGIOGRAM FOLLOW UP STUDY  03/18/2018  . IR ANGIOGRAM FOLLOW UP STUDY  03/18/2018  . IR ANGIOGRAM FOLLOW UP STUDY  03/18/2018  . IR ANGIOGRAM FOLLOW UP STUDY  03/18/2018  . IR TRANSCATH/EMBOLIZ  03/18/2018  . LEFT HEART CATH AND CORS/GRAFTS ANGIOGRAPHY N/A 01/19/2018   Procedure: LEFT HEART CATH AND CORS/GRAFTS ANGIOGRAPHY;  Surgeon: Corky Crafts, MD;  Location: [facility] INVASIVE CV LAB;  Service: Cardiovascular;  Laterality: N/A;  . LEFT HEART CATHETERIZATION WITH CORONARY/GRAFT ANGIOGRAM N/A 12/27/2012   Procedure: LEFT HEART CATHETERIZATION WITH Isabel Caprice;  Surgeon: Herby Abraham, MD;  Location[facility] C[facility] LAB;  Service: Cardiovascular;  Laterality: N/A;  . RADIOLOGY WITH ANESTHESIA N/A 03/18/2018   Procedure: coil embolization of basilar aneurysm;  Surgeon: Lisbeth Renshaw, MD;  Location: [facility] OR;  Service: Radiology;  Laterality: N/A;    MEDICATIONS: . albuterol (VENTOLIN HFA) 108 (90 Base) MCG/ACT inhaler  . amLODipine (NORVASC) 5 MG tablet  . aspirin EC 81 MG tablet  . clopidogrel (PLAVIX) 75 MG tablet  . fluticasone (FLONASE) 50 MCG/ACT nasal spray  . hydrALAZINE (APRESOLINE) 100 MG tablet  . hydrochlorothiazide (HYDRODIURIL) 25 MG tablet  . HYDROcodone-acetaminophen  (NORCO) 7.5-325 MG tablet  . isosorbide mononitrate (IMDUR) 60 MG 24 hr tablet  . losartan (COZAAR) 100 MG tablet  . magnesium oxide (MAG-OX) 400 (241.3 Mg) MG tablet  . metoprolol tartrate (LOPRESSOR) 25 MG tablet  . nitroGLYCERIN (NITROSTAT) 0.4 MG SL tablet  . oxyCODONE-acetaminophen (PERCOCET) 5-325 MG tablet  . ranitidine (ZANTAC) 300 MG tablet  . simvastatin (ZOCOR) 40 MG tablet  . spironolactone (ALDACTONE) 25 MG tablet   No current facility-administered medications for this encounter.      Zannie Cove [facility] Short Stay Center/Anesthesiology Phone [phone] 04/28/2018 2:25 PM

## 2018-05-05 ENCOUNTER — Inpatient Hospital Stay (HOSPITAL_COMMUNITY)
Admission: RE | Admit: 2018-05-05 | Discharge: 2018-05-06 | DRG: 269 | Disposition: A | Discharge status: Home / Self Care | Payer: Medicare Other | Source: Ambulatory Visit | Attending: Surgery | Admitting: Surgery

## 2018-05-05 ENCOUNTER — Inpatient Hospital Stay (HOSPITAL_COMMUNITY): Payer: Medicare Other | Admitting: Physician Assistant

## 2018-05-05 ENCOUNTER — Encounter (HOSPITAL_COMMUNITY): Payer: Self-pay | Admitting: Urology

## 2018-05-05 ENCOUNTER — Encounter (HOSPITAL_COMMUNITY)
Admission: RE | Disposition: A | Discharge status: Home / Self Care | Payer: Self-pay | Source: Ambulatory Visit | Attending: Surgery

## 2018-05-05 ENCOUNTER — Inpatient Hospital Stay (HOSPITAL_COMMUNITY): Payer: Medicare Other | Admitting: Certified Registered Nurse Anesthetist

## 2018-05-05 ENCOUNTER — Other Ambulatory Visit: Payer: Self-pay

## 2018-05-05 ENCOUNTER — Inpatient Hospital Stay (HOSPITAL_COMMUNITY): Payer: Medicare Other

## 2018-05-05 DIAGNOSIS — M479 Spondylosis, unspecified: Secondary | ICD-10-CM | POA: Diagnosis present

## 2018-05-05 DIAGNOSIS — I714 Abdominal aortic aneurysm, without rupture, unspecified: Secondary | ICD-10-CM | POA: Diagnosis present

## 2018-05-05 DIAGNOSIS — G2581 Restless legs syndrome: Secondary | ICD-10-CM | POA: Diagnosis present

## 2018-05-05 DIAGNOSIS — I708 Atherosclerosis of other arteries: Secondary | ICD-10-CM | POA: Diagnosis present

## 2018-05-05 DIAGNOSIS — K449 Diaphragmatic hernia without obstruction or gangrene: Secondary | ICD-10-CM | POA: Diagnosis present

## 2018-05-05 DIAGNOSIS — E785 Hyperlipidemia, unspecified: Secondary | ICD-10-CM | POA: Diagnosis present

## 2018-05-05 DIAGNOSIS — Z7902 Long term (current) use of antithrombotics/antiplatelets: Secondary | ICD-10-CM | POA: Diagnosis not present

## 2018-05-05 DIAGNOSIS — I2510 Atherosclerotic heart disease of native coronary artery without angina pectoris: Secondary | ICD-10-CM | POA: Diagnosis present

## 2018-05-05 DIAGNOSIS — I252 Old myocardial infarction: Secondary | ICD-10-CM | POA: Diagnosis not present

## 2018-05-05 DIAGNOSIS — Z87891 Personal history of nicotine dependence: Secondary | ICD-10-CM

## 2018-05-05 DIAGNOSIS — Z8249 Family history of ischemic heart disease and other diseases of the circulatory system: Secondary | ICD-10-CM | POA: Diagnosis not present

## 2018-05-05 DIAGNOSIS — Z951 Presence of aortocoronary bypass graft: Secondary | ICD-10-CM | POA: Diagnosis not present

## 2018-05-05 DIAGNOSIS — Z8673 Personal history of transient ischemic attack (TIA), and cerebral infarction without residual deficits: Secondary | ICD-10-CM | POA: Diagnosis not present

## 2018-05-05 DIAGNOSIS — E7800 Pure hypercholesterolemia, unspecified: Secondary | ICD-10-CM | POA: Diagnosis present

## 2018-05-05 DIAGNOSIS — I10 Essential (primary) hypertension: Secondary | ICD-10-CM | POA: Diagnosis present

## 2018-05-05 DIAGNOSIS — K219 Gastro-esophageal reflux disease without esophagitis: Secondary | ICD-10-CM | POA: Diagnosis present

## 2018-05-05 DIAGNOSIS — Z955 Presence of coronary angioplasty implant and graft: Secondary | ICD-10-CM

## 2018-05-05 DIAGNOSIS — Z7982 Long term (current) use of aspirin: Secondary | ICD-10-CM | POA: Diagnosis not present

## 2018-05-05 DIAGNOSIS — G4733 Obstructive sleep apnea (adult) (pediatric): Secondary | ICD-10-CM | POA: Diagnosis present

## 2018-05-05 HISTORY — PX: ABDOMINAL AORTIC ENDOVASCULAR STENT GRAFT: SHX5707

## 2018-05-05 HISTORY — PX: ENDOVASCULAR REPAIR/STENT GRAFT: CATH118280

## 2018-05-05 LAB — POCT ACTIVATED CLOTTING TIME: Activated Clotting Time: 197 seconds

## 2018-05-05 LAB — CBC
HEMATOCRIT: 42.3 % (ref 36.0–46.0)
HEMOGLOBIN: 14.3 g/dL (ref 12.0–15.0)
MCH: 32.2 pg (ref 26.0–34.0)
MCHC: 33.8 g/dL (ref 30.0–36.0)
MCV: 95.3 fL (ref 78.0–100.0)
Platelets: 199 10*3/uL (ref 150–400)
RBC: 4.44 MIL/uL (ref 3.87–5.11)
RDW: 13 % (ref 11.5–15.5)
WBC: 5.2 10*3/uL (ref 4.0–10.5)

## 2018-05-05 LAB — CREATININE, SERUM
Creatinine, Ser: 0.54 mg/dL (ref 0.44–1.00)
GFR calc Af Amer: >60 mL/min (ref >60)
GFR calc non Af Amer: >60 mL/min (ref >60)

## 2018-05-05 SURGERY — INSERTION, ENDOVASCULAR STENT GRAFT, AORTA, ABDOMINAL
Anesthesia: General

## 2018-05-05 MED ORDER — METOPROLOL TARTRATE 5 MG/5ML IV SOLN: 2.0000 mg | INTRAVENOUS | Status: DC | PRN

## 2018-05-05 MED ORDER — CHLORHEXIDINE GLUCONATE 4 % EX LIQD: 60.0000 mL | Freq: Once | CUTANEOUS | Status: DC

## 2018-05-05 MED ORDER — ROCURONIUM BROMIDE 100 MG/10ML IV SOLN
INTRAVENOUS | Status: DC | PRN
  Administered 2018-05-05: 50 mg via INTRAVENOUS
  Administered 2018-05-05: 30 mg via INTRAVENOUS

## 2018-05-05 MED ORDER — MAGNESIUM OXIDE 400 (241.3 MG) MG PO TABS
400.0000 mg | ORAL_TABLET | Freq: Every day | ORAL | Status: DC
  Administered 2018-05-06: 400 mg via ORAL
  Filled 2018-05-05: qty 1

## 2018-05-05 MED ORDER — ALUM & MAG HYDROXIDE-SIMETH 200-200-20 MG/5ML PO SUSP: 15.0000 mL | ORAL | Status: DC | PRN

## 2018-05-05 MED ORDER — MORPHINE SULFATE (PF) 2 MG/ML IV SOLN
0.5000 mg | INTRAVENOUS | Status: DC | PRN
  Administered 2018-05-05 (×2): 1 mg via INTRAVENOUS
  Filled 2018-05-05 (×2): qty 1

## 2018-05-05 MED ORDER — PHENOL 1.4 % MT LIQD: 1.0000 | OROMUCOSAL | Status: DC | PRN

## 2018-05-05 MED ORDER — HYDROMORPHONE HCL 1 MG/ML IJ SOLN
INTRAMUSCULAR | Status: AC
  Filled 2018-05-05: qty 1

## 2018-05-05 MED ORDER — LACTATED RINGERS IV SOLN
INTRAVENOUS | Status: DC
  Administered 2018-05-05: 07:00:00 via INTRAVENOUS

## 2018-05-05 MED ORDER — ROCURONIUM BROMIDE 10 MG/ML (PF) SYRINGE
PREFILLED_SYRINGE | INTRAVENOUS | Status: AC
  Filled 2018-05-05: qty 10

## 2018-05-05 MED ORDER — DOCUSATE SODIUM 100 MG PO CAPS
100.0000 mg | ORAL_CAPSULE | Freq: Every day | ORAL | Status: DC
  Filled 2018-05-05: qty 1

## 2018-05-05 MED ORDER — IODIXANOL 320 MG/ML IV SOLN
INTRAVENOUS | Status: DC | PRN
  Administered 2018-05-05: 150 mL via INTRAVENOUS

## 2018-05-05 MED ORDER — FENTANYL CITRATE (PF) 250 MCG/5ML IJ SOLN
INTRAMUSCULAR | Status: AC
  Filled 2018-05-05: qty 5

## 2018-05-05 MED ORDER — HYDROMORPHONE HCL 1 MG/ML IJ SOLN
INTRAMUSCULAR | Status: AC
  Administered 2018-05-05: 0.5 mg via INTRAVENOUS
  Filled 2018-05-05: qty 1

## 2018-05-05 MED ORDER — CLOPIDOGREL BISULFATE 75 MG PO TABS
75.0000 mg | ORAL_TABLET | Freq: Every day | ORAL | Status: DC
  Administered 2018-05-06: 75 mg via ORAL
  Filled 2018-05-05: qty 1

## 2018-05-05 MED ORDER — HYDROCODONE-ACETAMINOPHEN 7.5-325 MG PO TABS
0.5000 | ORAL_TABLET | Freq: Two times a day (BID) | ORAL | Status: DC | PRN
  Administered 2018-05-05 – 2018-05-06 (×2): 1 via ORAL
  Filled 2018-05-05 (×2): qty 1

## 2018-05-05 MED ORDER — HYDRALAZINE HCL 50 MG PO TABS
100.0000 mg | ORAL_TABLET | Freq: Three times a day (TID) | ORAL | Status: DC
  Administered 2018-05-05 – 2018-05-06 (×3): 100 mg via ORAL
  Filled 2018-05-05 (×3): qty 2

## 2018-05-05 MED ORDER — FENTANYL CITRATE (PF) 100 MCG/2ML IJ SOLN
INTRAMUSCULAR | Status: DC | PRN
  Administered 2018-05-05: 50 ug via INTRAVENOUS
  Administered 2018-05-05: 200 ug via INTRAVENOUS

## 2018-05-05 MED ORDER — PROTAMINE SULFATE 10 MG/ML IV SOLN
INTRAVENOUS | Status: AC
  Filled 2018-05-05: qty 5

## 2018-05-05 MED ORDER — CEFAZOLIN SODIUM-DEXTROSE 2-4 GM/100ML-% IV SOLN
2.0000 g | INTRAVENOUS | Status: AC
  Administered 2018-05-05: 2 g via INTRAVENOUS
  Filled 2018-05-05: qty 100

## 2018-05-05 MED ORDER — BISACODYL 5 MG PO TBEC: 5.0000 mg | DELAYED_RELEASE_TABLET | Freq: Every day | ORAL | Status: DC | PRN

## 2018-05-05 MED ORDER — LIDOCAINE HCL (CARDIAC) PF 100 MG/5ML IV SOSY
PREFILLED_SYRINGE | INTRAVENOUS | Status: DC | PRN
  Administered 2018-05-05: 100 mg via INTRAVENOUS

## 2018-05-05 MED ORDER — LACTATED RINGERS IV SOLN
INTRAVENOUS | Status: DC | PRN
  Administered 2018-05-05: 07:00:00 via INTRAVENOUS

## 2018-05-05 MED ORDER — SUGAMMADEX SODIUM 200 MG/2ML IV SOLN
INTRAVENOUS | Status: DC | PRN
  Administered 2018-05-05 (×4): 50 mg via INTRAVENOUS

## 2018-05-05 MED ORDER — SIMVASTATIN 40 MG PO TABS
40.0000 mg | ORAL_TABLET | Freq: Every day | ORAL | Status: DC
  Administered 2018-05-05: 40 mg via ORAL
  Filled 2018-05-05: qty 1

## 2018-05-05 MED ORDER — EPHEDRINE SULFATE 50 MG/ML IJ SOLN
INTRAMUSCULAR | Status: AC
  Filled 2018-05-05: qty 1

## 2018-05-05 MED ORDER — ALBUTEROL SULFATE HFA 108 (90 BASE) MCG/ACT IN AERS: 1.0000 | INHALATION_SPRAY | Freq: Four times a day (QID) | RESPIRATORY_TRACT | Status: DC | PRN

## 2018-05-05 MED ORDER — POTASSIUM CHLORIDE CRYS ER 20 MEQ PO TBCR: 20.0000 meq | EXTENDED_RELEASE_TABLET | Freq: Every day | ORAL | Status: DC | PRN

## 2018-05-05 MED ORDER — FLUTICASONE PROPIONATE 50 MCG/ACT NA SUSP
2.0000 | Freq: Every day | NASAL | Status: DC
  Administered 2018-05-05: 2 via NASAL
  Filled 2018-05-05: qty 16

## 2018-05-05 MED ORDER — HEPARIN SODIUM (PORCINE) 1000 UNIT/ML IJ SOLN
INTRAMUSCULAR | Status: DC | PRN
  Administered 2018-05-05: 2000 [IU] via INTRAVENOUS
  Administered 2018-05-05: 10000 [IU] via INTRAVENOUS

## 2018-05-05 MED ORDER — PROPOFOL 10 MG/ML IV BOLUS
INTRAVENOUS | Status: AC
  Filled 2018-05-05: qty 40

## 2018-05-05 MED ORDER — 0.9 % SODIUM CHLORIDE (POUR BTL) OPTIME
TOPICAL | Status: DC | PRN
  Administered 2018-05-05: 1000 mL

## 2018-05-05 MED ORDER — ACETAMINOPHEN 325 MG RE SUPP: 325.0000 mg | RECTAL | Status: DC | PRN

## 2018-05-05 MED ORDER — SODIUM CHLORIDE 0.9 % IV SOLN
INTRAVENOUS | Status: DC | PRN
  Administered 2018-05-05: 30 ug/min via INTRAVENOUS
  Administered 2018-05-05: 20 ug/min via INTRAVENOUS

## 2018-05-05 MED ORDER — SODIUM CHLORIDE 0.9 % IV SOLN
INTRAVENOUS | Status: DC | PRN
  Administered 2018-05-05: 08:00:00

## 2018-05-05 MED ORDER — AMLODIPINE BESYLATE 5 MG PO TABS
5.0000 mg | ORAL_TABLET | Freq: Every day | ORAL | Status: DC
  Administered 2018-05-05 – 2018-05-06 (×3): 5 mg via ORAL
  Filled 2018-05-05 (×2): qty 1

## 2018-05-05 MED ORDER — SPIRONOLACTONE 12.5 MG HALF TABLET
12.5000 mg | ORAL_TABLET | Freq: Every day | ORAL | Status: DC
  Administered 2018-05-05 – 2018-05-06 (×2): 12.5 mg via ORAL
  Filled 2018-05-05 (×2): qty 1

## 2018-05-05 MED ORDER — ONDANSETRON HCL 4 MG/2ML IJ SOLN: 4.0000 mg | Freq: Four times a day (QID) | INTRAMUSCULAR | Status: DC | PRN

## 2018-05-05 MED ORDER — DEXAMETHASONE SODIUM PHOSPHATE 10 MG/ML IJ SOLN
INTRAMUSCULAR | Status: AC
  Filled 2018-05-05: qty 1

## 2018-05-05 MED ORDER — MIDAZOLAM HCL 5 MG/5ML IJ SOLN
INTRAMUSCULAR | Status: DC | PRN
  Administered 2018-05-05: 2 mg via INTRAVENOUS

## 2018-05-05 MED ORDER — MIDAZOLAM HCL 2 MG/2ML IJ SOLN
INTRAMUSCULAR | Status: AC
  Filled 2018-05-05: qty 2

## 2018-05-05 MED ORDER — HYDROCHLOROTHIAZIDE 25 MG PO TABS
25.0000 mg | ORAL_TABLET | Freq: Every day | ORAL | Status: DC
  Administered 2018-05-06: 25 mg via ORAL
  Filled 2018-05-05 (×2): qty 1

## 2018-05-05 MED ORDER — LABETALOL HCL 5 MG/ML IV SOLN: 10.0000 mg | INTRAVENOUS | Status: DC | PRN

## 2018-05-05 MED ORDER — MAGNESIUM SULFATE 2 GM/50ML IV SOLN: 2.0000 g | Freq: Every day | INTRAVENOUS | Status: DC | PRN

## 2018-05-05 MED ORDER — NITROGLYCERIN 0.4 MG SL SUBL: 0.4000 mg | SUBLINGUAL_TABLET | SUBLINGUAL | Status: DC | PRN

## 2018-05-05 MED ORDER — LIDOCAINE 2% (20 MG/ML) 5 ML SYRINGE
INTRAMUSCULAR | Status: AC
  Filled 2018-05-05: qty 5

## 2018-05-05 MED ORDER — SODIUM CHLORIDE 0.9 % IV SOLN: INTRAVENOUS | Status: DC

## 2018-05-05 MED ORDER — HEPARIN SODIUM (PORCINE) 1000 UNIT/ML IJ SOLN
INTRAMUSCULAR | Status: AC
  Filled 2018-05-05: qty 1

## 2018-05-05 MED ORDER — GUAIFENESIN-DM 100-10 MG/5ML PO SYRP: 15.0000 mL | ORAL_SOLUTION | ORAL | Status: DC | PRN

## 2018-05-05 MED ORDER — MEPERIDINE HCL 50 MG/ML IJ SOLN: 6.2500 mg | INTRAMUSCULAR | Status: DC | PRN

## 2018-05-05 MED ORDER — PROTAMINE SULFATE 10 MG/ML IV SOLN
INTRAVENOUS | Status: DC | PRN
  Administered 2018-05-05: 40 mg via INTRAVENOUS
  Administered 2018-05-05: 10 mg via INTRAVENOUS

## 2018-05-05 MED ORDER — HYDROMORPHONE HCL 1 MG/ML IJ SOLN
0.2500 mg | INTRAMUSCULAR | Status: DC | PRN
  Administered 2018-05-05 (×4): 0.5 mg via INTRAVENOUS

## 2018-05-05 MED ORDER — SODIUM CHLORIDE 0.9 % IV SOLN: 500.0000 mL | Freq: Once | INTRAVENOUS | Status: DC | PRN

## 2018-05-05 MED ORDER — SODIUM CHLORIDE 0.9 % IJ SOLN
INTRAMUSCULAR | Status: AC
  Filled 2018-05-05: qty 10

## 2018-05-05 MED ORDER — SENNOSIDES-DOCUSATE SODIUM 8.6-50 MG PO TABS: 1.0000 | ORAL_TABLET | Freq: Every evening | ORAL | Status: DC | PRN

## 2018-05-05 MED ORDER — METOPROLOL TARTRATE 25 MG PO TABS
25.0000 mg | ORAL_TABLET | Freq: Two times a day (BID) | ORAL | Status: DC
  Administered 2018-05-05 – 2018-05-06 (×2): 25 mg via ORAL
  Filled 2018-05-05 (×2): qty 1

## 2018-05-05 MED ORDER — HYDRALAZINE HCL 20 MG/ML IJ SOLN: 5.0000 mg | INTRAMUSCULAR | Status: DC | PRN

## 2018-05-05 MED ORDER — ASPIRIN EC 81 MG PO TBEC
81.0000 mg | DELAYED_RELEASE_TABLET | Freq: Every day | ORAL | Status: DC
  Administered 2018-05-06: 81 mg via ORAL
  Filled 2018-05-05: qty 1

## 2018-05-05 MED ORDER — ENOXAPARIN SODIUM 30 MG/0.3ML ~~LOC~~ SOLN: 30.0000 mg | SUBCUTANEOUS | Status: DC

## 2018-05-05 MED ORDER — LABETALOL HCL 5 MG/ML IV SOLN
INTRAVENOUS | Status: DC | PRN
  Administered 2018-05-05: 5 mg via INTRAVENOUS

## 2018-05-05 MED ORDER — PROPOFOL 10 MG/ML IV BOLUS
INTRAVENOUS | Status: DC | PRN
  Administered 2018-05-05: 100 mg via INTRAVENOUS

## 2018-05-05 MED ORDER — CEFAZOLIN SODIUM-DEXTROSE 2-4 GM/100ML-% IV SOLN
2.0000 g | Freq: Three times a day (TID) | INTRAVENOUS | Status: AC
  Administered 2018-05-05 – 2018-05-06 (×2): 2 g via INTRAVENOUS
  Filled 2018-05-05 (×2): qty 100

## 2018-05-05 MED ORDER — LOSARTAN POTASSIUM 50 MG PO TABS
100.0000 mg | ORAL_TABLET | Freq: Every day | ORAL | Status: DC
  Administered 2018-05-05 – 2018-05-06 (×2): 100 mg via ORAL
  Filled 2018-05-05 (×2): qty 2

## 2018-05-05 MED ORDER — DEXAMETHASONE SODIUM PHOSPHATE 10 MG/ML IJ SOLN
INTRAMUSCULAR | Status: DC | PRN
  Administered 2018-05-05: 10 mg via INTRAVENOUS

## 2018-05-05 MED ORDER — ONDANSETRON HCL 4 MG/2ML IJ SOLN
INTRAMUSCULAR | Status: DC | PRN
  Administered 2018-05-05: 4 mg via INTRAVENOUS

## 2018-05-05 MED ORDER — ISOSORBIDE MONONITRATE ER 60 MG PO TB24
60.0000 mg | ORAL_TABLET | Freq: Every day | ORAL | Status: DC
  Administered 2018-05-05 – 2018-05-06 (×2): 60 mg via ORAL
  Filled 2018-05-05 (×2): qty 1

## 2018-05-05 MED ORDER — PANTOPRAZOLE SODIUM 40 MG PO TBEC
40.0000 mg | DELAYED_RELEASE_TABLET | Freq: Every day | ORAL | Status: DC
  Administered 2018-05-05: 40 mg via ORAL
  Filled 2018-05-05: qty 1

## 2018-05-05 MED ORDER — ONDANSETRON HCL 4 MG/2ML IJ SOLN
INTRAMUSCULAR | Status: AC
  Filled 2018-05-05: qty 2

## 2018-05-05 MED ORDER — SODIUM CHLORIDE 0.9 % IV SOLN
INTRAVENOUS | Status: AC
  Filled 2018-05-05: qty 1.2

## 2018-05-05 MED ORDER — ONDANSETRON HCL 4 MG/2ML IJ SOLN: 4.0000 mg | Freq: Once | INTRAMUSCULAR | Status: DC | PRN

## 2018-05-05 MED ORDER — ACETAMINOPHEN 325 MG PO TABS: 325.0000 mg | ORAL_TABLET | ORAL | Status: DC | PRN

## 2018-05-05 MED ORDER — SUGAMMADEX SODIUM 200 MG/2ML IV SOLN
INTRAVENOUS | Status: AC
  Filled 2018-05-05: qty 2

## 2018-05-05 MED ORDER — SODIUM CHLORIDE 0.9 % IV SOLN
INTRAVENOUS | Status: DC
  Administered 2018-05-05 – 2018-05-06 (×2): via INTRAVENOUS

## 2018-05-05 SURGICAL SUPPLY — 68 items
BLADE CLIPPER SURG (BLADE) ×3 IMPLANT
CANISTER SUCT 3000ML PPV (MISCELLANEOUS) ×3 IMPLANT
CATH ANGIO 5F BER2 65CM (CATHETERS) ×3 IMPLANT
CATH BEACON 5.038 65CM KMP-01 (CATHETERS) IMPLANT
CATH OMNI FLUSH .035X70CM (CATHETERS) ×3 IMPLANT
COVER PROBE W GEL 5X96 (DRAPES) ×3 IMPLANT
DERMABOND ADVANCED (GAUZE/BANDAGES/DRESSINGS) ×4
DERMABOND ADVANCED .7 DNX12 (GAUZE/BANDAGES/DRESSINGS) ×2 IMPLANT
DEVICE CLOSURE PERCLS PRGLD 6F (VASCULAR PRODUCTS) ×4 IMPLANT
DEVICE TORQUE H2O (MISCELLANEOUS) ×3 IMPLANT
DRAPE ZERO GRAVITY STERILE (DRAPES) ×3 IMPLANT
DRSG TEGADERM 2-3/8X2-3/4 SM (GAUZE/BANDAGES/DRESSINGS) ×6 IMPLANT
DRSG TEGADERM 4X4.75 (GAUZE/BANDAGES/DRESSINGS) ×6 IMPLANT
DRYSEAL FLEXSHEATH 12FR, 33CM (SHEATH) ×3 IMPLANT
DRYSEAL FLEXSHEATH 16FR 33CM (SHEATH) ×2
ELECT CAUTERY BLADE 6.4 (BLADE) ×3 IMPLANT
ELECT REM PT RETURN 9FT ADLT (ELECTROSURGICAL) ×6
ELECTRODE REM PT RTRN 9FT ADLT (ELECTROSURGICAL) ×2 IMPLANT
EXCLUDER TNK LEG 26MX14X16 (Endovascular Graft) ×1 IMPLANT
EXCLUDER TRUNK LEG 26MX14X16 (Endovascular Graft) ×3 IMPLANT
FILTER CO2 0.2 MICRON (VASCULAR PRODUCTS) IMPLANT
FILTER CO2 INSUFFLATOR AX1008 (MISCELLANEOUS) IMPLANT
GAUZE SPONGE 2X2 8PLY STRL LF (GAUZE/BANDAGES/DRESSINGS) ×2 IMPLANT
GLOVE BIO SURGEON STRL SZ7 (GLOVE) ×3 IMPLANT
GLOVE BIOGEL PI IND STRL 7.0 (GLOVE) ×1 IMPLANT
GLOVE BIOGEL PI IND STRL 7.5 (GLOVE) ×1 IMPLANT
GLOVE BIOGEL PI INDICATOR 7.0 (GLOVE) ×2
GLOVE BIOGEL PI INDICATOR 7.5 (GLOVE) ×2
GLOVE SURG SS PI 7.5 STRL IVOR (GLOVE) ×3 IMPLANT
GOWN STRL REUS W/ TWL LRG LVL3 (GOWN DISPOSABLE) ×2 IMPLANT
GOWN STRL REUS W/ TWL XL LVL3 (GOWN DISPOSABLE) ×1 IMPLANT
GOWN STRL REUS W/TWL LRG LVL3 (GOWN DISPOSABLE) ×4
GOWN STRL REUS W/TWL XL LVL3 (GOWN DISPOSABLE) ×2
GRAFT BALLN CATH 65CM (STENTS) ×1 IMPLANT
GUIDEWIRE ANGLED .035X150CM (WIRE) ×3 IMPLANT
HEMOSTAT SNOW SURGICEL 2X4 (HEMOSTASIS) IMPLANT
KIT BASIN OR (CUSTOM PROCEDURE TRAY) ×3 IMPLANT
KIT TURNOVER KIT B (KITS) ×3 IMPLANT
LEG CONTRALATERAL 16X16X9.5 (Endovascular Graft) ×3 IMPLANT
NEEDLE PERC 18GX7CM (NEEDLE) ×3 IMPLANT
NS IRRIG 1000ML POUR BTL (IV SOLUTION) ×3 IMPLANT
PACK ENDOVASCULAR (PACKS) ×3 IMPLANT
PAD ARMBOARD 7.5X6 YLW CONV (MISCELLANEOUS) ×6 IMPLANT
PENCIL BUTTON HOLSTER BLD 10FT (ELECTRODE) ×3 IMPLANT
PERCLOSE PROGLIDE 6F (VASCULAR PRODUCTS) ×12
SET FLUSH CO2 (MISCELLANEOUS) IMPLANT
SET MICROPUNCTURE 5F STIFF (MISCELLANEOUS) ×3 IMPLANT
SHEATH AVANTI 11CM 8FR (SHEATH) IMPLANT
SHEATH BRITE TIP 8FR 23CM (SHEATH) ×3 IMPLANT
SHEATH DRYSEAL FLEX 16FR 33CM (SHEATH) ×1 IMPLANT
SHEATH PINNACLE 8F 10CM (SHEATH) ×3 IMPLANT
SHIELD RADPAD SCOOP 12X17 (MISCELLANEOUS) ×9 IMPLANT
SPONGE GAUZE 2X2 STER 10/PKG (GAUZE/BANDAGES/DRESSINGS) ×4
STENT GRAFT BALLN CATH 65CM (STENTS) ×2
STOPCOCK MORSE 400PSI 3WAY (MISCELLANEOUS) ×3 IMPLANT
SUT ETHILON 3 0 PS 1 (SUTURE) IMPLANT
SUT PROLENE 5 0 C 1 24 (SUTURE) IMPLANT
SUT VIC AB 2-0 CT1 27 (SUTURE)
SUT VIC AB 2-0 CT1 TAPERPNT 27 (SUTURE) IMPLANT
SUT VIC AB 3-0 SH 27 (SUTURE)
SUT VIC AB 3-0 SH 27X BRD (SUTURE) IMPLANT
SUT VICRYL 4-0 PS2 18IN ABS (SUTURE) IMPLANT
SYR 30ML LL (SYRINGE) ×3 IMPLANT
TOWEL GREEN STERILE (TOWEL DISPOSABLE) ×3 IMPLANT
TRAY FOLEY MTR SLVR 16FR STAT (SET/KITS/TRAYS/PACK) ×3 IMPLANT
TUBING HIGH PRESSURE 120CM (CONNECTOR) ×3 IMPLANT
WIRE AMPLATZ SS-J .035X180CM (WIRE) ×6 IMPLANT
WIRE BENTSON .035X145CM (WIRE) ×6 IMPLANT

## 2018-05-05 NOTE — Consult Note (Addendum)
 [facility] [place]  Patient name: Heather Livingston    MRN: [id number]        DOB: 12/11/1950          Sex: female   REQUESTING PROVIDER:    Dr. Varanasi   REASON FOR CONSULT:    AAA/ R CIA Stenosis  HISTORY OF PRESENT ILLNESS:   Heather Livingston is a 67 y.o. female, who is here today for further discussions regarding her right common iliac stenosis.  I saw the patient while she was in the hospital for heart catheterization.  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  The patient gets severe hip and leg pain with activity such as going up a flight of steps.  This is significantly restricting her quality of life.  She also has some symptoms that are not consistent with vascular disease such as pain in her right hip that will wake her up at night.  She does not have any open wounds.  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  She also has a 8 mm carotid pseudoaneurysm.  Her coronary disease is stable and is to be medically managed.  She does have a history of MI in the past.  She is on dual antiplatelet therapy.  She takes a statin for hypercholesterolemia.  She is medically managed for hypertension.  She is a former smoker.  PAST MEDICAL HISTORY        Past Medical History:  Diagnosis Date  . Bradycardia   . Cataracts, bilateral   . Coronary atherosclerosis of native coronary artery    DES circ 10/10, residual 100% occluded SVG to OM, patent LIMA to LAD and SVG to PDA grafts, LVEF 50%  . GERD (gastroesophageal reflux disease)   . Hyperlipemia   . Hypertension   . MI (myocardial infarction) (HCC)   . OSA (obstructive sleep apnea)    On CPAP  . Palpitations    Transient PAT/AF, CHADS2 score 1     FAMILY HISTORY        Family History  Problem Relation Age of Onset  . Heart attack Father   .  Hypertension Brother     SOCIAL HISTORY:   Social History        Socioeconomic History  . Marital status: Married    Spouse name: Not on file  . Number of children: Not on file  . Years of education: Not on file  . Highest education level: Not on file  Occupational History  . Occupation: Disabled  Social Needs  . Financial resource strain: Not on file  . Food insecurity:    Worry: Not on file    Inability: Not on file  . Transportation needs:    Medical: Not on file    Non-medical: Not on file  Tobacco Use  . Smoking status: Former Smoker    Packs/day: 0.75    Years: 43.00    Pack years: 32.25    Types: Cigarettes    Start date: 09/13/1965    Last attempt to quit: 10/14/2007    Years since quitting: 10.3  . Smokeless tobacco: Never Used  Substance and Sexual Activity  . Alcohol use: Yes    Alcohol/week: 0.0 oz    Comment: Occasional wine or beer  . Drug use: No  . Sexual activity: Not on file  Lifestyle  . Physical activity:      Days per week: Not on file    Minutes per session: Not on file  . Stress: Not on file  Relationships  . Social connections:    Talks on phone: Not on file    Gets together: Not on file    Attends religious service: Not on file    Active member of club or organization: Not on file    Attends meetings of clubs or organizations: Not on file    Relationship status: Not on file  . Intimate partner violence:    Fear of current or ex partner: Not on file    Emotionally abused: Not on file    Physically abused: Not on file    Forced sexual activity: Not on file  Other Topics Concern  . Not on file  Social History Narrative  . Not on file    ALLERGIES:    No Known Allergies  CURRENT MEDICATIONS:          Current Outpatient Medications  Medication Sig Dispense Refill  . albuterol (VENTOLIN HFA) 108 (90 Base) MCG/ACT inhaler Inhale 1-2 puffs into the lungs every 6 (six)  hours as needed for shortness of breath.     . amLODipine (NORVASC) 10 MG tablet Take 1 tablet (10 mg total) by mouth daily.    . aspirin EC 81 MG tablet Take 1 tablet (81 mg total) by mouth daily.    . clopidogrel (PLAVIX) 75 MG tablet Take 1 tablet (75 mg total) by mouth daily. 90 tablet 1  . hydrALAZINE (APRESOLINE) 100 MG tablet Take 1 tablet (100 mg total) by mouth 3 (three) times daily. 90 tablet 1  . hydrochlorothiazide (HYDRODIURIL) 25 MG tablet TAKE 1 TABLET BY MOUTH EVERY DAY 90 tablet 3  . HYDROcodone-acetaminophen (NORCO) 7.5-325 MG tablet Take 1 tablet by mouth every 6 (six) hours as needed for moderate pain.    . isosorbide mononitrate (IMDUR) 60 MG 24 hr tablet Take 1 tablet (60 mg total) by mouth daily. 90 tablet 1  . losartan (COZAAR) 100 MG tablet Take 1 tablet (100 mg total) by mouth daily. 90 tablet 1  . magnesium oxide (MAG-OX) 400 (241.3 Mg) MG tablet Take 1 tablet by mouth daily.  3  . meclizine (ANTIVERT) 25 MG tablet Take 1 tablet (25 mg total) by mouth 3 (three) times daily. 30 tablet 0  . metoprolol tartrate (LOPRESSOR) 25 MG tablet Take 1 tablet (25 mg total) by mouth 2 (two) times daily. 60 tablet 1  . nitroGLYCERIN (NITROSTAT) 0.4 MG SL tablet Place 1 tablet (0.4 mg total) under the tongue every 5 (five) minutes x 3 doses as needed for chest pain. 25 tablet 3  . ranitidine (ZANTAC) 300 MG tablet Take 300 mg by mouth at bedtime.    . simvastatin (ZOCOR) 40 MG tablet TAKE 1 TABLET BY MOUTH EVERYDAY AT BEDTIME 90 tablet 1  . spironolactone (ALDACTONE) 25 MG tablet Take 0.5 tablets (12.5 mg total) by mouth daily. 30 tablet 11   No current facility-administered medications for this visit.     REVIEW OF SYSTEMS:   [X] denotes positive finding, [ ] denotes negative finding Cardiac  Comments:  Chest pain or chest pressure:    Shortness of breath upon exertion: x   Short of breath when lying flat: x   Irregular heart rhythm: x       Vascular     Pain in calf, thigh, or hip brought on by ambulation: x   Pain in feet at night   that wakes you up from your sleep:     Blood clot in your veins:    Leg swelling:  x       Pulmonary    Oxygen at home:    Productive cough:     Wheezing:         Neurologic    Sudden weakness in arms or legs:     Sudden numbness in arms or legs:  x   Sudden onset of difficulty speaking or slurred speech:    Temporary loss of vision in one eye:     Problems with dizziness:  x       Gastrointestinal    Blood in stool:      Vomited blood:         Genitourinary    Burning when urinating:     Blood in urine:        Psychiatric    Major depression:         Hematologic    Bleeding problems:    Problems with blood clotting too easily:        Skin    Rashes or ulcers:        Constitutional    Fever or chills:     PHYSICAL EXAM:      Vitals:   02/26/18 0950  BP: 106/63  Pulse: 61  Resp: 16  Temp: (!) 97 F (36.1 C)  TempSrc: Oral  SpO2: 95%  Weight: 211 lb (95.7 kg)  Height: 5' 5.5" (1.664 m)    GENERAL: The patient is a well-nourished female, in no acute distress. The vital signs are documented above. CARDIAC: There is a regular rate and rhythm.  VASCULAR: Palpable left pedal pulse, nonpalpable right no carotid bruits PULMONARY: Nonlabored respirations ABDOMEN: Soft and non-tender with normal pitched bowel sounds.  MUSCULOSKELETAL: There are no major deformities or cyanosis. NEUROLOGIC: No focal weakness or paresthesias are detected. SKIN: There are no ulcers or rashes noted. PSYCHIATRIC: The patient has a normal affect.  STUDIES:   I have reviewed the following studies: Carotid duplex: No significant stenosis bilaterally ABIs: Right= 0.89 Left= 1.06  CT angiogram: 1. Fusiform aneurysmal dilatation of the infrarenal abdominal aorta with a maximal diameter of 3.6 cm (please see coronal  reformatted images secondary to vessel tortuosity). 2. 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 Incidentally, the focal stenosis is within a region of focal vessel tortuosity. 3. 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 4. Focal mild stenosis of the more superior right renal artery. 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 6. Additional ancillary findings as above.   ASSESSMENT and PLAN   AAA and right common iliac stenosis: I discussed with the patient that her aneurysm does not meet size criteria to repair, measuring 3.6 cm, however if we proceed with correcting her right common iliac stenosis without addressing her aneurysm, this could preclude her from being a endovascular candidate in the future.  Therefore I recommended proceeding with endovascular aneurysm repair specifically to address her right common iliac stenosis which is severely symptomatic.  I discussed the risks and benefits of the procedure including the risk of intestinal ischemia lower extremity distal embolization, cardiopulmonary complications and renal insufficiency.  All of her questions were answered.  I will schedule her for Wednesday, June 5.  She will need to be off of her Plavix prior to her procedure.   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  She will   need surveillance imaging of this in the future with plans for repair once she becomes greater than 5 cm.  Wells Mahealani Sulak, MD [facility] [place] Tel [phone] Pager [phone]   Patient has undergone successful embolization of intracranial aneurysm.  She is here today for repair of her aneurysm and claudication symptoms  Wells Telma Pyeatt 

## 2018-05-05 NOTE — Anesthesia Procedure Notes (Signed)
Procedure Name: Intubation Date/Time: 05/05/2018 8:49 AM Performed by: Lillia Abed, MD Pre-anesthesia Checklist: Patient identified, Emergency Drugs available, Suction available and Patient being monitored Patient Re-evaluated:Patient Re-evaluated prior to induction Oxygen Delivery Method: Circle System Utilized Preoxygenation: Pre-oxygenation with 100% oxygen Induction Type: IV induction Ventilation: Mask ventilation without difficulty Laryngoscope Size: Mac and 3 Grade View: Grade I Tube type: Oral Tube size: 7.0 mm Number of attempts: 1 Airway Equipment and Method: Stylet and Oral airway Placement Confirmation: ETT inserted through vocal cords under direct vision,  positive ETCO2 and breath sounds checked- equal and bilateral Secured at: 23 cm Tube secured with: Tape Dental Injury: Teeth and Oropharynx as per pre-operative assessment  Comments: Placed by Katharina Caper under supervision of MD and CRNA

## 2018-05-05 NOTE — Progress Notes (Signed)
  Day of Surgery Note    Subjective:  C/o back pain (says she lives with chronic back pain)   Vitals:   05/05/18 1200 05/05/18 1215  BP:    Pulse: 78 76  Resp: 13 12  Temp:  97.6 F (36.4 C)  SpO2: 96% 95%    Incisions:   Bilateral groins are soft without hematoma Extremities:  Easily palpable bilateral DP/PT pulses Cardiac:  regular Lungs:  Non labored Abdomen:  Soft, non tender to palpatioin.   Assessment/Plan:  This is a 67 y.o. female who is s/p  EVAR  -pt doing well in recovery.  She has palpable pedal pulses bilaterally.   -she does c/o back pain and tells me that she has chronic back pain and feels it is worse given she can't get up.  Her abdomen is soft and non tender to palpation.   -to 4 east  -if pt does well today, anticipate discharge home tomorrow.   Doreatha MassedSamantha Trinda Harlacher, PA-C 05/05/2018 12:18 PM [phone]

## 2018-05-05 NOTE — Op Note (Signed)
Patient name: Heather Livingston MRN: [id number] DOB: 03/12/1951 Sex: female  05/05/2018 Pre-operative Diagnosis: Right Iliac stenosis and AAA Post-operative diagnosis:  Same Surgeon:  Durene CalWells Brabham Assistants:  Clinton GallantEmma Collins Procedure:   #1: Endovascular repair of abdominal aortic aneurysm   #2: Ultrasound-guided bilateral common femoral artery access   #3: Catheter in aorta x2   #4: Abdominal aortogram Anesthesia: General Blood Loss: 100 cc Specimens: None  Findings: High-grade right common iliac artery stenosis, resolved after stenting  Devices used: Main body was primary right Gore 26 x 14 x 16.  Contralateral left was a Gore 16 x 9.5  Indications: The patient has been complaining of right hip buttock and leg claudication.  Her work-up revealed a high-grade proximal right common iliac artery stenosis and a 3.6 cm infrarenal abdominal aortic aneurysm as well as a 4 cm descending thoracic aortic aneurysm.  I discussed with her that isolated treatment of the right common iliac stenosis would make future stent grafting very challenging from a endovascular approach, and therefore we elected to simultaneously treat her small abdominal aortic aneurysm along with her right common iliac stenosis.  Procedure:  The patient was identified in the holding area [facility]  The patient was then placed supine on the table. general anesthesia was administered.  The patient was prepped and draped in the usual sterile fashion.  A time out was called and antibiotics were administered.  Ultrasound was used to evaluate bilateral common femoral arteries which were patent with minimal calcification.  A #11 blade was used to make a skin nick bilaterally.  Bilateral common femoral arteries were cannulated under ultrasound guidance with a micropuncture needle.  A 018 wire was advanced without resistance.  A 035 Bentson wire was then inserted.  I had to use a Berenstein 2 catheter to get the wire into the  aorta from the right side.  Subcutaneous tract was then dilated with an 8 [place]French dilator.  Probe glide devices were deployed at the 11:00 and 1 o'clock position for pre-closure and 8 French sheaths were placed bilaterally.  The patient was then fully heparinized.  Heparin levels were monitored with frequent ACT checks and repeat dosing.  Amplatz superstiff wires were placed on both sides and a 12 French sheath was placed on the left into the aorta, and a 16 French sheath was advanced into the aorta from the right.  The main body device was prepared on the back table.  This was a Gouru 26 x 14 x 16 device.  It was advanced through the sheath in the right groin and positioned at the level of L1.  A Omni Flush catheter was advanced up the left side and positioned at L1.  An abdominal aortogram was performed locating the renal arteries.  The main body device was then deployed down to the contralateral gate.  A repeat eval aortogram was performed confirming that the device was in good position.  I then obtained a second access into the 12 [place] sheath.  The contralateral gate was cannulated with a Berenstein 2 catheter and a Bentson wire.  The Berenstein catheter was removed and an Omni Flush catheter was inserted which was able to be freely rotated within the main body, confirming successful cannulation.  The image detector was then rotated to a right anterior oblique position and a retrograde injection was performed through the sheath locating the left hypogastric artery.  The ipsilateral extension was then prepared on the back table.  This was a Gore 16 x 9.5 device.  It was inserted through the sheath and then deployed landing at the level of the left hypogastric artery.  Next, the image detector was rotated to a left anterior oblique position and a retrograde injection was performed through the sheath locating the right hypogastric artery.  The ipsilateral limb was then deployed landing just proximal to the right  hypogastric artery.  I then used a MOB balloon to mold the proximal and distal attachment sites as well as the device overlap.  Extra angioplasty was performed within the right proximal common iliac artery which saw a resolution of the stenosis.  A completion arteriogram was then performed which showed preservation of patency of bilateral renal arteries as well as bilateral hypogastric arteries.  The aneurysm was successfully excluded.  There is no residual stenosis within the right common iliac artery.  Stiff wires were exchanged out for a Bentson wires.  The sheaths were removed and the arteriotomy sites were successfully closed by securing the previously placed pro-glide devices.  50 mg of protamine was given.  Doppler was used to confirm brisk signals in both feet.  Cautery was used for subcutaneous tissue and Dermabond was applied.  Patient was successfully extubated.  There were no immediate complications.  She was taken to recovery room in stable condition.   Disposition: To PACU stable   V. Durene Cal, M.D. [facility] [place] Office: [phone] Pager:  [phone]

## 2018-05-05 NOTE — Anesthesia Preprocedure Evaluation (Signed)
Anesthesia Evaluation  Patient identified by MRN, date of birth, ID band Patient awake    Reviewed: Allergy & Precautions, NPO status , Patient's Chart, lab work & pertinent test results  Airway Mallampati: I  TM Distance: >3 FB Neck ROM: Full    Dental   Pulmonary sleep apnea , former smoker,    Pulmonary exam normal        Cardiovascular hypertension, Pt. on medications + CAD, + Past MI, + Cardiac Stents and + CABG  Normal cardiovascular exam     Neuro/Psych CVA    GI/Hepatic GERD  Medicated and Controlled,  Endo/Other    Renal/GU      Musculoskeletal   Abdominal   Peds  Hematology   Anesthesia Other Findings   Reproductive/Obstetrics                             Anesthesia Physical Anesthesia Plan  ASA: III  Anesthesia Plan:    Post-op Pain Management:    Induction:   PONV Risk Score and Plan: 2 and Ondansetron and Treatment may vary due to age or medical condition  Airway Management Planned: Oral ETT  Additional Equipment: Arterial line  Intra-op Plan:   Post-operative Plan: Extubation in OR  Informed Consent: I have reviewed the patients History and Physical, chart, labs and discussed the procedure including the risks, benefits and alternatives for the proposed anesthesia with the patient or authorized representative who has indicated his/her understanding and acceptance.     Plan Discussed with: CRNA and Surgeon  Anesthesia Plan Comments:         Anesthesia Quick Evaluation

## 2018-05-05 NOTE — Transfer of Care (Signed)
Immediate Anesthesia Transfer of Care Note  Patient: Heather Livingston  Procedure(s) Performed: ABDOMINAL AORTIC ENDOVASCULAR STENT GRAFT (N/A )  Patient Location: PACU  Anesthesia Type:General  Level of Consciousness: drowsy  Airway & Oxygen Therapy: Patient Spontanous Breathing and Patient connected to nasal cannula oxygen  Post-op Assessment: Report given to RN, Post -op Vital signs reviewed and stable and Patient moving all extremities  Post vital signs: Reviewed and stable  Last Vitals:  Vitals Value Taken Time  BP    Temp    Pulse 80 05/05/2018 10:51 AM  Resp    SpO2 93 % 05/05/2018 10:51 AM  Vitals shown include unvalidated device data.  Last Pain:  Vitals:   05/05/18 0646  TempSrc:   PainSc: 0-No pain         Complications: No apparent anesthesia complications

## 2018-05-05 NOTE — Anesthesia Procedure Notes (Signed)
Arterial Line Insertion Start/End7/24/2019 7:30 AM, 05/05/2018 8:00 AM Performed by: Jodell Ciproato, Kyoko Elsea A, CRNA, CRNA  Patient location: Pre-op. Preanesthetic checklist: patient identified, IV checked, site marked, risks and benefits discussed, surgical consent, monitors and equipment checked, pre-op evaluation, timeout performed and anesthesia consent Lidocaine 1% used for infiltration Left, radial was placed Catheter size: 20 Fr Hand hygiene performed  and maximum sterile barriers used   Attempts: 1 Procedure performed without using ultrasound guided technique. Following insertion, dressing applied and Biopatch. Post procedure assessment: normal and unchanged  Patient tolerated the procedure well with no immediate complications. Additional procedure comments: Attempted in R radial x2 with pulsatile blood flow, easy wire advancement, but catheter would not advance. On L side, attempted x2 with pulsatile blood flow, easy wire advancement, but catheter would not advance on first attempt. On second attempt, catheter advanced with clockwise turning of catheter and clearly tortuous anatomy. Pt tolerated well..Marland Kitchen

## 2018-05-05 NOTE — Anesthesia Postprocedure Evaluation (Signed)
Anesthesia Post Note  Patient: Heather Livingston  Procedure(s) Performed: ABDOMINAL AORTIC ENDOVASCULAR STENT GRAFT (N/A )     Patient location during evaluation: PACU Anesthesia Type: General Level of consciousness: awake and alert Pain management: pain level controlled Vital Signs Assessment: post-procedure vital signs reviewed and stable Respiratory status: spontaneous breathing, nonlabored ventilation, respiratory function stable and patient connected to nasal cannula oxygen Cardiovascular status: blood pressure returned to baseline and stable Postop Assessment: no apparent nausea or vomiting Anesthetic complications: no    Last Vitals:  Vitals:   05/05/18 1500 05/05/18 1600  BP:    Pulse: 74   Resp: 12 15  Temp:    SpO2: 94%     Last Pain:  Vitals:   05/05/18 1600  TempSrc:   PainSc: 9                  Karess Harner DAVID

## 2018-05-05 NOTE — H&P (Signed)
[facility] [place]  Patient name: Heather Livingston    MRN: [id number]        DOB: January 04, 1951          Sex: female   REQUESTING PROVIDER:    Dr. Eldridge Dace   REASON FOR CONSULT:    AAA/ R CIA Stenosis  HISTORY OF PRESENT ILLNESS:   Heather Livingston is a 67 y.o. female, who is here today for further discussions regarding her right common iliac stenosis.  I saw the patient while she was in the hospital for heart catheterization.  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  The patient gets severe hip and leg pain with activity such as going up a flight of steps.  This is significantly restricting her quality of life.  She also has some symptoms that are not consistent with vascular disease such as pain in her right hip that will wake her up at night.  She does not have any open wounds.  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  She also has a 8 mm carotid pseudoaneurysm.  Her coronary disease is stable and is to be medically managed.  She does have a history of MI in the past.  She is on dual antiplatelet therapy.  She takes a statin for hypercholesterolemia.  She is medically managed for hypertension.  She is a former smoker.  PAST MEDICAL HISTORY        Past Medical History:  Diagnosis Date  . Bradycardia   . Cataracts, bilateral   . Coronary atherosclerosis of native coronary artery    DES circ 10/10, residual 100% occluded SVG to OM, patent LIMA to LAD and SVG to PDA grafts, LVEF 50%  . GERD (gastroesophageal reflux disease)   . Hyperlipemia   . Hypertension   . MI (myocardial infarction) (HCC)   . OSA (obstructive sleep apnea)    On CPAP  . Palpitations    Transient PAT/AF, CHADS2 score 1     FAMILY HISTORY        Family History  Problem Relation Age of Onset  . Heart attack Father   .  Hypertension Brother     SOCIAL HISTORY:   Social History        Socioeconomic History  . Marital status: Married    Spouse name: Not on file  . Number of children: Not on file  . Years of education: Not on file  . Highest education level: Not on file  Occupational History  . Occupation: Disabled  Social Needs  . Financial resource strain: Not on file  . Food insecurity:    Worry: Not on file    Inability: Not on file  . Transportation needs:    Medical: Not on file    Non-medical: Not on file  Tobacco Use  . Smoking status: Former Smoker    Packs/day: 0.75    Years: 43.00    Pack years: 32.25    Types: Cigarettes    Start date: 09/13/1965    Last attempt to quit: 10/14/2007    Years since quitting: 10.3  . Smokeless tobacco: Never Used  Substance and Sexual Activity  . Alcohol use: Yes    Alcohol/week: 0.0 oz    Comment: Occasional wine or beer  . Drug use: No  . Sexual activity: Not on file  Lifestyle  . Physical activity:  Days per week: Not on file    Minutes per session: Not on file  . Stress: Not on file  Relationships  . Social connections:    Talks on phone: Not on file    Gets together: Not on file    Attends religious service: Not on file    Active member of club or organization: Not on file    Attends meetings of clubs or organizations: Not on file    Relationship status: Not on file  . Intimate partner violence:    Fear of current or ex partner: Not on file    Emotionally abused: Not on file    Physically abused: Not on file    Forced sexual activity: Not on file  Other Topics Concern  . Not on file  Social History Narrative  . Not on file    ALLERGIES:    No Known Allergies  CURRENT MEDICATIONS:          Current Outpatient Medications  Medication Sig Dispense Refill  . albuterol (VENTOLIN HFA) 108 (90 Base) MCG/ACT inhaler Inhale 1-2 puffs into the lungs every 6 (six)  hours as needed for shortness of breath.     Marland Kitchen amLODipine (NORVASC) 10 MG tablet Take 1 tablet (10 mg total) by mouth daily.    Marland Kitchen aspirin EC 81 MG tablet Take 1 tablet (81 mg total) by mouth daily.    . clopidogrel (PLAVIX) 75 MG tablet Take 1 tablet (75 mg total) by mouth daily. 90 tablet 1  . hydrALAZINE (APRESOLINE) 100 MG tablet Take 1 tablet (100 mg total) by mouth 3 (three) times daily. 90 tablet 1  . hydrochlorothiazide (HYDRODIURIL) 25 MG tablet TAKE 1 TABLET BY MOUTH EVERY DAY 90 tablet 3  . HYDROcodone-acetaminophen (NORCO) 7.5-325 MG tablet Take 1 tablet by mouth every 6 (six) hours as needed for moderate pain.    . isosorbide mononitrate (IMDUR) 60 MG 24 hr tablet Take 1 tablet (60 mg total) by mouth daily. 90 tablet 1  . losartan (COZAAR) 100 MG tablet Take 1 tablet (100 mg total) by mouth daily. 90 tablet 1  . magnesium oxide (MAG-OX) 400 (241.3 Mg) MG tablet Take 1 tablet by mouth daily.  3  . meclizine (ANTIVERT) 25 MG tablet Take 1 tablet (25 mg total) by mouth 3 (three) times daily. 30 tablet 0  . metoprolol tartrate (LOPRESSOR) 25 MG tablet Take 1 tablet (25 mg total) by mouth 2 (two) times daily. 60 tablet 1  . nitroGLYCERIN (NITROSTAT) 0.4 MG SL tablet Place 1 tablet (0.4 mg total) under the tongue every 5 (five) minutes x 3 doses as needed for chest pain. 25 tablet 3  . ranitidine (ZANTAC) 300 MG tablet Take 300 mg by mouth at bedtime.    . simvastatin (ZOCOR) 40 MG tablet TAKE 1 TABLET BY MOUTH EVERYDAY AT BEDTIME 90 tablet 1  . spironolactone (ALDACTONE) 25 MG tablet Take 0.5 tablets (12.5 mg total) by mouth daily. 30 tablet 11   No current facility-administered medications for this visit.     REVIEW OF SYSTEMS:   [X]  denotes positive finding, [ ]  denotes negative finding Cardiac  Comments:  Chest pain or chest pressure:    Shortness of breath upon exertion: x   Short of breath when lying flat: x   Irregular heart rhythm: x       Vascular     Pain in calf, thigh, or hip brought on by ambulation: x   Pain in feet at night  that wakes you up from your sleep:     Blood clot in your veins:    Leg swelling:  x       Pulmonary    Oxygen at home:    Productive cough:     Wheezing:         Neurologic    Sudden weakness in arms or legs:     Sudden numbness in arms or legs:  x   Sudden onset of difficulty speaking or slurred speech:    Temporary loss of vision in one eye:     Problems with dizziness:  x       Gastrointestinal    Blood in stool:      Vomited blood:         Genitourinary    Burning when urinating:     Blood in urine:        Psychiatric    Major depression:         Hematologic    Bleeding problems:    Problems with blood clotting too easily:        Skin    Rashes or ulcers:        Constitutional    Fever or chills:     PHYSICAL EXAM:      Vitals:   02/26/18 0950  BP: 106/63  Pulse: 61  Resp: 16  Temp: (!) 97 F (36.1 C)  TempSrc: Oral  SpO2: 95%  Weight: 211 lb (95.7 kg)  Height: 5' 5.5" (1.664 m)    GENERAL: The patient is a well-nourished female, in no acute distress. The vital signs are documented above. CARDIAC: There is a regular rate and rhythm.  VASCULAR: Palpable left pedal pulse, nonpalpable right no carotid bruits PULMONARY: Nonlabored respirations ABDOMEN: Soft and non-tender with normal pitched bowel sounds.  MUSCULOSKELETAL: There are no major deformities or cyanosis. NEUROLOGIC: No focal weakness or paresthesias are detected. SKIN: There are no ulcers or rashes noted. PSYCHIATRIC: The patient has a normal affect.  STUDIES:   I have reviewed the following studies: Carotid duplex: No significant stenosis bilaterally ABIs: Right= 0.89 Left= 1.06  CT angiogram: 1. Fusiform aneurysmal dilatation of the infrarenal abdominal aorta with a maximal diameter of 3.6 cm (please see coronal  reformatted images secondary to vessel tortuosity). 2. 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 Incidentally, the focal stenosis is within a region of focal vessel tortuosity. 3. 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 4. Focal mild stenosis of the more superior right renal artery. 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 6. Additional ancillary findings as above.   ASSESSMENT and PLAN   AAA and right common iliac stenosis: I discussed with the patient that her aneurysm does not meet size criteria to repair, measuring 3.6 cm, however if we proceed with correcting her right common iliac stenosis without addressing her aneurysm, this could preclude her from being a endovascular candidate in the future.  Therefore I recommended proceeding with endovascular aneurysm repair specifically to address her right common iliac stenosis which is severely symptomatic.  I discussed the risks and benefits of the procedure including the risk of intestinal ischemia lower extremity distal embolization, cardiopulmonary complications and renal insufficiency.  All of her questions were answered.  I will schedule her for Wednesday, June 5.  She will need to be off of her Plavix prior to her procedure.   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  She will  need surveillance imaging of this in the future with plans for repair once she becomes greater than 5 cm.  Durene Cal, MD [facility] [phone] Pager [phone]   Patient has undergone successful embolization of intracranial aneurysm.  She is here today for repair of her aneurysm and claudication symptoms  Wells Brabham

## 2018-05-06 ENCOUNTER — Encounter (HOSPITAL_COMMUNITY): Payer: Self-pay | Admitting: Surgery

## 2018-05-06 LAB — BASIC METABOLIC PANEL
ANION GAP: 6 (ref 5–15)
BUN: 15 mg/dL (ref 8–23)
CALCIUM: 8.6 mg/dL — AB (ref 8.9–10.3)
CO2: 26 mmol/L (ref 22–32)
Chloride: 107 mmol/L (ref 98–111)
Creatinine, Ser: 0.74 mg/dL (ref 0.44–1.00)
Glucose, Bld: 154 mg/dL — ABNORMAL HIGH (ref 70–99)
Potassium: 4.3 mmol/L (ref 3.5–5.1)
SODIUM: 139 mmol/L (ref 135–145)

## 2018-05-06 LAB — CBC
HCT: 37 % (ref 36.0–46.0)
Hemoglobin: 12.2 g/dL (ref 12.0–15.0)
MCH: 32.1 pg (ref 26.0–34.0)
MCHC: 33 g/dL (ref 30.0–36.0)
MCV: 97.4 fL (ref 78.0–100.0)
PLATELETS: 167 10*3/uL (ref 150–400)
RBC: 3.8 MIL/uL — ABNORMAL LOW (ref 3.87–5.11)
RDW: 13 % (ref 11.5–15.5)
WBC: 9 10*3/uL (ref 4.0–10.5)

## 2018-05-06 MED ORDER — OXYCODONE-ACETAMINOPHEN 5-325 MG PO TABS: 1.0000 | ORAL_TABLET | Freq: Four times a day (QID) | ORAL | 0 refills | Status: AC | PRN

## 2018-05-06 NOTE — Discharge Instructions (Signed)
   [facility] [place]   Discharge Instructions  Endovascular Aortic Aneurysm Repair  Please refer to the following instructions for your post-procedure care. Your surgeon or Physician Assistant will discuss any changes with you.  Activity  You are encouraged to walk as much as you can. You can slowly return to normal activities but must avoid strenuous activity and heavy lifting until your doctor tells you it's OK. Avoid activities such as vacuuming or swinging a gold club. It is normal to feel tired for several weeks after your surgery. Do not drive until your doctor gives the OK and you are no longer taking prescription pain medications. It is also normal to have difficulty with sleep habits, eating, and bowel movements after surgery. These will go away with time.  Bathing/Showering  You may shower after you go home. If you have an incision, do not soak in a bathtub, hot tub, or swim until the incision heals completely.  Incision Care  Shower every day. Clean your incision with mild soap and water. Pat the area dry with a clean towel. You do not need a bandage unless otherwise instructed. Do not apply any ointments or creams to your incision. If you clothing is irritating, you may cover your incision with a dry gauze pad.  Diet  Resume your normal diet. There are no special food restrictions following this procedure. A low fat/low cholesterol diet is recommended for all patients with vascular disease. In order to heal from your surgery, it is CRITICAL to get adequate nutrition. Your body requires vitamins, minerals, and protein. Vegetables are the best source of vitamins and minerals. Vegetables also provide the perfect balance of protein. Processed food has little nutritional value, so try to avoid this.  Medications  Resume taking all of your medications unless your doctor or nurse practitioner tells you not to. If your incision is causing pain, you may take  over-the-counter pain relievers such as acetaminophen (Tylenol). If you were prescribed a stronger pain medication, please be aware these medications can cause nausea and constipation. Prevent nausea by taking the medication with a snack or meal. Avoid constipation by drinking plenty of fluids and eating foods with a high amount of fiber, such as fruits, vegetables, and grains. Do not take Tylenol if you are taking prescription pain medications.   Follow up  Our office will schedule a follow-up appointment with a C.T. scan 3-4 weeks after your surgery.  Please call us immediately for any of the following conditions  Severe or worsening pain in your legs or feet or in your abdomen back or chest. Increased pain, redness, drainage (pus) from your incision sit. Increased abdominal pain, bloating, nausea, vomiting or persistent diarrhea. Fever of 101 degrees or higher. Swelling in your leg (s),  Reduce your risk of vascular disease  Stop smoking. If you would like help call [facility] at 1-800-QUIT-NOW (1-800-784-8669) or [facility] at 336-586-4000. Manage your cholesterol Maintain a desired weight Control your diabetes Keep your blood pressure down  If you have questions, please call the office at [phone].   

## 2018-05-06 NOTE — Progress Notes (Addendum)
[facility] [place]  Subjective  - Doing well, ambulated, voided.  Little sore at stick sites and back pain from positioning.  She has Hx of lumbar arthritis.    Objective 137/63 (!) 54 97.9 F (36.6 C) (Axillary) 14 96%  Intake/Output Summary (Last 24 hours) at 05/06/2018 0713 Last data filed at 05/06/2018 0453 Gross per 24 hour  Intake 3322.38 ml  Output 2500 ml  Net 822.38 ml    Palpable DP 2+ B Groins soft NTTP without hematoma Heart RRR Lungs non labored breathing   Assessment/Planning: POD # 1 EVAR  She is tolerating PO's, ambulating independently and voided. She is stable for discharge home.  F/U in 4 weeks with CTA Abd/pelvis. Continue aspirin and plavix.  Mosetta Pigeonmma Maureen Collins 05/06/2018 7:13 AM --  Laboratory Lab Results: Recent Labs    05/05/18 1349 05/06/18 0423  WBC 5.2 9.0  HGB 14.3 12.2  HCT 42.3 37.0  PLT 199 167   BMET Recent Labs    05/05/18 1349 05/06/18 0423  NA  --  139  K  --  4.3  CL  --  107  CO2  --  26  GLUCOSE  --  154*  BUN  --  15  CREATININE 0.54 0.74  CALCIUM  --  8.6*    COAG Lab Results  Component Value Date   INR 0.92 04/27/2018   INR 0.91 03/18/2018   INR 0.99 03/10/2018   No results found for: PTT  I agree with the above.  I have seen and evaluated the patient.  She is stable for discharge.  She has palpable pedal pulses.  Durene CalWells Ludella Pranger

## 2018-05-06 NOTE — Plan of Care (Signed)
  Problem: Education: Goal: Knowledge of General Education information will improve Description Including pain rating scale, medication(s)/side effects and non-pharmacologic comfort measures Outcome: Progressing   Problem: Health Behavior/Discharge Planning: Goal: Ability to manage health-related needs will improve Outcome: Progressing   Problem: Clinical Measurements: Goal: Will remain free from infection Outcome: Progressing Goal: Respiratory complications will improve Outcome: Progressing Goal: Cardiovascular complication will be avoided Outcome: Progressing   Problem: Elimination: Goal: Will not experience complications related to bowel motility Outcome: Progressing

## 2018-05-06 NOTE — Progress Notes (Addendum)
1100 called and left message on home phone. Pt had two pair of glasses in over bed table.  Pt/family given discharge instructions, medication lists, follow up appointments, and when to call the doctor.  Pt/family verbalizes understanding. Pt given signs and symptoms of infection. Thomas HoffBurton, Kingston Guiles McClintock, RN

## 2018-05-10 ENCOUNTER — Emergency Department (HOSPITAL_COMMUNITY)
Admission: EM | Admit: 2018-05-10 | Discharge: 2018-05-10 | Disposition: A | Discharge status: Home / Self Care | Payer: Medicare Other | Attending: Emergency Medicine | Admitting: Emergency Medicine

## 2018-05-10 ENCOUNTER — Other Ambulatory Visit: Payer: Self-pay

## 2018-05-10 ENCOUNTER — Telehealth: Payer: Self-pay | Admitting: *Deleted

## 2018-05-10 ENCOUNTER — Encounter (HOSPITAL_COMMUNITY): Payer: Self-pay | Admitting: *Deleted

## 2018-05-10 DIAGNOSIS — Z48812 Encounter for surgical aftercare following surgery on the circulatory system: Secondary | ICD-10-CM

## 2018-05-10 DIAGNOSIS — Z79899 Other long term (current) drug therapy: Secondary | ICD-10-CM | POA: Insufficient documentation

## 2018-05-10 DIAGNOSIS — I714 Abdominal aortic aneurysm, without rupture, unspecified: Secondary | ICD-10-CM

## 2018-05-10 DIAGNOSIS — I2510 Atherosclerotic heart disease of native coronary artery without angina pectoris: Secondary | ICD-10-CM | POA: Insufficient documentation

## 2018-05-10 DIAGNOSIS — I10 Essential (primary) hypertension: Secondary | ICD-10-CM | POA: Diagnosis not present

## 2018-05-10 DIAGNOSIS — Z7902 Long term (current) use of antithrombotics/antiplatelets: Secondary | ICD-10-CM | POA: Insufficient documentation

## 2018-05-10 DIAGNOSIS — G629 Polyneuropathy, unspecified: Secondary | ICD-10-CM | POA: Diagnosis not present

## 2018-05-10 DIAGNOSIS — Z87891 Personal history of nicotine dependence: Secondary | ICD-10-CM | POA: Insufficient documentation

## 2018-05-10 DIAGNOSIS — Z7901 Long term (current) use of anticoagulants: Secondary | ICD-10-CM | POA: Insufficient documentation

## 2018-05-10 DIAGNOSIS — Z8679 Personal history of other diseases of the circulatory system: Secondary | ICD-10-CM | POA: Insufficient documentation

## 2018-05-10 DIAGNOSIS — R200 Anesthesia of skin: Secondary | ICD-10-CM | POA: Diagnosis present

## 2018-05-10 DIAGNOSIS — I252 Old myocardial infarction: Secondary | ICD-10-CM | POA: Insufficient documentation

## 2018-05-10 LAB — COMPREHENSIVE METABOLIC PANEL
ALBUMIN: 3.7 g/dL (ref 3.5–5.0)
ALT: 19 U/L (ref 0–44)
ANION GAP: 9 (ref 5–15)
AST: 19 U/L (ref 15–41)
Alkaline Phosphatase: 64 U/L (ref 38–126)
BUN: 17 mg/dL (ref 8–23)
CHLORIDE: 102 mmol/L (ref 98–111)
CO2: 25 mmol/L (ref 22–32)
Calcium: 9.3 mg/dL (ref 8.9–10.3)
Creatinine, Ser: 0.88 mg/dL (ref 0.44–1.00)
GFR calc Af Amer: >60 mL/min (ref >60)
GFR calc non Af Amer: >60 mL/min (ref >60)
GLUCOSE: 99 mg/dL (ref 70–99)
POTASSIUM: 3.7 mmol/L (ref 3.5–5.1)
Sodium: 136 mmol/L (ref 135–145)
Total Bilirubin: 0.8 mg/dL (ref 0.3–1.2)
Total Protein: 6.7 g/dL (ref 6.5–8.1)

## 2018-05-10 LAB — CBC WITH DIFFERENTIAL/PLATELET
Abs Immature Granulocytes: 0 10*3/uL (ref 0.0–0.1)
BASOS ABS: 0.1 10*3/uL (ref 0.0–0.1)
BASOS PCT: 1 %
EOS ABS: 0.3 10*3/uL (ref 0.0–0.7)
Eosinophils Relative: 3 %
HCT: 40.1 % (ref 36.0–46.0)
Hemoglobin: 13.1 g/dL (ref 12.0–15.0)
IMMATURE GRANULOCYTES: 0 %
Lymphocytes Relative: 25 %
Lymphs Abs: 2 10*3/uL (ref 0.7–4.0)
MCH: 31.9 pg (ref 26.0–34.0)
MCHC: 32.7 g/dL (ref 30.0–36.0)
MCV: 97.6 fL (ref 78.0–100.0)
Monocytes Absolute: 1 10*3/uL (ref 0.1–1.0)
Monocytes Relative: 13 %
NEUTROS PCT: 58 %
Neutro Abs: 4.5 10*3/uL (ref 1.7–7.7)
PLATELETS: 247 10*3/uL (ref 150–400)
RBC: 4.11 MIL/uL (ref 3.87–5.11)
RDW: 12.8 % (ref 11.5–15.5)
WBC: 7.8 10*3/uL (ref 4.0–10.5)

## 2018-05-10 MED ORDER — GABAPENTIN 100 MG PO CAPS: 100.0000 mg | ORAL_CAPSULE | Freq: Three times a day (TID) | ORAL | 0 refills | Status: DC

## 2018-05-10 NOTE — ED Provider Notes (Signed)
Patient placed in Quick Look pathway, seen and evaluated   Chief Complaint: Bilateral leg numbness and tingling.  HPI: 05/05/2018 patient had abdominal surgery for bilateral femoral artery blockage.  She reports since surgery she has had no relief in the tingling in symptoms she was having prior to her surgery.  She called her doctor's office today regarding the symptoms and was told to come to the ED immediately.  She is taking Plavix and aspirin, no other blood thinners.  She denies any chest pain, shortness of breath, abdominal pain, nausea vomiting or diarrhea.  ROS: + Leg paresthesias -chest pain, shortness of breath, abdominal pain  Physical Exam:   Gen: No distress  Neuro: Awake and Alert  Skin: Warm    Focused Exam: Bilateral lower extremities appear warm and well perfused, she has 2+ DP pulses and TP pulses bilaterally.  5/5 strength with dorsi and plantar flexion and sensation intact   Initiation of care has begun. The patient has been counseled on the process, plan, and necessity for staying for the completion/evaluation, and the remainder of the medical screening examination    Legrand RamsFord, Teigan Sahli N, PA-C 05/10/18 1602    Azalia Bilisampos, Kevin, MD 05/10/18 2340

## 2018-05-10 NOTE — Telephone Encounter (Signed)
Patient called c/o severe cramping pain RLE with no relief with pain medication 10/10. RLE  Numbness and toes are cold." Bottom of right foot feels like leather"S/P EVAR 05/05/18. Claims cramping pain in LLE also. [facility].

## 2018-05-10 NOTE — Discharge Summary (Signed)
[facility] Discharge Summary   Patient ID:  Heather Livingston MRN: [id number] DOB/AGE: 68/11/1950 68 y.o.  Admit date: 05/05/2018 Discharge date: 7/25/2019Date of Surgery: 05/05/2018 Surgeon: Surgeon(s): Nada LibmanBrabham, Vance W, MD  Admission Diagnosis: ABDOMINAL AORTIC ANEURYSM  Discharge Diagnoses:  ABDOMINAL AORTIC ANEURYSM  Secondary Diagnoses: Past Medical History:  Diagnosis Date  . Abdominal aortic aneurysm (AAA) (HCC)   . Arthritis    back  . Bradycardia   . Brain aneurysm   . Cataracts, bilateral   . Coronary atherosclerosis of native coronary artery    DES circ 10/10, residual 100% occluded SVG to OM, patent LIMA to LAD and SVG to PDA grafts, LVEF 50%  . GERD (gastroesophageal reflux disease)   . History of hiatal hernia   . Hyperlipemia   . Hypertension   . Low iron   . MI (myocardial infarction) (HCC)   . Nonruptured cerebral aneurysm   . OSA (obstructive sleep apnea)    On CPAP  . Palpitations    Transient PAT/AF, CHADS2 score 1  . Pneumonia   . Restless legs   . Stroke [facility](HCC)    found on CT scan, pt not aware she had had them    Procedure(s): ABDOMINAL AORTIC ENDOVASCULAR STENT GRAFT  Discharged Condition: good  HPI: 67 y/o female with known AAA with a maximum diameter is 4 cm with CIA stenosis.  The patient has severe hip and leg pain with activity such as going up a flight of steps. This is significantly restricting her quality of life.   After review and discussion with Dr. Myra GianottiBrabham she was scheduled for EVAR repair specifically to address her right common iliac stenosis which is severely symptomatic.           Hospital Course:  Heather Livingston is a 67 y.o. female is S/P  Procedure(s): ABDOMINAL AORTIC ENDOVASCULAR STENT GRAFT Post operative course   She is tolerating PO's, ambulating independently and voided. She is stable for discharge home.  F/U in 4 weeks with CTA Abd/pelvis. Continue aspirin and plavix. Disposition  stable      Significant Diagnostic Studies: CBC Lab Results  Component Value Date   WBC 9.0 05/06/2018   HGB 12.2 05/06/2018   HCT 37.0 05/06/2018   MCV 97.4 05/06/2018   PLT 167 05/06/2018    BMET    Component Value Date/Time   NA 139 05/06/2018 0423   K 4.3 05/06/2018 0423   CL 107 05/06/2018 0423   CO2 26 05/06/2018 0423   GLUCOSE 154 (H) 05/06/2018 0423   BUN 15 05/06/2018 0423   CREATININE 0.74 05/06/2018 0423   CALCIUM 8.6 (L) 05/06/2018 0423   GFRNONAA >60 05/06/2018 0423   GFRAA >60 05/06/2018 0423   COAG Lab Results  Component Value Date   INR 0.92 04/27/2018   INR 0.91 03/18/2018   INR 0.99 03/10/2018     Disposition:  Discharge to :Home Discharge Instructions    Call MD for:  redness, tenderness, or signs of infection (pain, swelling, bleeding, redness, odor or green/yellow discharge around incision site)   Complete by:  As directed    Call MD for:  severe or increased pain, loss or decreased feeling  in affected limb(s)   Complete by:  As directed    Call MD for:  temperature >100.5   Complete by:  As directed    Resume previous diet   Complete by:  As directed      Allergies as of 05/06/2018  No Known Allergies     Medication List    TAKE these medications   amLODipine 5 MG tablet Commonly known as:  NORVASC Take 5 mg by mouth daily.   aspirin EC 81 MG tablet Take 1 tablet (81 mg total) by mouth daily.   clopidogrel 75 MG tablet Commonly known as:  PLAVIX Take 1 tablet (75 mg total) by mouth daily.   fluticasone 50 MCG/ACT nasal spray Commonly known as:  FLONASE Place 2 sprays into both nostrils at bedtime.   hydrALAZINE 100 MG tablet Commonly known as:  APRESOLINE TAKE 1 TABLET BY MOUTH 3 TIMES DAILY.   hydrochlorothiazide 25 MG tablet Commonly known as:  HYDRODIURIL TAKE 1 TABLET BY MOUTH EVERY DAY   HYDROcodone-acetaminophen 7.5-325 MG tablet Commonly known as:  NORCO Take 0.5-1 tablets by mouth every 12 (twelve)  hours as needed for moderate pain.   isosorbide mononitrate 60 MG 24 hr tablet Commonly known as:  IMDUR Take 1 tablet (60 mg total) by mouth daily.   losartan 100 MG tablet Commonly known as:  COZAAR Take 1 tablet (100 mg total) by mouth daily.   magnesium oxide 400 (241.3 Mg) MG tablet Commonly known as:  MAG-OX Take 400 mg by mouth daily.   metoprolol tartrate 25 MG tablet Commonly known as:  LOPRESSOR TAKE 1 TABLET BY MOUTH TWICE A DAY   nitroGLYCERIN 0.4 MG SL tablet Commonly known as:  NITROSTAT Place 1 tablet (0.4 mg total) under the tongue every 5 (five) minutes x 3 doses as needed for chest pain.   oxyCODONE-acetaminophen 5-325 MG tablet Commonly known as:  PERCOCET Take 1 tablet by mouth every 6 (six) hours as needed for severe pain. What changed:  when to take this   ranitidine 300 MG tablet Commonly known as:  ZANTAC Take 300 mg by mouth at bedtime.   simvastatin 40 MG tablet Commonly known as:  ZOCOR TAKE 1 TABLET BY MOUTH EVERYDAY AT BEDTIME   spironolactone 25 MG tablet Commonly known as:  ALDACTONE Take 0.5 tablets (12.5 mg total) by mouth daily.   VENTOLIN HFA 108 (90 Base) MCG/ACT inhaler Generic drug:  albuterol Inhale 1-2 puffs into the lungs every 6 (six) hours as needed for shortness of breath.      Verbal and written Discharge instructions given to the patient. Wound care per Discharge AVS Follow-up Information    Nada Libman, MD Follow up in 4 week(s).   Specialties:  Vascular Surgery, Cardiology Why:  office will call Contact information: [address] [place] [address] [phone]           Signed: Mosetta Pigeon 05/10/2018, 9:41 AM - For VQI Registry use --- Instructions: Press F2 to tab through selections.  Delete question if not applicable.   Post-op:  Time to Extubation: [x ] In OR, [ ]  < 12 hrs, [ ]  12-24 hrs, [ ]  >=24 hrs Vasopressors Req. Post-op: No MI: [x ] No, [ ]  Troponin only, [ ]  EKG or  Clinical New Arrhythmia: No CHF: No ICU Stay: 0 days Transfusion: No  If yes, 0 units given  Complications: Resp failure: [x ] none, [ ]  Pneumonia, [ ]  Ventilator Chg in renal function: [ ]  none, [ ]  Inc. Cr > 0.5, [ ]  Temp. Dialysis, [ ]  Permanent dialysis Leg ischemia: [ ]  No, [ ]  Yes, no Surgery needed, [ ]  Yes, Surgery needed, [ ]  Amputation Bowel ischemia: [ ]  No, [ ]  Medical Rx, [ ]  Surgical Rx  Wound complication: [ ]  No, [ ]  Superficial separation/infection, [ ]  Return to OR Return to OR: No  Return to OR for bleeding: No Stroke: [x ] None, [ ]  Minor, [ ]  Major  Discharge medications: Statin use:  Yes ASA use:  Yes Plavix use:  No  for medical reason   Beta blocker use:  Yes

## 2018-05-10 NOTE — ED Notes (Signed)
Discharged by Marijean NiemannJaime charge nurse

## 2018-05-10 NOTE — ED Provider Notes (Signed)
[facility] EMERGENCY DEPARTMENT Provider Note   CSN: [id number] Arrival date & time: 05/10/18  1507     History   Chief Complaint Chief Complaint  Patient presents with  . Post-op Problem    HPI Heather Livingston is a 68 y.o. female.  HPI   66yF with pain and numbness in b/l LE. She is s/p endovascular repair of AAA on 7/24. She reports numbness, cold sensation, tingling, burning, etc. She has symptoms on both legs but noticeably worse on R. She says she had these symptoms prior to her surgery and they have persisted after. She denies significant change but she was concerned because she expected them to improve.   Past Medical History:  Diagnosis Date  . Abdominal aortic aneurysm (AAA) (HCC)   . Arthritis    back  . Bradycardia   . Brain aneurysm   . Cataracts, bilateral   . Coronary atherosclerosis of native coronary artery    DES circ 10/10, residual 100% occluded SVG to OM, patent LIMA to LAD and SVG to PDA grafts, LVEF 50%  . GERD (gastroesophageal reflux disease)   . History of hiatal hernia   . Hyperlipemia   . Hypertension   . Low iron   . MI (myocardial infarction) (HCC)   . Nonruptured cerebral aneurysm   . OSA (obstructive sleep apnea)    On CPAP  . Palpitations    Transient PAT/AF, CHADS2 score 1  . Pneumonia   . Restless legs   . Stroke [facility])    found on CT scan, pt not aware she had had them    Patient Active Problem List   Diagnosis Date Noted  . AAA (abdominal aortic aneurysm) (HCC) 05/05/2018  . Cerebral aneurysm, nonruptured 03/18/2018  . Chest pain 01/23/2018  . GERD (gastroesophageal reflux disease) 01/23/2018  . OSA (obstructive sleep apnea) 01/23/2018  . Aneurysm of infrarenal abdominal aorta (HCC) 01/21/2018  . 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 Unstable angina (HCC) 01/19/2018  . Essential hypertension, benign 10/29/2010  . CORONARY ATHEROSCLEROSIS NATIVE CORONARY ARTERY 10/29/2010  . SUPRAVENTRICULAR  TACHYCARDIA 09/13/2010  . Hyperlipemia 08/27/2010  . 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 Palpitations 08/27/2010    Past Surgical History:  Procedure Laterality Date  . ABDOMINAL AORTIC ENDOVASCULAR STENT GRAFT N/A 05/05/2018   Procedure: ABDOMINAL AORTIC ENDOVASCULAR STENT GRAFT;  Surgeon: Nada Libman, MD;  Location: [facility] OR;  Service: Vascular;  Laterality: N/A;  . ABDOMINAL AORTOGRAM N/A 01/19/2018   Procedure: ABDOMINAL AORTOGRAM;  Surgeon: Corky Crafts, MD;  Location: [facility] INVASIVE CV LAB;  Service: Cardiovascular;  Laterality: N/A;  . ABDOMINAL HYSTERECTOMY    . CARPAL TUNNEL RELEASE    . CORONARY ARTERY BYPASS GRAFT  2009   [facility], LIMA to LAD, SVG to OM, SVG to PDA  . ENDOVASCULAR REPAIR/STENT GRAFT  05/05/2018  . IR ANGIO INTRA EXTRACRAN SEL INTERNAL CAROTID BILAT MOD SED  02/23/2018  . IR ANGIO VERTEBRAL SEL VERTEBRAL BILAT MOD SED  02/23/2018  . IR ANGIO VERTEBRAL SEL VERTEBRAL UNI L MOD SED  03/18/2018  . IR ANGIOGRAM FOLLOW UP STUDY  03/18/2018  . IR ANGIOGRAM FOLLOW UP STUDY  03/18/2018  . IR ANGIOGRAM FOLLOW UP STUDY  03/18/2018  . IR ANGIOGRAM FOLLOW UP STUDY  03/18/2018  . IR TRANSCATH/EMBOLIZ  03/18/2018  . LEFT HEART CATH AND CORS/GRAFTS ANGIOGRAPHY N/A 01/19/2018   Procedure: LEFT HEART CATH AND CORS/GRAFTS ANGIOGRAPHY;  Surgeon: Corky Crafts, MD;  Location: [facility] INVASIVE CV  LAB;  Service: Cardiovascular;  Laterality: N/A;  . LEFT HEART CATHETERIZATION WITH CORONARY/GRAFT ANGIOGRAM N/A 12/27/2012   Procedure: LEFT HEART CATHETERIZATION WITH Isabel CapriceORONARY/GRAFT ANGIOGRAM;  Surgeon: Herby Abrahamhomas D Stuckey, MD;  [facility];  Service: Cardiovascular;  Laterality: N/A;  . RADIOLOGY WITH ANESTHESIA N/A 03/18/2018   Procedure: coil embolization of basilar aneurysm;  Surgeon: Lisbeth RenshawNundkumar, Neelesh, MD;  [facility];  Service: Radiology;  Laterality: N/A;     OB History   None      Home Medications    Prior to Admission medications   Medication Sig Start Date End Date  Taking? Authorizing Provider  albuterol (VENTOLIN HFA) 108 (90 Base) MCG/ACT inhaler Inhale 1-2 puffs into the lungs every 6 (six) hours as needed for shortness of breath.  12/19/16   [provider]  amLODipine (NORVASC) 5 MG tablet Take 5 mg by mouth daily. 01/31/18   [provider]  aspirin EC 81 MG tablet Take 1 tablet (81 mg total) by mouth daily. 01/14/13   Serpe, Clide DeutscherEugene C, PA-C  clopidogrel (PLAVIX) 75 MG tablet Take 1 tablet (75 mg total) by mouth daily. 11/23/17   Jonelle SidleMcDowell, Samuel G, MD  fluticasone (FLONASE) 50 MCG/ACT nasal spray Place 2 sprays into both nostrils at bedtime.    [provider]  hydrALAZINE (APRESOLINE) 100 MG tablet TAKE 1 TABLET BY MOUTH 3 TIMES DAILY. 04/14/18   Jonelle SidleMcDowell, Samuel G, MD  hydrochlorothiazide (HYDRODIURIL) 25 MG tablet TAKE 1 TABLET BY MOUTH EVERY DAY 04/02/17   Jonelle SidleMcDowell, Samuel G, MD  HYDROcodone-acetaminophen (NORCO) 7.5-325 MG tablet Take 0.5-1 tablets by mouth every 12 (twelve) hours as needed for moderate pain.    [provider]  isosorbide mononitrate (IMDUR) 60 MG 24 hr tablet Take 1 tablet (60 mg total) by mouth daily. 11/23/17   Jonelle SidleMcDowell, Samuel G, MD  losartan (COZAAR) 100 MG tablet Take 1 tablet (100 mg total) by mouth daily. 11/23/17   Jonelle SidleMcDowell, Samuel G, MD  magnesium oxide (MAG-OX) 400 (241.3 Mg) MG tablet Take 400 mg by mouth daily.  06/03/16   [provider]  metoprolol tartrate (LOPRESSOR) 25 MG tablet TAKE 1 TABLET BY MOUTH TWICE A DAY 04/05/18   Jonelle SidleMcDowell, Samuel G, MD  nitroGLYCERIN (NITROSTAT) 0.4 MG SL tablet Place 1 tablet (0.4 mg total) under the tongue every 5 (five) minutes x 3 doses as needed for chest pain. 02/05/16   Jonelle SidleMcDowell, Samuel G, MD  oxyCODONE-acetaminophen (PERCOCET) 5-325 MG tablet Take 1 tablet by mouth every 6 (six) hours as needed for severe pain. 05/06/18 05/06/19  Lars Mageollins, Emma M, PA-C  ranitidine (ZANTAC) 300 MG tablet Take 300 mg by mouth at bedtime.    [provider]    simvastatin (ZOCOR) 40 MG tablet TAKE 1 TABLET BY MOUTH EVERYDAY AT BEDTIME 11/23/17   Jonelle SidleMcDowell, Samuel G, MD  spironolactone (ALDACTONE) 25 MG tablet Take 0.5 tablets (12.5 mg total) by mouth daily. 01/23/18 01/23/19  Azalee CourseMeng, Hao, PA    Family History Family History  Problem Relation Age of Onset  . Hypertension Mother   . Heart attack Father   . Hypertension Brother     Social History Social History   Tobacco Use  . Smoking status: Former Smoker    Packs/day: 0.75    Years: 43.00    Pack years: 32.25    Types: Cigarettes    Start date: 09/13/1965    Last attempt to quit: 10/14/2007    Years since quitting: 10.5  . Smokeless tobacco: Never Used  Substance Use Topics  . Alcohol use: Yes    Alcohol/week: 0.0 oz    Comment: Occasional wine or beer  . Drug use: No     Allergies   Patient has no known allergies.   Review of Systems Review of Systems  All systems reviewed and negative, other than as noted in HPI.  Physical Exam Updated Vital Signs BP 139/77 (BP Location: Right Arm)   Pulse 74   Temp 98.2 F (36.8 C) (Oral)   Resp 18   Ht 5' 5.5" (1.664 m)   Wt 96.6 kg (213 lb)   SpO2 100%   BMI 34.91 kg/m   Physical Exam  Constitutional: She appears well-developed and well-nourished. No distress.  HENT:  Head: Normocephalic and atraumatic.  Eyes: Conjunctivae are normal. Right eye exhibits no discharge. Left eye exhibits no discharge.  Neck: Neck supple.  Cardiovascular: Normal rate, regular rhythm and normal heart sounds. Exam reveals no gallop and no friction rub.  No murmur heard. Subacute appearing ecchymosis to b/l groin. Wounds intact with tissue adhesive still in place. Palpable femoral pulses. Easily palpable and symmetric DP and PT pulses. Able to actively range bl hips/knees/ankles w/o obvious functional limitation.   Pulmonary/Chest: Effort normal and breath sounds normal. No respiratory distress.  Abdominal: Soft. She exhibits no distension. There is  no tenderness.  Musculoskeletal: She exhibits no edema or tenderness.  Neurological: She is alert.  Skin: Skin is warm and dry.  Psychiatric: She has a normal mood and affect. Her behavior is normal. Thought content normal.  Nursing note and vitals reviewed.    ED Treatments / Results  Labs (all labs ordered are listed, but only abnormal results are displayed) Labs Reviewed  CBC WITH DIFFERENTIAL/PLATELET  COMPREHENSIVE METABOLIC PANEL    EKG None  Radiology No results found.  Procedures Procedures (including critical care time)  Medications Ordered in ED Medications - No data to display   Initial Impression / Assessment and Plan / ED Course  I have reviewed the triage vital signs and the nursing notes.  Pertinent labs & imaging results that were available during my care of the patient were reviewed by me and considered in my medical decision making (see chart for details).     66yF with numerous complaints. Telephone encounter from earlier today lists concerning symptoms (10/10 pain, toes cold, etc). What she describes to me sounds more like typical symptoms of neuropathy ("pins and needles, feels asleep, itches, burns").   Her symptoms are not from acute ischemia. She has easily palpable pulses in both feet. Neuro exam is nonfocal. She tells me she has actually had these symptoms prior to and still after her surgery. She was expecting them to improve though and became concerned since they have not. Will try her on gabapentin. Return precautions discussed. FU with vascular surgery as recommended otherwise.     Final Clinical Impressions(s) / ED Diagnoses   Final diagnoses:  Neuropathy    ED Discharge Orders    None       Raeford Razor, MD 05/14/18 [phone]

## 2018-05-10 NOTE — ED Triage Notes (Signed)
Pt in c/o bil leg  Numbness onset since having procedure completed on 05/05/18 for abd aneurysm and bil leg artery blockage per pt, pt reports calling the office today to tell the surgeon about her symptoms and was told to come here, pt denies CP, SOB, N/V/D, A&O x4, pt takes Plavix and ASA

## 2018-05-10 NOTE — ED Notes (Signed)
Patient verbalizes understanding of discharge instructions. Opportunity for questioning and answers were provided. Armband removed by staff, pt discharged from ED.  

## 2018-05-11 ENCOUNTER — Telehealth: Payer: Self-pay | Admitting: Surgery

## 2018-05-11 NOTE — Telephone Encounter (Signed)
sch appt spk to pt CTA 05-21-18 1230pm  05/24/18 215pm p/o MD

## 2018-05-21 ENCOUNTER — Ambulatory Visit
Admission: RE | Admit: 2018-05-21 | Discharge: 2018-05-21 | Disposition: A | Discharge status: Home / Self Care | Payer: Medicare Other | Source: Ambulatory Visit | Attending: Surgery | Admitting: Surgery

## 2018-05-21 DIAGNOSIS — Z48812 Encounter for surgical aftercare following surgery on the circulatory system: Secondary | ICD-10-CM

## 2018-05-21 DIAGNOSIS — I714 Abdominal aortic aneurysm, without rupture, unspecified: Secondary | ICD-10-CM

## 2018-05-21 MED ORDER — IOPAMIDOL (ISOVUE-370) INJECTION 76%
75.0000 mL | Freq: Once | INTRAVENOUS | Status: AC | PRN
  Administered 2018-05-21: 75 mL via INTRAVENOUS

## 2018-05-24 ENCOUNTER — Ambulatory Visit (INDEPENDENT_AMBULATORY_CARE_PROVIDER_SITE_OTHER): Payer: Medicare Other | Admitting: Surgery

## 2018-05-24 ENCOUNTER — Encounter: Payer: Self-pay | Admitting: Surgery

## 2018-05-24 ENCOUNTER — Other Ambulatory Visit: Payer: Self-pay

## 2018-05-24 VITALS — BP 128/62 | HR 80 | Temp 97.1°F | Resp 16 | Ht 65.5 in | Wt 214.0 lb

## 2018-05-24 DIAGNOSIS — I714 Abdominal aortic aneurysm, without rupture, unspecified: Secondary | ICD-10-CM

## 2018-05-24 MED ORDER — CIPROFLOXACIN HCL 500 MG PO TABS: 500.0000 mg | ORAL_TABLET | Freq: Two times a day (BID) | ORAL | 0 refills | Status: DC

## 2018-05-24 NOTE — Progress Notes (Signed)
Patient name: Heather Livingston MRN: [id number] DOB: 10/10/1951 Sex: female  REASON FOR VISIT:     post op  HISTORY OF PRESENT ILLNESS:   Heather Livingston is a 67 y.o. female who returns today, status post endovascular aneurysm repair performed on 05/05/2018.  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  I felt that if I were to treat her iliac stenosis percutaneously that I would also need to entertain treating the aneurysm so as not to preclude endovascular options in the future.  She tolerated procedure well.  She did go to the emergency room after her operation because of some numbness in her foot however this was not attributed to her aneurysm repair.  CURRENT MEDICATIONS:    Current Outpatient Medications  Medication Sig Dispense Refill  . albuterol (VENTOLIN HFA) 108 (90 Base) MCG/ACT inhaler Inhale 1-2 puffs into the lungs every 6 (six) hours as needed for shortness of breath.     Marland Kitchen. amLODipine (NORVASC) 5 MG tablet Take 5 mg by mouth daily.  1  . aspirin EC 81 MG tablet Take 1 tablet (81 mg total) by mouth daily.    . clopidogrel (PLAVIX) 75 MG tablet Take 1 tablet (75 mg total) by mouth daily. 90 tablet 1  . fluticasone (FLONASE) 50 MCG/ACT nasal spray Place 2 sprays into both nostrils at bedtime.    . gabapentin (NEURONTIN) 100 MG capsule Take 1 capsule (100 mg total) by mouth 3 (three) times daily. 90 capsule 0  . hydrALAZINE (APRESOLINE) 100 MG tablet TAKE 1 TABLET BY MOUTH 3 TIMES DAILY. 90 tablet 2  . hydrochlorothiazide (HYDRODIURIL) 25 MG tablet TAKE 1 TABLET BY MOUTH EVERY DAY 90 tablet 3  . HYDROcodone-acetaminophen (NORCO) 7.5-325 MG tablet Take 0.5-1 tablets by mouth every 12 (twelve) hours as needed for moderate pain.    . isosorbide mononitrate (IMDUR) 60 MG 24 hr tablet Take 1 tablet (60 mg total) by mouth daily. 90 tablet 1  . losartan (COZAAR) 100 MG tablet Take 1 tablet (100 mg total) by mouth  daily. 90 tablet 1  . magnesium oxide (MAG-OX) 400 (241.3 Mg) MG tablet Take 400 mg by mouth daily.   3  . metoprolol tartrate (LOPRESSOR) 25 MG tablet TAKE 1 TABLET BY MOUTH TWICE A DAY 60 tablet 6  . nitroGLYCERIN (NITROSTAT) 0.4 MG SL tablet Place 1 tablet (0.4 mg total) under the tongue every 5 (five) minutes x 3 doses as needed for chest pain. 25 tablet 3  . ranitidine (ZANTAC) 300 MG tablet TAKE 1 TABLET BY MOUTH EVERY DAY AT NIGHT    . simvastatin (ZOCOR) 40 MG tablet TAKE 1 TABLET BY MOUTH EVERYDAY AT BEDTIME 90 tablet 1  . spironolactone (ALDACTONE) 25 MG tablet Take 0.5 tablets (12.5 mg total) by mouth daily. 30 tablet 11  . oxyCODONE-acetaminophen (PERCOCET) 5-325 MG tablet Take 1 tablet by mouth every 6 (six) hours as needed for severe pain. (Patient not taking: Reported on 05/24/2018) 6 tablet 0   No current facility-administered medications for this visit.     REVIEW OF SYSTEMS:   [X]  denotes positive finding, [ ]  denotes negative finding Cardiac  Comments:  Chest pain or chest pressure:    Shortness of breath upon exertion:    Short of breath when lying flat:    Irregular heart rhythm:    Constitutional    Fever or chills:      PHYSICAL EXAM:   Vitals:   05/24/18  1401  BP: 128/62  Pulse: 80  Resp: 16  Temp: (!) 97.1 F (36.2 C)  TempSrc: Oral  SpO2: 95%  Weight: 214 lb (97.1 kg)  Height: 5' 5.5" (1.664 m)    GENERAL: The patient is a well-nourished female, in no acute distress. The vital signs are documented above. CARDIOVASCULAR: There is a regular rate and rhythm. PULMONARY: Non-labored respirations There was a small area of fat necrosis in the right groin incision which I removed.  She has briskly palpable pedal pulses.  STUDIES:    Stable aneurysm sac size post EVAR with maximal sac dimensions of 3.5 x 3.7 cm. There is evidence of a focal type 2 endoleak within the posterior aspect of the aneurysm sac with inflow and outflow via L3 and L4 lumbar  arteries. The endograft is normally patent and well positioned with no evidence of type 1 endoleak. Right common iliac artery stenosis has been successfully treated with widely patent endograft limb present.  NON-VASCULAR  1. Evidence of elevated right heart pressures with reflux of contrast into the intrahepatic IVC and hepatic veins. 2. Stable appearance of hyperplastic left adrenal gland.    MEDICAL ISSUES:   S/p EVAR, doing very well.  CT scan shows widely patent stent.  She will follow-up in 6 months with ultrasound.  We discussed that if she has persistent neuropathic issues in her right leg that spine surgery consultation could be obtained  Durene CalWells Brabham, MD [facility] Tel [phone] Pager [phone]

## 2018-06-25 ENCOUNTER — Other Ambulatory Visit: Payer: Self-pay | Admitting: *Deleted

## 2018-06-25 ENCOUNTER — Other Ambulatory Visit: Payer: Self-pay | Admitting: Cardiology

## 2018-06-25 MED ORDER — HYDROCHLOROTHIAZIDE 25 MG PO TABS: 25.0000 mg | ORAL_TABLET | Freq: Every day | ORAL | 3 refills | Status: DC

## 2018-07-25 ENCOUNTER — Other Ambulatory Visit: Payer: Self-pay | Admitting: Cardiology

## 2018-08-11 NOTE — Progress Notes (Signed)
Cardiology Office Note  Date: 08/12/2018   ID: Heather, Livingston 25-Dec-1950, MRN [id number]  PCP: Theodoro Kos, MD  Primary Cardiologist: Nona Dell, MD   Chief Complaint  Patient presents with  . Coronary Artery Disease    History of Present Illness: Heather Livingston is a 67 y.o. female last seen in June.  She is here for a routine visit.  From a cardiac perspective she reports no angina symptoms nitroglycerin use, reports chronic shortness of breath that is likely multifactorial.  She has not been exercising or walking regularly.  Her weight is also up.  She continues to follow with Dr. Myra Gianotti now status post endovascular abdominal aortic aneurysm and right common iliac stenosis repair in July of this year.  Cardiac catheterization in April of this year revealed patent bypass grafts with the exception of an occluded SVG to OM but only 50% mid circumflex stenosis.  Medical therapy is being pursued.  Current cardiac regimen includes aspirin, Plavix, Norvasc, hydralazine, HCTZ, Imdur, Cozaar, Lopressor, Aldactone, Zocor, and as needed nitroglycerin.  I reviewed her interval lab work in July as outlined below.  Past Medical History:  Diagnosis Date  . Abdominal aortic aneurysm (AAA) (HCC)   . Arthritis    back  . Bradycardia   . Brain aneurysm   . Cataracts, bilateral   . Coronary atherosclerosis of native coronary artery    DES circ 10/10, residual 100% occluded SVG to OM, patent LIMA to LAD and SVG to PDA grafts, LVEF 50%  . GERD (gastroesophageal reflux disease)   . History of hiatal hernia   . Hyperlipemia   . Hypertension   . Low iron   . MI (myocardial infarction) (HCC)   . Nonruptured cerebral aneurysm   . OSA (obstructive sleep apnea)    On CPAP  . Palpitations    Transient PAT/AF, CHADS2 score 1  . Pneumonia   . Restless legs   . Stroke [facility])    found on CT scan, pt not aware she had had them    Past Surgical History:  Procedure Laterality Date    . ABDOMINAL AORTIC ENDOVASCULAR STENT GRAFT N/A 05/05/2018   Procedure: ABDOMINAL AORTIC ENDOVASCULAR STENT GRAFT;  Surgeon: Nada Libman, MD;  Location: [facility] OR;  Service: Vascular;  Laterality: N/A;  . ABDOMINAL AORTOGRAM N/A 01/19/2018   Procedure: ABDOMINAL AORTOGRAM;  Surgeon: Corky Crafts, MD;  Location: [facility] INVASIVE CV LAB;  Service: Cardiovascular;  Laterality: N/A;  . ABDOMINAL HYSTERECTOMY    . CARPAL TUNNEL RELEASE    . CORONARY ARTERY BYPASS GRAFT  2009   [facility], LIMA to LAD, SVG to OM, SVG to PDA  . ENDOVASCULAR REPAIR/STENT GRAFT  05/05/2018  . IR ANGIO INTRA EXTRACRAN SEL INTERNAL CAROTID BILAT MOD SED  02/23/2018  . IR ANGIO VERTEBRAL SEL VERTEBRAL BILAT MOD SED  02/23/2018  . IR ANGIO VERTEBRAL SEL VERTEBRAL UNI L MOD SED  03/18/2018  . IR ANGIOGRAM FOLLOW UP STUDY  03/18/2018  . IR ANGIOGRAM FOLLOW UP STUDY  03/18/2018  . IR ANGIOGRAM FOLLOW UP STUDY  03/18/2018  . IR ANGIOGRAM FOLLOW UP STUDY  03/18/2018  . IR TRANSCATH/EMBOLIZ  03/18/2018  . LEFT HEART CATH AND CORS/GRAFTS ANGIOGRAPHY N/A 01/19/2018   Procedure: LEFT HEART CATH AND CORS/GRAFTS ANGIOGRAPHY;  Surgeon: Corky Crafts, MD;  Location: [facility] INVASIVE CV LAB;  Service: Cardiovascular;  Laterality: N/A;  . LEFT HEART CATHETERIZATION WITH CORONARY/GRAFT ANGIOGRAM N/A 12/27/2012   Procedure: LEFT HEART  CATHETERIZATION WITH Isabel Caprice;  Surgeon: Herby Abraham, MD;  Location: [facility] CATH LAB;  Service: Cardiovascular;  Laterality: N/A;  . RADIOLOGY WITH ANESTHESIA N/A 03/18/2018   Procedure: coil embolization of basilar aneurysm;  Surgeon: Lisbeth Renshaw, MD;  Location: [facility] OR;  Service: Radiology;  Laterality: N/A;    Current Outpatient Medications  Medication Sig Dispense Refill  . albuterol (VENTOLIN HFA) 108 (90 Base) MCG/ACT inhaler Inhale 1-2 puffs into the lungs every 6 (six) hours as needed for shortness of breath.     Marland Kitchen amLODipine (NORVASC) 5 MG tablet Take 5 mg by mouth daily.  1  . aspirin  EC 81 MG tablet Take 1 tablet (81 mg total) by mouth daily.    . clopidogrel (PLAVIX) 75 MG tablet Take 1 tablet (75 mg total) by mouth daily. 90 tablet 1  . fluticasone (FLONASE) 50 MCG/ACT nasal spray Place 2 sprays into both nostrils at bedtime.    . hydrALAZINE (APRESOLINE) 100 MG tablet TAKE 1 TABLET BY MOUTH THREE TIMES A DAY 270 tablet 3  . hydrochlorothiazide (HYDRODIURIL) 25 MG tablet Take 1 tablet (25 mg total) by mouth daily. 90 tablet 3  . HYDROcodone-acetaminophen (NORCO) 7.5-325 MG tablet Take 0.5-1 tablets by mouth every 12 (twelve) hours as needed for moderate pain.    . isosorbide mononitrate (IMDUR) 60 MG 24 hr tablet TAKE 1 TABLET BY MOUTH EVERY DAY 90 tablet 1  . losartan (COZAAR) 100 MG tablet TAKE 1 TABLET BY MOUTH EVERY DAY 90 tablet 1  . magnesium oxide (MAG-OX) 400 (241.3 Mg) MG tablet Take 400 mg by mouth daily.   3  . metoprolol tartrate (LOPRESSOR) 25 MG tablet TAKE 1 TABLET BY MOUTH TWICE A DAY 60 tablet 6  . nitroGLYCERIN (NITROSTAT) 0.4 MG SL tablet Place 1 tablet (0.4 mg total) under the tongue every 5 (five) minutes x 3 doses as needed for chest pain. 25 tablet 3  . oxyCODONE-acetaminophen (PERCOCET) 5-325 MG tablet Take 1 tablet by mouth every 6 (six) hours as needed for severe pain. 6 tablet 0  . ranitidine (ZANTAC) 300 MG tablet TAKE 1 TABLET BY MOUTH EVERY DAY AT NIGHT    . simvastatin (ZOCOR) 40 MG tablet TAKE 1 TABLET BY MOUTH EVERYDAY AT BEDTIME 90 tablet 1  . spironolactone (ALDACTONE) 25 MG tablet Take 0.5 tablets (12.5 mg total) by mouth daily. 30 tablet 11  . amLODipine (NORVASC) 5 MG tablet TAKE 1 TABLET BY MOUTH EVERY DAY (Patient not taking: Reported on 08/12/2018) 90 tablet 1   No current facility-administered medications for this visit.    Allergies:  Patient has no known allergies.   Social History: The patient  reports that she quit smoking about 10 years ago. Her smoking use included cigarettes. She started smoking about 52 years ago. She has a  32.25 pack-year smoking history. She has never used smokeless tobacco. She reports that she drinks alcohol. She reports that she does not use drugs.   ROS:  Please see the history of present illness. Otherwise, complete review of systems is positive for fatigue.  All other systems are reviewed and negative.   Physical Exam: VS:  BP (!) 159/78   Pulse 68   Ht 5' 5.5" (1.664 m)   Wt 217 lb (98.4 kg)   SpO2 96%   BMI 35.56 kg/m , BMI Body mass index is 35.56 kg/m.  Wt Readings from Last 3 Encounters:  08/12/18 217 lb (98.4 kg)  05/24/18 214 lb (97.1 kg)  05/10/18  213 lb (96.6 kg)    General: Patient appears comfortable at rest. HEENT: Conjunctiva and lids normal, oropharynx clear. Neck: Supple, no elevated JVP or carotid bruits, no thyromegaly. Lungs: Clear to auscultation, nonlabored breathing at rest. Cardiac: Regular rate and rhythm, no S3 or significant systolic murmur. Abdomen: Soft, nontender, bowel sounds present. Extremities: No pitting edema, distal pulses 2+. Skin: Warm and dry. Musculoskeletal: No kyphosis. Neuropsychiatric: Alert and oriented x3, affect grossly appropriate.  ECG: I personally reviewed the tracing from 01/23/2018 which showed sinus rhythm with PAC, old inferior infarct pattern, nonspecific T wave abnormalities.  Recent Labwork: 01/23/2018: Magnesium 2.0 05/10/2018: ALT 19; AST 19; BUN 17; Creatinine, Ser 0.88; Hemoglobin 13.1; Platelets 247; Potassium 3.7; Sodium 136     Component Value Date/Time   CHOL 85 01/20/2018 0206   TRIG 162 (H) 01/20/2018 0206   HDL 25 (L) 01/20/2018 0206   CHOLHDL 3.4 01/20/2018 0206   VLDL 32 01/20/2018 0206   LDLCALC 28 01/20/2018 0206    Other Studies Reviewed Today:  Cardiac catheterization 01/19/2018:  Prox RCA lesion is 100% stenosed. SVG to PDA is patent.  Prox LAD lesion is 100% stenosed. LIMA to LAD is patent.  Mid LM to Dist LM lesion is 25% stenosed.  Mid Cx lesion is 50% stenosed. SVG to OM is occluded,  known from prior.  The left ventricular systolic function is normal.  LV end diastolic pressure is mildly elevated.  The left ventricular ejection fraction is 50-55% by visual estimate.  There is no aortic valve stenosis.  Infrarenal AAA. PAD including right common iliac artery stenosis.  Bilateral common iliac arteries appear ectatic.  Medical therapy for CAD. Stable from prior cath. No acute lesion detected.  Assessment and Plan:  1.  Multivessel CAD status post CABG with previous DES to the circumflex.  Cardiac catheterization earlier this year revealed patent bypass grafts with occluded SVG to OM but only 50% mid circumflex disease.  She is being managed medically.  Continue with current regimen which includes dual antiplatelet therapy.  2.  Abdominal aortic aneurysm status post endovascular repair with concurrent management of right common iliac stenosis.  She continues to follow with Dr. Myra Gianotti.  3.  Essential hypertension on multimodal therapy.  No changes made to current medication doses.  We discussed weight loss and walking plan for exercise.  4.  Mixed hyperlipidemia, continues on statin therapy.  Current medicines were reviewed with the patient today.   Disposition: Follow-up in 6 months.  Signed, Jonelle Sidle, MD, [facility] 08/12/2018 9:54 AM    [facility] [address] [place], [place] [address] Phone: [phone]; Fax: [phone]

## 2018-08-12 ENCOUNTER — Ambulatory Visit (INDEPENDENT_AMBULATORY_CARE_PROVIDER_SITE_OTHER): Payer: Medicare Other | Admitting: Cardiology

## 2018-08-12 ENCOUNTER — Other Ambulatory Visit: Payer: Self-pay | Admitting: Cardiology

## 2018-08-12 ENCOUNTER — Encounter: Payer: Self-pay | Admitting: Cardiology

## 2018-08-12 VITALS — BP 159/78 | HR 68 | Ht 65.5 in | Wt 217.0 lb

## 2018-08-12 DIAGNOSIS — I714 Abdominal aortic aneurysm, without rupture, unspecified: Secondary | ICD-10-CM

## 2018-08-12 DIAGNOSIS — I200 Unstable angina: Secondary | ICD-10-CM

## 2018-08-12 DIAGNOSIS — I10 Essential (primary) hypertension: Secondary | ICD-10-CM

## 2018-08-12 DIAGNOSIS — E782 Mixed hyperlipidemia: Secondary | ICD-10-CM

## 2018-08-12 DIAGNOSIS — I25119 Atherosclerotic heart disease of native coronary artery with unspecified angina pectoris: Secondary | ICD-10-CM | POA: Diagnosis not present

## 2018-08-12 NOTE — Patient Instructions (Signed)

## 2018-09-22 ENCOUNTER — Other Ambulatory Visit: Payer: Self-pay | Admitting: Cardiology

## 2018-10-15 ENCOUNTER — Other Ambulatory Visit: Payer: Self-pay | Admitting: *Deleted

## 2018-10-15 ENCOUNTER — Other Ambulatory Visit: Payer: Self-pay

## 2018-10-15 DIAGNOSIS — I714 Abdominal aortic aneurysm, without rupture, unspecified: Secondary | ICD-10-CM

## 2018-11-29 ENCOUNTER — Ambulatory Visit: Payer: Medicare Other | Admitting: Surgery

## 2018-11-29 ENCOUNTER — Ambulatory Visit (HOSPITAL_COMMUNITY): Payer: Medicare Other

## 2018-12-08 ENCOUNTER — Other Ambulatory Visit: Payer: Self-pay | Admitting: Cardiology

## 2019-01-03 ENCOUNTER — Ambulatory Visit (HOSPITAL_COMMUNITY): Payer: Medicare Other

## 2019-01-03 ENCOUNTER — Ambulatory Visit: Payer: Medicare Other | Admitting: Surgery

## 2019-01-11 ENCOUNTER — Encounter (INDEPENDENT_AMBULATORY_CARE_PROVIDER_SITE_OTHER): Payer: Self-pay

## 2019-01-25 ENCOUNTER — Other Ambulatory Visit: Payer: Self-pay | Admitting: Cardiology

## 2019-02-05 ENCOUNTER — Other Ambulatory Visit: Payer: Self-pay | Admitting: Cardiology

## 2019-02-11 ENCOUNTER — Other Ambulatory Visit: Payer: Self-pay | Admitting: Cardiology

## 2019-02-17 IMAGING — MR MR HEAD WO/W CM
12 of 14 series · 38 of 48 positions shown · IV contrast (multihance)
Comparison: CT/CTA HEAD and neck January 20, 2011

CLINICAL DATA: Generalized muscle weakness. Unsteady gait. History
of hypertension, hyperlipidemia, aneurysm.

EXAM:
MRI HEAD WITHOUT AND WITH CONTRAST
TECHNIQUE: Multiplanar, multiecho pulse sequences of the brain and surrounding
structures were obtained without and with intravenous contrast.
CONTRAST:  20mL MULTIHANCE GADOBENATE DIMEGLUMINE 529 MG/ML IV SOLN

[Series 5001: DWI · axial · 4.0mm · 0.88mm/px · z∈[-20,+104]mm · 5 of 64 slices shown (1 of 4)]
[im 1/64]
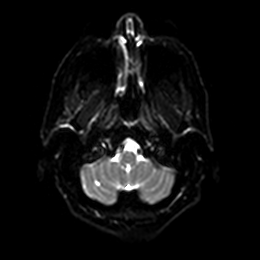
[im 16/64]
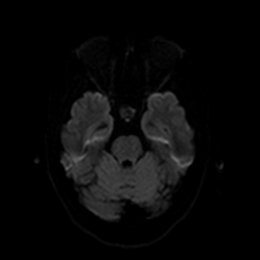
[im 32/64]
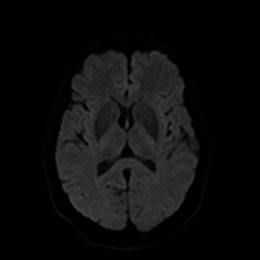
[im 48/64]
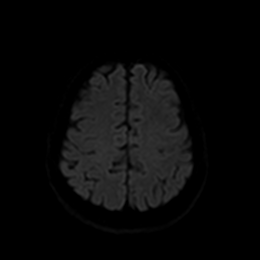
[im 64/64]
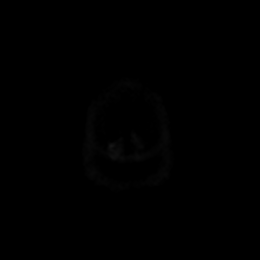

[Series 6001: DWI · axial · 4.0mm · 0.88mm/px · z∈[-20,+104]mm · 3 of 32 slices shown (2 of 4)]
[im 1/32]
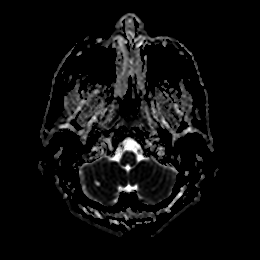
[im 16/32]
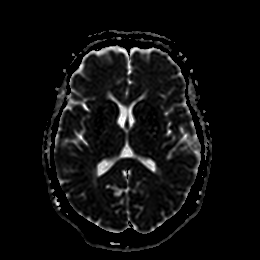
[im 32/32]
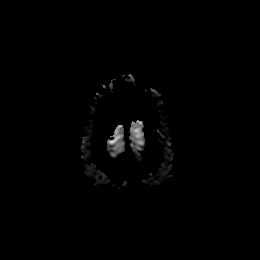

[Series 7001: DWI · coronal · 4.0mm · 0.88mm/px · 6 of 64 slices shown (3 of 4)]
[im 1/64]
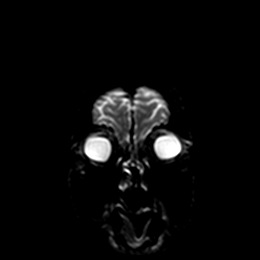
[im 13/64]
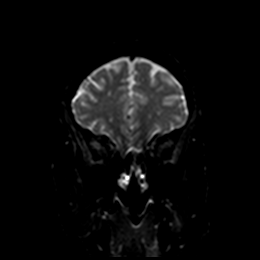
[im 26/64]
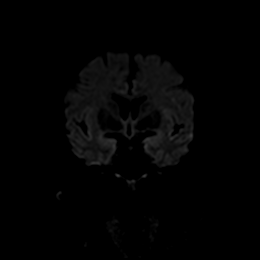
[im 38/64]
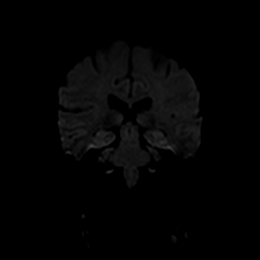
[im 51/64]
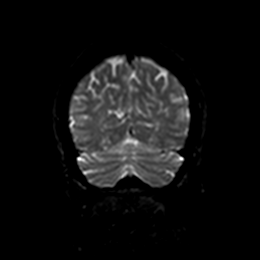
[im 64/64]
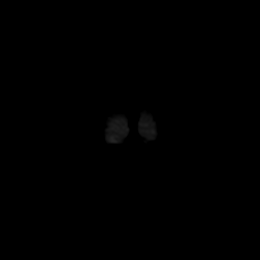

[Series 8001: DWI · coronal · 4.0mm · 0.88mm/px · 3 of 32 slices shown (4 of 4)]
[im 1/32]
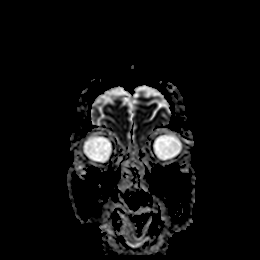
[im 16/32]
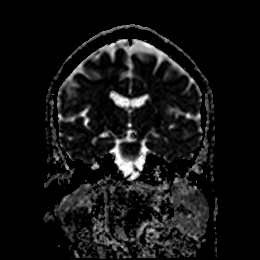
[im 32/32]
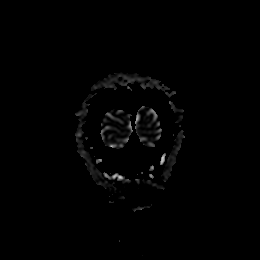

[Series 9001: T1 · sagittal · 5.0mm · 0.75mm/px · 2 of 23 slices shown]
[im 1/23]
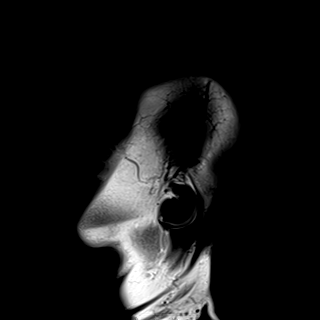
[im 23/23]
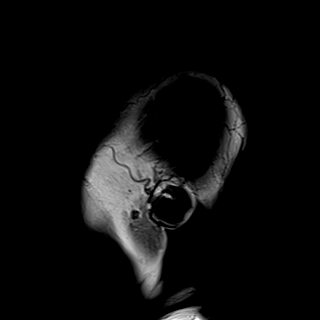

[T2 · axial · 5.0mm · 0.69mm/px · z∈[-31,+112]mm · 2 of 25 slices shown]
[im 1/25]
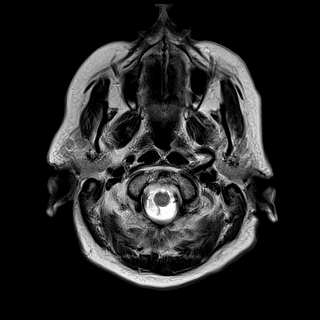
[im 25/25]
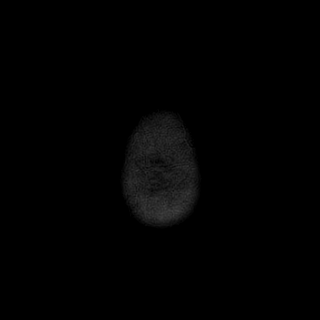

[FLAIR · axial · 5.0mm · 0.43mm/px · z∈[-32,+111]mm · 2 of 25 slices shown]
[im 1/25]
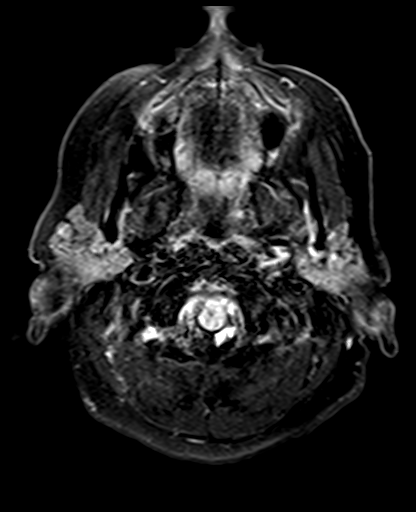
[im 25/25]
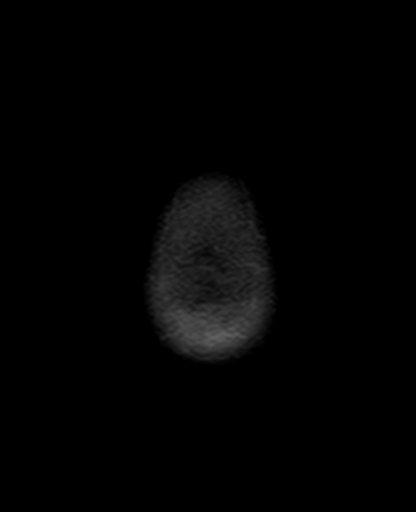

[swi_images · axial · 3.0mm · 0.86mm/px · z∈[-44,+107]mm · 5 of 52 slices shown]
[im 1/52]
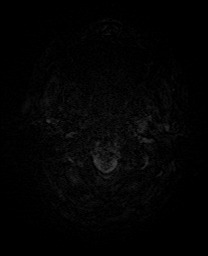
[im 13/52]
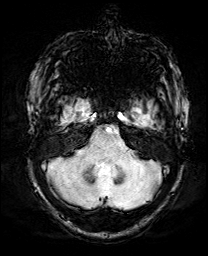
[im 26/52]
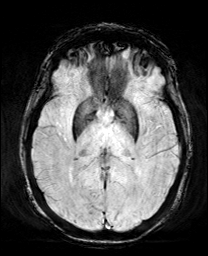
[im 39/52]
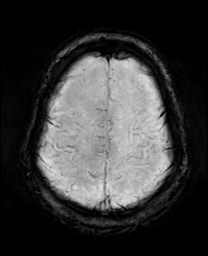
[im 52/52]
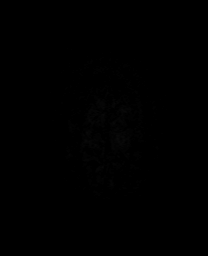

[mip_images(sw) · axial · 24.0mm · 0.86mm/px · z∈[-33,+97]mm · 4 of 45 slices shown]
[im 1/45]
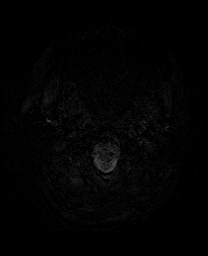
[im 15/45]
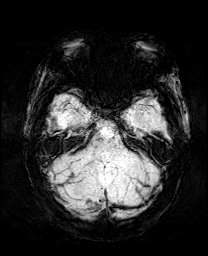
[im 30/45]
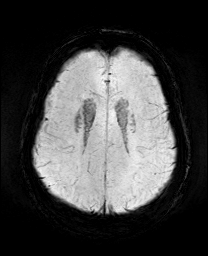
[im 45/45]
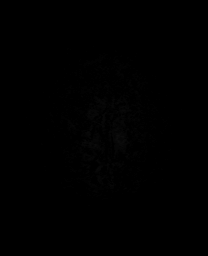

[T2 post-contrast · coronal · 5.0mm · 0.72mm/px · 2 of 28 slices shown]
[im 1/28]
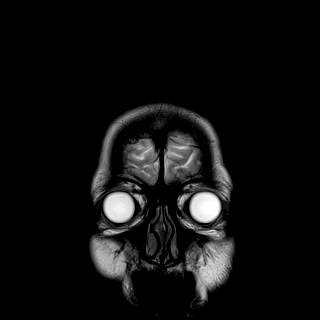
[im 28/28]
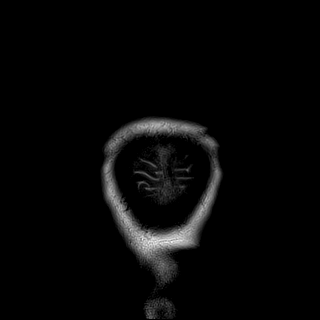

[T1 post-contrast · coronal · 5.0mm · 0.34mm/px · 2 of 28 slices shown (1 of 2)]
[im 1/28]
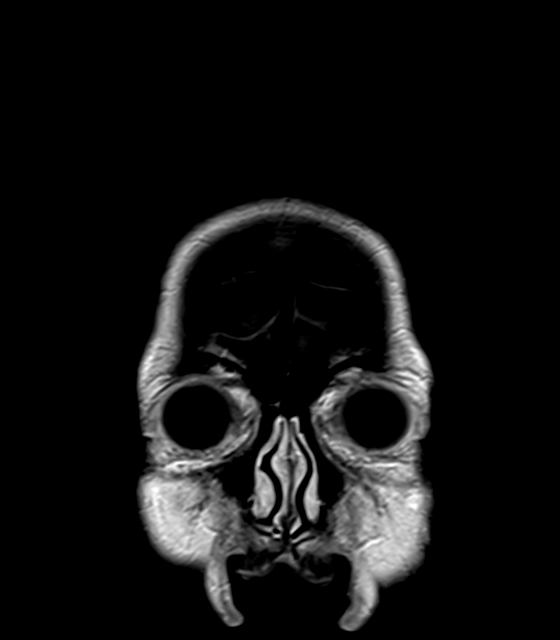
[im 28/28]
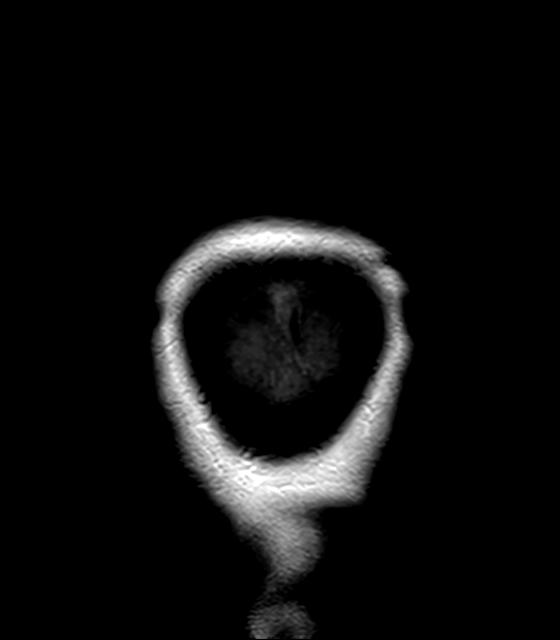

[T1 post-contrast · sagittal · 5.0mm · 0.72mm/px · 2 of 23 slices shown (2 of 2)]
[im 1/23]
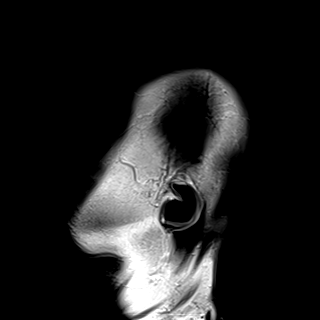
[im 23/23]
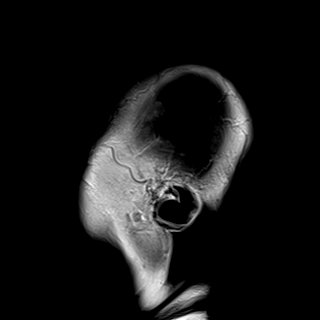

[38 of 48 positions shown; findings below may reference images not displayed]

FINDINGS: INTRACRANIAL CONTENTS: No reduced diffusion to suggest acute
ischemia. No susceptibility artifact to suggest hemorrhage. The
ventricles and sulci are normal for patient's age. Patchy
supratentorial white matter FLAIR T2 hyperintensities. Old small
RIGHT cerebellar infarcts. No suspicious parenchymal signal, masses,
mass effect. No abnormal intraparenchymal or extra-axial
enhancement. No abnormal extra-axial fluid collections. No
extra-axial masses.

VASCULAR: Major intracranial vascular flow voids present at skull
base. Bilobed 9 mm basilar tip aneurysm better characterized on
prior imaging.

SKULL AND UPPER CERVICAL SPINE: No abnormal sellar expansion. No
suspicious calvarial bone marrow signal. Craniocervical junction
maintained.

SINUSES/ORBITS: The paranasal sinuses are well aerated. Trace
mastoid effusions.The included ocular globes and orbital contents
are non-suspicious. Status post bilateral ocular lens implants.

OTHER: None.
IMPRESSION: 1. No acute intracranial process.
2. Old small RIGHT cerebellar infarcts. Mild chronic small vessel
ischemic disease.
3. Redemonstration of basilar tip aneurysm better characterized on
recent CTA HEAD. Neuro-Interventional Radiology consultation is
suggested to evaluate the appropriateness of potential treatment.
Non-emergent evaluation can be arranged by calling [phone]
during usual hours. Emergency evaluation can be requested by paging
[phone].

## 2019-03-02 ENCOUNTER — Other Ambulatory Visit: Payer: Self-pay | Admitting: Neurosurgery

## 2019-03-02 DIAGNOSIS — I671 Cerebral aneurysm, nonruptured: Secondary | ICD-10-CM

## 2019-03-04 ENCOUNTER — Other Ambulatory Visit: Payer: Self-pay | Admitting: Cardiology

## 2019-03-10 ENCOUNTER — Other Ambulatory Visit: Payer: Self-pay | Admitting: Cardiology

## 2019-03-17 ENCOUNTER — Telehealth: Payer: Self-pay | Admitting: Cardiology

## 2019-03-17 ENCOUNTER — Ambulatory Visit (HOSPITAL_COMMUNITY)
Admission: RE | Admit: 2019-03-17 | Discharge: 2019-03-17 | Disposition: A | Discharge status: Home / Self Care | Payer: Medicare Other | Source: Ambulatory Visit | Attending: Surgery | Admitting: Surgery

## 2019-03-17 ENCOUNTER — Other Ambulatory Visit: Payer: Self-pay

## 2019-03-17 ENCOUNTER — Ambulatory Visit (INDEPENDENT_AMBULATORY_CARE_PROVIDER_SITE_OTHER): Payer: Medicare Other | Admitting: Family

## 2019-03-17 ENCOUNTER — Encounter: Payer: Self-pay | Admitting: Family

## 2019-03-17 VITALS — BP 148/80 | HR 68 | Ht 65.0 in | Wt 200.0 lb

## 2019-03-17 DIAGNOSIS — I714 Abdominal aortic aneurysm, without rupture, unspecified: Secondary | ICD-10-CM

## 2019-03-17 DIAGNOSIS — Z95828 Presence of other vascular implants and grafts: Secondary | ICD-10-CM

## 2019-03-17 DIAGNOSIS — I739 Peripheral vascular disease, unspecified: Secondary | ICD-10-CM | POA: Diagnosis not present

## 2019-03-17 DIAGNOSIS — I6523 Occlusion and stenosis of bilateral carotid arteries: Secondary | ICD-10-CM

## 2019-03-17 NOTE — Telephone Encounter (Signed)
Virtual Visit Pre-Appointment Phone Call  "(Name), I am calling you today to discuss your upcoming appointment. We are currently trying to limit exposure to the virus that causes COVID-19 by seeing patients at home rather than in the office."  1. "What is the BEST phone number to call the day of the visit?" - include this in appointment notes  2. Do you have or have access to (through a family member/friend) a smartphone with video capability that we can use for your visit?" a. If yes - list this number in appt notes as cell (if different from BEST phone #) and list the appointment type as a VIDEO visit in appointment notes b. If no - list the appointment type as a PHONE visit in appointment notes  3. Confirm consent - "In the setting of the current Covid19 crisis, you are scheduled for a (phone or video) visit with your provider on (date) at (time).  Just as we do with many in-office visits, in order for you to participate in this visit, we must obtain consent.  If you'd like, I can send this to your mychart (if signed up) or email for you to review.  Otherwise, I can obtain your verbal consent now.  All virtual visits are billed to your insurance company just like a normal visit would be.  By agreeing to a virtual visit, we'd like you to understand that the technology does not allow for your provider to perform an examination, and thus may limit your provider's ability to fully assess your condition. If your provider identifies any concerns that need to be evaluated in person, we will make arrangements to do so.  Finally, though the technology is pretty good, we cannot assure that it will always work on either your or our end, and in the setting of a video visit, we may have to convert it to a phone-only visit.  In either situation, we cannot ensure that we have a secure connection.  Are you willing to proceed?" STAFF: Did the patient verbally acknowledge consent to telehealth visit? Document  YES/NO here: yes  4. Advise patient to be prepared - "Two hours prior to your appointment, go ahead and check your blood pressure, pulse, oxygen saturation, and your weight (if you have the equipment to check those) and write them all down. When your visit starts, your provider will ask you for this information. If you have an Apple Watch or Kardia device, please plan to have heart rate information ready on the day of your appointment. Please have a pen and paper handy nearby the day of the visit as well."  5. Give patient instructions for MyChart download to smartphone OR Doximity/Doxy.me as below if video visit (depending on what platform provider is using)  6. Inform patient they will receive a phone call 15 minutes prior to their appointment time (may be from unknown caller ID) so they should be prepared to answer    TELEPHONE CALL NOTE  Heather Livingston has been deemed a candidate for a follow-up tele-health visit to limit community exposure during the Covid-19 pandemic. I spoke with the patient via phone to ensure availability of phone/video source, confirm preferred email & phone number, and discuss instructions and expectations.  I reminded Heather Livingston to be prepared with any vital sign and/or heart rhythm information that could potentially be obtained via home monitoring, at the time of her visit. I reminded Heather Livingston to expect a phone call prior to  her visit.  [name] 03/17/2019 2:34 PM   INSTRUCTIONS FOR DOWNLOADING THE MYCHART APP TO SMARTPHONE  - The patient must first make sure to have activated MyChart and know their login information - If Apple, go to [employer] Store and type in MyChart in the search bar and download the app. If Android, ask patient to go to [employer] Play Store and type in [facility] in the search bar and download the app. The app is free but as with any other app downloads, their phone may require them to verify saved payment information or Apple/Android  password.  - The patient will need to then log into the app with their MyChart username and password, and select [facility] as their healthcare provider to link the account. When it is time for your visit, go to the MyChart app, find appointments, and click Begin Video Visit. Be sure to Select Allow for your device to access the Microphone and Camera for your visit. You will then be connected, and your provider will be with you shortly.  **If they have any issues connecting, or need assistance please contact MyChart service desk [phone])**  **If using a computer, in order to ensure the best quality for their visit they will need to use either of the following Internet Browsers: [employer] Edge, or Google Chrome**  IF USING DOXIMITY or DOXY.ME - The patient will receive a link just prior to their visit by text.     FULL LENGTH CONSENT FOR TELE-HEALTH VISIT   I hereby voluntarily request, consent and authorize [facility] and its employed or contracted physicians, physician assistants, nurse practitioners or other licensed health care professionals (the Practitioner), to provide me with telemedicine health care services (the Services") as deemed necessary by the treating Practitioner. I acknowledge and consent to receive the Services by the Practitioner via telemedicine. I understand that the telemedicine visit will involve communicating with the Practitioner through live audiovisual communication technology and the disclosure of certain medical information by electronic transmission. I acknowledge that I have been given the opportunity to request an in-person assessment or other available alternative prior to the telemedicine visit and am voluntarily participating in the telemedicine visit.  I understand that I have the right to withhold or withdraw my consent to the use of telemedicine in the course of my care at any time, without affecting my right to future care or treatment,  and that the Practitioner or I may terminate the telemedicine visit at any time. I understand that I have the right to inspect all information obtained and/or recorded in the course of the telemedicine visit and may receive copies of available information for a reasonable fee.  I understand that some of the potential risks of receiving the Services via telemedicine include:   Delay or interruption in medical evaluation due to technological equipment failure or disruption;  Information transmitted may not be sufficient (e.g. poor resolution of images) to allow for appropriate medical decision making by the Practitioner; and/or   In rare instances, security protocols could fail, causing a breach of personal health information.  Furthermore, I acknowledge that it is my responsibility to provide information about my medical history, conditions and care that is complete and accurate to the best of my ability. I acknowledge that Practitioner's advice, recommendations, and/or decision may be based on factors not within their control, such as incomplete or inaccurate data provided by me or distortions of diagnostic images or specimens that may result from electronic transmissions. I  understand that the practice of medicine is not an exact science and that Practitioner makes no warranties or guarantees regarding treatment outcomes. I acknowledge that I will receive a copy of this consent concurrently upon execution via email to the email address I last provided but may also request a printed copy by calling the office of [place].    I understand that my insurance will be billed for this visit.   I have read or had this consent read to me.  I understand the contents of this consent, which adequately explains the benefits and risks of the Services being provided via telemedicine.   I have been provided ample opportunity to ask questions regarding this consent and the Services and have had my questions  answered to my satisfaction.  I give my informed consent for the services to be provided through the use of telemedicine in my medical care  By participating in this telemedicine visit I agree to the above.

## 2019-03-17 NOTE — Progress Notes (Signed)
Virtual Visit via Telephone Note   I connected with Heather Livingston on 03/17/2019 using the [email or web address] by telephone and verified that I was speaking with the correct person using two identifiers. Patient was located at her home and accompanied by herself. I am located at [address], [place], [place], [facility] office.  The limitations of evaluation and management by telemedicine and the availability of in person appointments have been previously discussed with the patient and are documented in the patients chart. The patient expressed understanding and consented to proceed.  PCP: Theodoro Kos, MD  CC: Follow up s/p Endovascular Repair of Abdominal Aortic Aneurysm   History of Present Illness  Heather Livingston is a 68 y.o. ([birth year]) female who is status post endovascular abdominal aortic aneurysm repair performed on 05/05/2018 by Dr. Myra Gianotti.  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 Dr. Myra Gianotti felt that if he were to treat her iliac stenosis percutaneously that he would also need to entertain treating the aneurysm so as not to preclude endovascular options in the future.  She tolerated procedure well.  She did go to the emergency room after her operation because of some numbness in her foot however this was not attributed to her aneurysm repair.  She states she has known back problems and arthritis.   May 2019 carotid duplex demonstrated minimal bilateral ICA stenosis. May 2019 ABI's demonstrated mild disease in the right with biphasic waveforms and normal in the left.   She had her EVAR duplex today.   Dr. Myra Gianotti last evaluated pt on 05-24-18. At that time CT scan showed widely patent stent.  She was to follow-up in 6 months with ultrasound.  He discussed that if she has persistent neuropathic issues in her right leg that spine surgery consultation could be obtained.  CT in 2019  demonstrates: type 2 endoleak and 3.7 cm sac size.    Her other  medical problems include CAD with hx of CABG in 2009 and MI, hx of brain aneurysm, OSA (on CPAP), and silent stroke (evidence found on CT).   She reports stable LUQ and ribs pain for the last 3-4 months, no exacerbating or alleviating features.  Pt states she has had abdominal gas for about a year, she feels her stomach is bloated. Is taking fiber daily.   Her medications include a daily statin, 81 mg ASA, Plavix, and a beta blocker.   Diabetic: No Tobaccos use: former smoker, quit in 2009, 3/4 ppd x 43 years    Past Medical History:  Diagnosis Date  . Abdominal aortic aneurysm (AAA) (HCC)   . Arthritis    back  . Bradycardia   . Brain aneurysm   . Cataracts, bilateral   . Coronary atherosclerosis of native coronary artery    DES circ 10/10, residual 100% occluded SVG to OM, patent LIMA to LAD and SVG to PDA grafts, LVEF 50%  . GERD (gastroesophageal reflux disease)   . History of hiatal hernia   . Hyperlipemia   . Hypertension   . Low iron   . MI (myocardial infarction) (HCC)   . Nonruptured cerebral aneurysm   . OSA (obstructive sleep apnea)    On CPAP  . Palpitations    Transient PAT/AF, CHADS2 score 1  . Pneumonia   . Restless legs   . Stroke [facility]    found on CT scan, pt not aware she had had them   Past Surgical History:  Procedure Laterality Date  . ABDOMINAL AORTIC ENDOVASCULAR STENT GRAFT N/A 05/05/2018   Procedure: ABDOMINAL AORTIC ENDOVASCULAR STENT GRAFT;  Surgeon: Nada LibmanBrabham, Vance W, MD;  [facility];  Service: Vascular;  Laterality: N/A;  . ABDOMINAL AORTOGRAM N/A 01/19/2018   Procedure: ABDOMINAL AORTOGRAM;  Surgeon: Corky CraftsVaranasi, Jayadeep S, MD;  [facility];  Service: Cardiovascular;  Laterality: N/A;  . ABDOMINAL HYSTERECTOMY    . CARPAL TUNNEL RELEASE    . CORONARY ARTERY [facility]PASS [facility]RAFT  [facility] LIMA to LAD, SVG to OM, SVG to PDA  . ENDOVASCULAR REPAIR/STENT GRAFT  05/05/2018  . IR ANGIO INTRA EXTRACRAN SEL INTERNAL CAROTID  BILAT MOD SED  02/23/2018  . IR ANGIO VERTEBRAL SEL VERTEBRAL BILAT MOD SED  02/23/2018  . IR ANGIO VERTEBRAL SEL VERTEBRAL UNI L MOD SED  03/18/2018  . IR ANGIOGRAM FOLLOW UP STUDY  03/18/2018  . IR ANGIOGRAM FOLLOW UP STUDY  03/18/2018  . IR ANGIOGRAM FOLLOW UP STUDY  03/18/2018  . IR ANGIOGRAM FOLLOW UP STUDY  03/18/2018  . IR TRANSCATH/EMBOLIZ  03/18/2018  . LEFT HEART CATH AND CORS/GRAFTS ANGIOGRAPHY N/A 01/19/2018   Procedure: LEFT HEART CATH AND CORS/GRAFTS ANGIOGRAPHY;  Surgeon: Corky CraftsVaranasi, Jayadeep S, MD;  [facility];  Service: Cardiovascular;  Laterality: N/A;  . LEFT HEART CATHETERIZATION WITH CORONARY/GRAFT ANGIOGRAM N/A 12/27/2012   Procedure: LEFT HEART CATHETERIZATION WITH Isabel CapriceORONARY/GRAFT ANGIOGRAM;  Surgeon: Herby Abrahamhomas D Stuckey, MD;  [facility];  Service: Cardiovascular;  Laterality: N/A;  . RADIOLOGY WITH ANESTHESIA N/A 03/18/2018   Procedure: coil embolization of basilar aneurysm;  Surgeon: Lisbeth RenshawNundkumar, Neelesh, MD;  [facility];  Service: Radiology;  Laterality: N/A;   Social History Social History   Tobacco Use  . Smoking status: Former Smoker    Packs/day: 0.75    Years: 43.00    Pack years: 32.25    Types: Cigarettes    Start date: 09/13/1965    Last attempt to quit: 10/14/2007    Years since quitting: 11.4  . Smokeless tobacco: Never Used  Substance Use Topics  . Alcohol use: Yes    Alcohol/week: 0.0 standard drinks    Comment: Occasional wine or beer  . Drug use: No   Family History Family History  Problem Relation Age of Onset  . Hypertension Mother   . Heart attack Father   . Hypertension Brother    Current Outpatient Medications on File Prior to Visit  Medication Sig Dispense Refill  . albuterol (VENTOLIN HFA) 108 (90 Base) MCG/ACT inhaler Inhale 1-2 puffs into the lungs every 6 (six) hours as needed for shortness of breath.     Marland Kitchen. amLODipine (NORVASC) 5 MG tablet Take 5 mg by mouth daily.  1  . amLODipine (NORVASC) 5 MG tablet TAKE 1 TABLET  BY MOUTH EVERY DAY 90 tablet 1  . aspirin EC 81 MG tablet Take 1 tablet (81 mg total) by mouth daily.    . clopidogrel (PLAVIX) 75 MG tablet TAKE 1 TABLET BY MOUTH EVERY DAY 30 tablet 0  . fluticasone (FLONASE) 50 MCG/ACT nasal spray Place 2 sprays into both nostrils at bedtime.    . hydrALAZINE (APRESOLINE) 100 MG tablet TAKE 1 TABLET BY MOUTH THREE TIMES A DAY 270 tablet 3  . hydrochlorothiazide (HYDRODIURIL) 25 MG tablet Take 1 tablet (25 mg total) by mouth daily. 90 tablet 3  . HYDROcodone-acetaminophen (NORCO) 7.5-325 MG tablet Take 0.5-1 tablets by mouth every 12 (twelve) hours as needed for moderate  pain.    . isosorbide mononitrate (IMDUR) 60 MG 24 hr tablet TAKE 1 TABLET BY MOUTH EVERY DAY 90 tablet 1  . losartan (COZAAR) 100 MG tablet TAKE 1 TABLET BY MOUTH EVERY DAY 90 tablet 1  . magnesium oxide (MAG-OX) 400 (241.3 Mg) MG tablet Take 400 mg by mouth daily.   3  . metoprolol tartrate (LOPRESSOR) 25 MG tablet TAKE 1 TABLET BY MOUTH TWICE A DAY 180 tablet 2  . nitroGLYCERIN (NITROSTAT) 0.4 MG SL tablet Place 1 tablet (0.4 mg total) under the tongue every 5 (five) minutes x 3 doses as needed for chest pain. 25 tablet 3  . oxyCODONE-acetaminophen (PERCOCET) 5-325 MG tablet Take 1 tablet by mouth every 6 (six) hours as needed for severe pain. 6 tablet 0  . ranitidine (ZANTAC) 300 MG tablet TAKE 1 TABLET BY MOUTH EVERY DAY AT NIGHT    . simvastatin (ZOCOR) 40 MG tablet TAKE 1 TABLET BY MOUTH EVERYDAY AT BEDTIME 90 tablet 0  . spironolactone (ALDACTONE) 25 MG tablet Take 0.5 tablets (12.5 mg total) by mouth daily. 30 tablet 11   No current facility-administered medications on file prior to visit.    No Known Allergies   12 system ROS was negative unless otherwise noted in HPI    DATA  Observations/Objective:  EVAR Duplex  Current (Date: 03-17-19) Endovascular Aortic Repair (EVAR): +----------+----------------+----------------+------------------+--------------+           Diameter  AP (cm)Diameter Trans  Velocities        Comments                                 (cm)            (cm/sec)                         +----------+----------------+----------------+------------------+--------------+ Aorta     2.89            2.52            77                               +----------+----------------+----------------+------------------+--------------+ Right Limb1.32                            122                              +----------+----------------+----------------+------------------+--------------+ Left Limb                                                   not visualized +----------+----------------+----------------+------------------+--------------+ Summary: Abdominal Aorta: Patent endovascular aneurysm repair with no evidence of endoleak.    CTA Abd/Pelvis Duplex (Date: 05-21-18) Stable aneurysm sac size post EVAR with maximal sac dimensions of 3.5 x 3.7 cm. There is evidence of a focal type 2 endoleak within the posterior aspect of the aneurysm sac with inflow and outflow via L3 and L4 lumbar arteries. The endograft is normally patent and well positioned with no evidence of type 1 endoleak. Right common iliac artery stenosis has been successfully treated with widely patent endograft limb present.  NON-VASCULAR 1. Evidence of elevated right heart pressures with reflux of contrast into the intrahepatic IVC and hepatic veins. 2. Stable appearance of hyperplastic left adrenal gland.    Assessment and Plan: Heather Livingston is a 68 y.o. female who presents s/p EVAR (Date: 05-05-18).  Pt is asymptomatic with a decrease in sac size to 2.9 cm today on duplex (based on limited visualization due to bowel gas), compared to 3.7 cm by CT on 05-21-18.  For her LUQ abdominal pain with known abdominal gas issue: I advised her to discuss this with her PCP.     Follow Up Instructions: The next endograft duplex will be scheduled for 12 months. Will also  schedule carotid duplex and ABI's for a year.    I discussed the assessment and treatment plan with the patient. The patient was provided an opportunity to ask questions and all were answered. The patient agreed with the plan and demonstrated an understanding of the instructions.   The patient was advised to call back or seek an in-person evaluation if the symptoms worsen or if the condition fails to improve as anticipated.  I spent 15 minutes with the patient via telephone encounter.   Donnalee Curry [facility]el [facility] Office: [phone]  03/17/2019, 8:21 AM

## 2019-03-18 ENCOUNTER — Telehealth: Payer: Medicare Other | Admitting: Cardiology

## 2019-03-29 ENCOUNTER — Other Ambulatory Visit: Payer: Self-pay | Admitting: Neurosurgery

## 2019-04-09 ENCOUNTER — Other Ambulatory Visit: Payer: Self-pay | Admitting: Cardiology

## 2019-04-11 ENCOUNTER — Other Ambulatory Visit: Payer: Self-pay | Admitting: *Deleted

## 2019-04-11 MED ORDER — SPIRONOLACTONE 25 MG PO TABS: 12.5000 mg | ORAL_TABLET | Freq: Every day | ORAL | 1 refills | Status: DC

## 2019-04-13 ENCOUNTER — Other Ambulatory Visit: Payer: Self-pay | Admitting: Cardiology

## 2019-04-13 NOTE — H&P (Signed)
Chief Complaint   Aneurysm   HPI   HPI: Heather Livingston is a 68 y.o. female Approximately 1 year status post coil embolization of incidentally discovered unruptured basilar up aneurysm.  She presents today for routine diagnostic cerebral angiogram for continued monitoring.  She is without any concerns.  She denies any symptoms.  Patient Active Problem List   Diagnosis Date Noted  . AAA (abdominal aortic aneurysm) (HCC) 05/05/2018  . Cerebral aneurysm, nonruptured 03/18/2018  . Chest pain 01/23/2018  . GERD (gastroesophageal reflux disease) 01/23/2018  . OSA (obstructive sleep apnea) 01/23/2018  . Aneurysm of infrarenal abdominal aorta (HCC) 01/21/2018  . 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 Unstable angina (HCC) 01/19/2018  . Essential hypertension, benign 10/29/2010  . CORONARY ATHEROSCLEROSIS NATIVE CORONARY ARTERY 10/29/2010  . SUPRAVENTRICULAR TACHYCARDIA 09/13/2010  . Hyperlipemia 08/27/2010  . 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 Palpitations 08/27/2010    PMH: Past Medical History:  Diagnosis Date  . Abdominal aortic aneurysm (AAA) (HCC)   . Arthritis    back  . Bradycardia   . Brain aneurysm   . Cataracts, bilateral   . Coronary atherosclerosis of native coronary artery    DES circ 10/10, residual 100% occluded SVG to OM, patent LIMA to LAD and SVG to PDA grafts, LVEF 50%  . GERD (gastroesophageal reflux disease)   . History of hiatal hernia   . Hyperlipemia   . Hypertension   . Low iron   . MI (myocardial infarction) (HCC)   . Nonruptured cerebral aneurysm   . OSA (obstructive sleep apnea)    On CPAP  . Palpitations    Transient PAT/AF, CHADS2 score 1  . Pneumonia   . Restless legs   . Stroke [facility](HCC)    found on CT scan, pt not aware she had had them    PSH: Past Surgical History:  Procedure Laterality Date  . ABDOMINAL AORTIC ENDOVASCULAR STENT GRAFT N/A 05/05/2018   Procedure: ABDOMINAL AORTIC ENDOVASCULAR STENT GRAFT;  Surgeon: Nada LibmanBrabham, Vance  W, MD;  [facility];  Service: Vascular;  Laterality: N/A;  . ABDOMINAL AORTOGRAM N/A 01/19/2018   Procedure: ABDOMINAL AORTOGRAM;  Surgeon: Corky CraftsVaranasi, Jayadeep S, MD;  [facility];  Service: Cardiovascular;  Laterality: N/A;  . ABDOMINAL HYSTERECTOMY    . CARPAL TUNNEL RELEASE    . CORONARY ARTERY BYPASS GRAFT  [facility] LIMA to LAD, SVG to OM, SVG to PDA  . ENDOVASCULAR REPAIR/STENT GRAFT  05/05/2018  . IR ANGIO INTRA EXTRACRAN SEL INTERNAL CAROTID BILAT MOD SED  02/23/2018  . IR ANGIO VERTEBRAL SEL VERTEBRAL BILAT MOD SED  02/23/2018  . IR ANGIO VERTEBRAL SEL VERTEBRAL UNI L MOD SED  03/18/2018  . IR ANGIOGRAM FOLLOW UP STUDY  03/18/2018  . IR ANGIOGRAM FOLLOW UP STUDY  03/18/2018  . IR ANGIOGRAM FOLLOW UP STUDY  03/18/2018  . IR ANGIOGRAM FOLLOW UP STUDY  03/18/2018  . IR TRANSCATH/EMBOLIZ  03/18/2018  . LEFT HEART CATH AND CORS/GRAFTS ANGIOGRAPHY N/A 01/19/2018   Procedure: LEFT HEART CATH AND CORS/GRAFTS ANGIOGRAPHY;  Surgeon: Corky CraftsVaranasi, Jayadeep S, MD;  [facility];  Service: Cardiovascular;  Laterality: N/A;  . LEFT HEART CATHETERIZATION WITH CORONARY/GRAFT ANGIOGRAM N/A 12/27/2012   Procedure: LEFT HEART CATHETERIZATION WITH Isabel CapriceORONARY/GRAFT ANGIOGRAM;  Surgeon: Herby Abrahamhomas D Stuckey, MD;  [facility];  Service: Cardiovascular;  Laterality: N/A;  . RADIOLOGY WITH ANESTHESIA N/A 03/18/2018   Procedure: coil embolization of basilar aneurysm;  Surgeon: Conchita ParisNundkumar,  Nena Polio, MD;  Location: [place];  Service: Radiology;  Laterality: N/A;    (Not in a hospital admission)   SH: Social History   Tobacco Use  . Smoking status: Former Smoker    Packs/day: 0.75    Years: 43.00    Pack years: 32.25    Types: Cigarettes    Start date: 09/13/1965    Quit date: 10/14/2007    Years since quitting: 11.5  . Smokeless tobacco: Never Used  Substance Use Topics  . Alcohol use: Yes    Alcohol/week: 0.0 standard drinks    Comment: Occasional wine or beer  . Drug use:  No    MEDS: Prior to Admission medications   Medication Sig Start Date End Date Taking? Authorizing Provider  albuterol (VENTOLIN HFA) 108 (90 Base) MCG/ACT inhaler Inhale 1-2 puffs into the lungs every 6 (six) hours as needed for shortness of breath.  12/19/16   [provider]  amLODipine (NORVASC) 5 MG tablet TAKE 1 TABLET BY MOUTH EVERY DAY 01/25/19   Satira Sark, MD  aspirin EC 81 MG tablet Take 1 tablet (81 mg total) by mouth daily. 01/14/13   Donney Dice, PA-C  clopidogrel (PLAVIX) 75 MG tablet TAKE 1 TABLET BY MOUTH EVERY DAY 04/11/19   Satira Sark, MD  fluticasone [facility]) 50 MCG/ACT nasal spray Place 2 sprays into both nostrils at bedtime.    [provider]  hydrALAZINE (APRESOLINE) 100 MG tablet TAKE 1 TABLET BY MOUTH THREE TIMES A DAY 06/25/18   Satira Sark, MD  hydrochlorothiazide (HYDRODIURIL) 25 MG tablet Take 1 tablet (25 mg total) by mouth daily. 06/25/18   Satira Sark, MD  HYDROcodone-acetaminophen (NORCO) 7.5-325 MG tablet Take 0.5-1 tablets by mouth every 12 (twelve) hours as needed for moderate pain.    [provider]  isosorbide mononitrate (IMDUR) 60 MG 24 hr tablet TAKE 1 TABLET BY MOUTH EVERY DAY 01/25/19   Satira Sark, MD  losartan (COZAAR) 100 MG tablet TAKE 1 TABLET BY MOUTH EVERY DAY 02/07/19   Satira Sark, MD  magnesium oxide (MAG-OX) 400 (241.3 Mg) MG tablet Take 400 mg by mouth daily.  06/03/16   [provider]  metoprolol tartrate (LOPRESSOR) 25 MG tablet TAKE 1 TABLET BY MOUTH TWICE A DAY 09/22/18   Satira Sark, MD  nitroGLYCERIN (NITROSTAT) 0.4 MG SL tablet Place 1 tablet (0.4 mg total) under the tongue every 5 (five) minutes x 3 doses as needed for chest pain. 02/05/16   Satira Sark, MD  oxyCODONE-acetaminophen (PERCOCET) 5-325 MG tablet Take 1 tablet by mouth every 6 (six) hours as needed for severe pain. 05/06/18 05/06/19  Ulyses Amor, PA-C  ranitidine (ZANTAC) 300 MG  tablet TAKE 1 TABLET BY MOUTH EVERY DAY AT NIGHT 01/08/18   [provider]  simvastatin (ZOCOR) 40 MG tablet TAKE 1 TABLET BY MOUTH EVERYDAY AT BEDTIME 03/04/19   Satira Sark, MD  spironolactone (ALDACTONE) 25 MG tablet Take 0.5 tablets (12.5 mg total) by mouth daily. 04/11/19 04/10/20  Satira Sark, MD    ALLERGY: No Known Allergies  Social History   Tobacco Use  . Smoking status: Former Smoker    Packs/day: 0.75    Years: 43.00    Pack years: 32.25    Types: Cigarettes    Start date: 09/13/1965    Quit date: 10/14/2007    Years since quitting: 11.5  . Smokeless tobacco: Never Used  Substance Use Topics  .  Alcohol use: Yes    Alcohol/week: 0.0 standard drinks    Comment: Occasional wine or beer     Family History  Problem Relation Age of Onset  . Hypertension Mother   . Heart attack Father   . Hypertension Brother      ROS   ROS  Exam   There were no vitals filed for this visit. General appearance: WDWN, NAD Eyes: No scleral injection Cardiovascular: Regular rate and rhythm without murmurs, rubs, gallops. No edema or variciosities. Distal pulses normal. Pulmonary: Effort normal, non-labored breathing Musculoskeletal:     Muscle tone upper extremities: Normal    Muscle tone lower extremities: Normal    Motor exam: Upper Extremities Deltoid Bicep Tricep Grip  Right 5/5 5/5 5/5 5/5  Left 5/5 5/5 5/5 5/5   Lower Extremity IP Quad PF DF EHL  Right 5/5 5/5 5/5 5/5 5/5  Left 5/5 5/5 5/5 5/5 5/5   Neurological Mental Status:    - Patient is awake, alert, oriented to person, place, month, year, and situation    - Patient is able to give a clear and coherent history.    - No signs of aphasia or neglect Cranial Nerves    - II: Visual Fields are full. PERRL    - III/IV/VI: EOMI without ptosis or diploplia.     - V: Facial sensation is grossly normal    - VII: Facial movement is symmetric.     - VIII: hearing is intact to voice    - X: Uvula  elevates symmetrically    - XI: Shoulder shrug is symmetric.    - XII: tongue is midline without atrophy or fasciculations.  Sensory: Sensation grossly intact to LT Results - Imaging/Labs   No results found for this or any previous visit (from the past 48 hour(s)).  No results found.  Impression/Plan   68 y.o. female   approximately 1 year status post coil embolization of incidentally discovered unruptured basilar aneurysm.  We will proceed with diagnostic cerebral angiogram for routine monitoring.    While in the office risks, benefits and alternatives to the procedure were discussed.   Patient stated understanding and wished to proceed.  Cindra PresumeVincent Syenna Nazir, PA-C [facility]

## 2019-04-14 ENCOUNTER — Other Ambulatory Visit: Payer: Self-pay

## 2019-04-14 ENCOUNTER — Other Ambulatory Visit: Payer: Self-pay | Admitting: Neurosurgery

## 2019-04-14 ENCOUNTER — Ambulatory Visit (HOSPITAL_COMMUNITY)
Admission: RE | Admit: 2019-04-14 | Discharge: 2019-04-14 | Disposition: A | Discharge status: Home / Self Care | Payer: Medicare Other | Source: Ambulatory Visit | Attending: Neurosurgery | Admitting: Neurosurgery

## 2019-04-14 ENCOUNTER — Encounter (HOSPITAL_COMMUNITY): Payer: Self-pay | Admitting: Neurosurgery

## 2019-04-14 DIAGNOSIS — Z8249 Family history of ischemic heart disease and other diseases of the circulatory system: Secondary | ICD-10-CM | POA: Diagnosis not present

## 2019-04-14 DIAGNOSIS — M1990 Unspecified osteoarthritis, unspecified site: Secondary | ICD-10-CM | POA: Insufficient documentation

## 2019-04-14 DIAGNOSIS — I10 Essential (primary) hypertension: Secondary | ICD-10-CM | POA: Insufficient documentation

## 2019-04-14 DIAGNOSIS — I2510 Atherosclerotic heart disease of native coronary artery without angina pectoris: Secondary | ICD-10-CM | POA: Insufficient documentation

## 2019-04-14 DIAGNOSIS — Z95828 Presence of other vascular implants and grafts: Secondary | ICD-10-CM | POA: Diagnosis not present

## 2019-04-14 DIAGNOSIS — Z7902 Long term (current) use of antithrombotics/antiplatelets: Secondary | ICD-10-CM | POA: Diagnosis not present

## 2019-04-14 DIAGNOSIS — Z951 Presence of aortocoronary bypass graft: Secondary | ICD-10-CM | POA: Diagnosis not present

## 2019-04-14 DIAGNOSIS — I671 Cerebral aneurysm, nonruptured: Secondary | ICD-10-CM

## 2019-04-14 DIAGNOSIS — G4733 Obstructive sleep apnea (adult) (pediatric): Secondary | ICD-10-CM | POA: Insufficient documentation

## 2019-04-14 DIAGNOSIS — E785 Hyperlipidemia, unspecified: Secondary | ICD-10-CM | POA: Diagnosis not present

## 2019-04-14 DIAGNOSIS — Z7982 Long term (current) use of aspirin: Secondary | ICD-10-CM | POA: Insufficient documentation

## 2019-04-14 DIAGNOSIS — I471 Supraventricular tachycardia: Secondary | ICD-10-CM | POA: Insufficient documentation

## 2019-04-14 DIAGNOSIS — Z87891 Personal history of nicotine dependence: Secondary | ICD-10-CM | POA: Insufficient documentation

## 2019-04-14 DIAGNOSIS — G2581 Restless legs syndrome: Secondary | ICD-10-CM | POA: Diagnosis not present

## 2019-04-14 DIAGNOSIS — Z8673 Personal history of transient ischemic attack (TIA), and cerebral infarction without residual deficits: Secondary | ICD-10-CM | POA: Diagnosis not present

## 2019-04-14 DIAGNOSIS — I252 Old myocardial infarction: Secondary | ICD-10-CM | POA: Insufficient documentation

## 2019-04-14 DIAGNOSIS — K219 Gastro-esophageal reflux disease without esophagitis: Secondary | ICD-10-CM | POA: Insufficient documentation

## 2019-04-14 DIAGNOSIS — Z90710 Acquired absence of both cervix and uterus: Secondary | ICD-10-CM | POA: Diagnosis not present

## 2019-04-14 DIAGNOSIS — I714 Abdominal aortic aneurysm, without rupture: Secondary | ICD-10-CM | POA: Insufficient documentation

## 2019-04-14 DIAGNOSIS — Z79899 Other long term (current) drug therapy: Secondary | ICD-10-CM | POA: Insufficient documentation

## 2019-04-14 HISTORY — PX: IR US GUIDE VASC ACCESS RIGHT: IMG2390

## 2019-04-14 HISTORY — PX: IR ANGIO VERTEBRAL SEL SUBCLAVIAN INNOMINATE UNI R MOD SED: IMG5365

## 2019-04-14 LAB — CBC WITH DIFFERENTIAL/PLATELET
Abs Immature Granulocytes: 0.01 10*3/uL (ref 0.00–0.07)
Basophils Absolute: 0.1 10*3/uL (ref 0.0–0.1)
Basophils Relative: 2 %
Eosinophils Absolute: 0.1 10*3/uL (ref 0.0–0.5)
Eosinophils Relative: 3 %
HCT: 43.2 % (ref 36.0–46.0)
Hemoglobin: 14.5 g/dL (ref 12.0–15.0)
Immature Granulocytes: 0 %
Lymphocytes Relative: 37 %
Lymphs Abs: 1.5 10*3/uL (ref 0.7–4.0)
MCH: 31.6 pg (ref 26.0–34.0)
MCHC: 33.6 g/dL (ref 30.0–36.0)
MCV: 94.1 fL (ref 80.0–100.0)
Monocytes Absolute: 0.5 10*3/uL (ref 0.1–1.0)
Monocytes Relative: 13 %
Neutro Abs: 1.8 10*3/uL (ref 1.7–7.7)
Neutrophils Relative %: 45 %
Platelets: 196 10*3/uL (ref 150–400)
RBC: 4.59 MIL/uL (ref 3.87–5.11)
RDW: 13.1 % (ref 11.5–15.5)
WBC: 3.9 10*3/uL — ABNORMAL LOW (ref 4.0–10.5)
nRBC: 0 % (ref 0.0–0.2)

## 2019-04-14 LAB — BASIC METABOLIC PANEL
Anion gap: 10 (ref 5–15)
BUN: 15 mg/dL (ref 8–23)
CO2: 24 mmol/L (ref 22–32)
Calcium: 9.8 mg/dL (ref 8.9–10.3)
Chloride: 105 mmol/L (ref 98–111)
Creatinine, Ser: 0.87 mg/dL (ref 0.44–1.00)
GFR calc Af Amer: >60 mL/min (ref >60)
GFR calc non Af Amer: >60 mL/min (ref >60)
Glucose, Bld: 106 mg/dL — ABNORMAL HIGH (ref 70–99)
Potassium: 3.6 mmol/L (ref 3.5–5.1)
Sodium: 139 mmol/L (ref 135–145)

## 2019-04-14 LAB — PROTIME-INR
INR: 0.9 (ref 0.8–1.2)
Prothrombin Time: 12.5 seconds (ref 11.4–15.2)

## 2019-04-14 LAB — APTT: aPTT: 33 seconds (ref 24–36)

## 2019-04-14 MED ORDER — FENTANYL CITRATE (PF) 100 MCG/2ML IJ SOLN
INTRAMUSCULAR | Status: AC
  Filled 2019-04-14: qty 2

## 2019-04-14 MED ORDER — NITROGLYCERIN 1 MG/10 ML FOR IR/CATH LAB
INTRA_ARTERIAL | Status: AC
  Administered 2019-04-14: 10:00:00 600 ug
  Filled 2019-04-14: qty 10

## 2019-04-14 MED ORDER — VERAPAMIL HCL 2.5 MG/ML IV SOLN
INTRAVENOUS | Status: AC
  Administered 2019-04-14: 5 mg
  Filled 2019-04-14: qty 2

## 2019-04-14 MED ORDER — IOHEXOL 300 MG/ML  SOLN
100.0000 mL | Freq: Once | INTRAMUSCULAR | Status: AC | PRN
  Administered 2019-04-14: 45 mL via INTRAVENOUS

## 2019-04-14 MED ORDER — MIDAZOLAM HCL 2 MG/2ML IJ SOLN
INTRAMUSCULAR | Status: AC | PRN
  Administered 2019-04-14: 1 mg via INTRAVENOUS

## 2019-04-14 MED ORDER — IOHEXOL 300 MG/ML  SOLN: 100.0000 mL | Freq: Once | INTRAMUSCULAR | Status: DC | PRN

## 2019-04-14 MED ORDER — HEPARIN SODIUM (PORCINE) 1000 UNIT/ML IJ SOLN
INTRAMUSCULAR | Status: AC | PRN
  Administered 2019-04-14: 3000 [IU] via INTRAVENOUS

## 2019-04-14 MED ORDER — MIDAZOLAM HCL 2 MG/2ML IJ SOLN
INTRAMUSCULAR | Status: AC
  Filled 2019-04-14: qty 2

## 2019-04-14 MED ORDER — SODIUM CHLORIDE 0.9 % IV SOLN
INTRAVENOUS | Status: AC | PRN
  Administered 2019-04-14: 10 mL/h via INTRAVENOUS

## 2019-04-14 MED ORDER — IOHEXOL 300 MG/ML  SOLN
50.0000 mL | Freq: Once | INTRAMUSCULAR | Status: AC | PRN
  Administered 2019-04-14: 10:00:00 10 mL via INTRAVENOUS

## 2019-04-14 MED ORDER — HEPARIN SODIUM (PORCINE) 1000 UNIT/ML IJ SOLN
INTRAMUSCULAR | Status: AC
  Filled 2019-04-14: qty 4

## 2019-04-14 MED ORDER — FENTANYL CITRATE (PF) 100 MCG/2ML IJ SOLN
INTRAMUSCULAR | Status: AC | PRN
  Administered 2019-04-14 (×2): 25 ug via INTRAVENOUS

## 2019-04-14 MED ORDER — LIDOCAINE HCL 1 % IJ SOLN
INTRAMUSCULAR | Status: AC
  Filled 2019-04-14: qty 20

## 2019-04-14 MED ORDER — LIDOCAINE HCL (PF) 1 % IJ SOLN
INTRAMUSCULAR | Status: AC | PRN
  Administered 2019-04-14: 1 mL

## 2019-04-14 NOTE — Brief Op Note (Signed)
  NEUROSURGERY BRIEF OPERATIVE  NOTE   PREOP DX: Basilar aneurysm  POSTOP DX: Same  PROCEDURE: Diagnostic cerebral angiogram  SURGEON: Dr. Consuella Lose, MD  ANESTHESIA: IV Sedation with Local  EBL: Minimal  SPECIMENS: None  COMPLICATIONS: None  CONDITION: Stable to recovery  FINDINGS (Full report in [facility]): 1. No aneurysm residual or recurrence identified.

## 2019-04-14 NOTE — Discharge Instructions (Addendum)
Radial Site Care ° °This sheet gives you information about how to care for yourself after your procedure. Your health care provider may also give you more specific instructions. If you have problems or questions, contact your health care provider. °What can I expect after the procedure? °After the procedure, it is common to have: °· Bruising and tenderness at the catheter insertion area. °Follow these instructions at home: °Medicines °· Take over-the-counter and prescription medicines only as told by your health care provider. °Insertion site care °· Follow instructions from your health care provider about how to take care of your insertion site. Make sure you: °? Wash your hands with soap and water before you change your bandage (dressing). If soap and water are not available, use hand sanitizer. °? Change your dressing as told by your health care provider. °? Leave stitches (sutures), skin glue, or adhesive strips in place. These skin closures may need to stay in place for 2 weeks or longer. If adhesive strip edges start to loosen and curl up, you may trim the loose edges. Do not remove adhesive strips completely unless your health care provider tells you to do that. °· Check your insertion site every day for signs of infection. Check for: °? Redness, swelling, or pain. °? Fluid or blood. °? Pus or a bad smell. °? Warmth. °· Do not take baths, swim, or use a hot tub until your health care provider approves. °· You may shower 24-48 hours after the procedure, or as directed by your health care provider. °? Remove the dressing and gently wash the site with plain soap and water. °? Pat the area dry with a clean towel. °? Do not rub the site. That could cause bleeding. °· Do not apply powder or lotion to the site. °Activity ° °· For 24 hours after the procedure, or as directed by your health care provider: °? Do not flex or bend the affected arm. °? Do not push or pull heavy objects with the affected arm. °? Do not  drive yourself home from the hospital or clinic. You may drive 24 hours after the procedure unless your health care provider tells you not to. °? Do not operate machinery or power tools. °· Do not lift anything that is heavier than 10 lb (4.5 kg), or the limit that you are told, until your health care provider says that it is safe. °· Ask your health care provider when it is okay to: °? Return to work or school. °? Resume usual physical activities or sports. °? Resume sexual activity. °General instructions °· If the catheter site starts to bleed, raise your arm and put firm pressure on the site. If the bleeding does not stop, get help right away. This is a medical emergency. °· If you went home on the same day as your procedure, a responsible adult should be with you for the first 24 hours after you arrive home. °· Keep all follow-up visits as told by your health care provider. This is important. °Contact a health care provider if: °· You have a fever. °· You have redness, swelling, or yellow drainage around your insertion site. °Get help right away if: °· You have unusual pain at the radial site. °· The catheter insertion area swells very fast. °· The insertion area is bleeding, and the bleeding does not stop when you hold steady pressure on the area. °· Your arm or hand becomes pale, cool, tingly, or numb. °These symptoms may represent a serious problem   that is an emergency. Do not wait to see if the symptoms will go away. Get medical help right away. Call your local emergency services (911 in the [place] Do not drive yourself to the hospital. °Summary °· After the procedure, it is common to have bruising and tenderness at the site. °· Follow instructions from your health care provider about how to take care of your radial site wound. Check the wound every day for signs of infection. °· Do not lift anything that is heavier than 10 lb (4.5 kg), or the limit that you are told, until your health care provider says  that it is safe. °This information is not intended to replace advice given to you by your health care provider. Make sure you discuss any questions you have with your health care provider. °Document Released: 11/01/2010 Document Revised: 11/04/2017 Document Reviewed: 11/04/2017 °Elsevier Patient Education © 2020 [employer] °Cerebral Angiogram ° °A cerebral angiogram is a procedure that is used to examine the blood vessels in the brain and neck. In this procedure, contrast dye is injected through a long, thin tube (catheter) into an artery. X-rays are then taken, which show if there is a blockage or problem in a blood vessel. °Tell a health care provider about: °· Any allergies you have, including allergies to shellfish, contrast dye, or iodine °· All medicines you are taking, including vitamins, herbs, eye drops, creams, and over-the-counter medicines. °· Any problems you or family members have had with anesthetic medicines. °· Any blood disorders you have. °· Any surgeries you have had. °· Any medical conditions you have. °· Whether you are pregnant or may be pregnant. °· Whether you are currently breastfeeding. °What are the risks? °Generally, this is a safe procedure. However, problems may occur, including: °· Damage to surrounding nerves, tissues, or structures. °· Blood clot. °· Inability to remember what happened (amnesia). This is usually temporary. °· Weakness, numbness, speech, or vision problems. This is usually temporary. °· Stroke. °· Kidney injury. °· Bleeding or bruising. °· Allergic reaction medicines or dyes. °· Infection. °What happens before the procedure? °Staying hydrated °Follow instructions from your health care provider about hydration, which may include: °· Up to 2 hours before the procedure - you may continue to drink clear liquids, such as water, clear fruit juice, black coffee, and plain tea. °Eating and drinking restrictions °Follow instructions from your health care provider about  eating and drinking, which may include: °· 8 hours before the procedure - stop eating heavy meals or foods such as meat, fried foods, or fatty foods. °· 6 hours before the procedure - stop eating light meals or foods, such as toast or cereal. °· 6 hours before the procedure - stop drinking milk or drinks that contain milk. °· 2 hours before the procedure - stop drinking clear liquids. °General instructions °· Ask your health care provider about: °? Changing or stopping your regular medicines. This is especially important if you are taking diabetes medicines or blood thinners. °? Taking medicines such as aspirin or ibuprofen. These medicines can thin your blood. Do not take these medicines before your procedure if your health care provider asks you not to. °· You may have blood tests done. °· Plan to have someone take you home from the hospital or clinic. °· If you will be going home right after the procedure, plan to have someone with you for 24 hours. °What happens during the procedure? °· To reduce your risk of infection: °? Your health care   team will wash or sanitize their hands. °? Your skin will be washed with soap. °? Hair may be removed from the surgical area. °· You will lie on your back on an imaging bed with an X-ray machine around you. °· Your head will be secured to the bed with a strap or device to help you keep still. °· An IV tube will be inserted into one of your veins. °· You will be given one or more of the following: °? A medicine to help you relax (sedative). °? A medicine to numb the area (local anesthetic) where the catheter will be inserted. This is usually your groin, leg, or arm. °· Your heart rate and other vital signs will be watched carefully. You may have electrodes placed on your chest. °· A small cut (incision) will be made where the catheter will be inserted. °· The catheter will be inserted into an artery that leads to the head. You may feel slight pressure. °· The catheter will be  moved through the body up to your neck and brain. X-ray images will help your health care provider bring the catheter to the correct location. °· The dye will be injected into the catheter and will travel to your head or neck area. You may feel a warming or burning sensation or notice a strange taste in your mouth as the dye goes through your system. °· Images will be taken to show how the dye flows through the area. °· While the images are being taken, you may be given instructions on breathing, swallowing, moving, or talking. °· When the images are finished, the catheter will be slowly removed. °· Pressure will be applied to the skin to stop any bleeding. A tight bandage (dressing) or seal will be applied to the skin. °· Your IV will be removed. °The procedure may vary among health care providers and hospitals. °What happens after the procedure? °· Your blood pressure, heart rate, breathing rate, and blood oxygen level will be monitored until the medicines you were given have worn off. °· You will be asked to lie flat for several hours. The arm or leg where the catheter was inserted will need to be kept straight while you are in the recovery room. °· The insertion site will be watched for bleeding and you will be checked often. °· You will be instructed to drink plenty of fluids. This will help wash the contrast dye out of your system. °· Do not drive for 24 hours if you received a sedative. °· It is up to you to get the results of your procedure. Ask your health care provider, or the department that is doing the procedure, when your results will be ready. °Summary °· A cerebral angiogram is a procedure that is used to examine the blood vessels in the brain and neck. °· In this procedure, contrast dye is injected through a long, thin tube (catheter) into an artery. X-rays are then taken, which show if there is a blockage or problem in a blood vessel. °· You will be given a sedative to help you relax during the  procedure. A local anesthetic will be used to numb the area where the catheter is inserted. You may feel pressure when the catheter is inserted, and you may feel a warm sensation when the dye is injected. °· After the procedure, you will be asked to lie flat for several hours. The arm or leg where the catheter was inserted will need to be kept straight   while you are in the recovery room. °This information is not intended to replace advice given to you by your health care provider. Make sure you discuss any questions you have with your health care provider. °Document Released: 02/13/2014 Document Revised: 09/11/2017 Document Reviewed: 11/03/2016 °Elsevier Patient Education © 2020 Elsevier Inc. °Moderate Conscious Sedation, Adult, Care After °These instructions provide you with information about caring for yourself after your procedure. Your health care provider may also give you more specific instructions. Your treatment has been planned according to current medical practices, but problems sometimes occur. Call your health care provider if you have any problems or questions after your procedure. °What can I expect after the procedure? °After your procedure, it is common: °· To feel sleepy for several hours. °· To feel clumsy and have poor balance for several hours. °· To have poor judgment for several hours. °· To vomit if you eat too soon. °Follow these instructions at home: °For at least 24 hours after the procedure: ° °· Do not: °? Participate in activities where you could fall or become injured. °? Drive. °? Use heavy machinery. °? Drink alcohol. °? Take sleeping pills or medicines that cause drowsiness. °? Make important decisions or sign legal documents. °? Take care of children on your own. °· Rest. °Eating and drinking °· Follow the diet recommended by your health care provider. °· If you vomit: °? Drink water, juice, or soup when you can drink without vomiting. °? Make sure you have little or no nausea  before eating solid foods. °General instructions °· Have a responsible adult stay with you until you are awake and alert. °· Take over-the-counter and prescription medicines only as told by your health care provider. °· If you smoke, do not smoke without supervision. °· Keep all follow-up visits as told by your health care provider. This is important. °Contact a health care provider if: °· You keep feeling nauseous or you keep vomiting. °· You feel light-headed. °· You develop a rash. °· You have a fever. °Get help right away if: °· You have trouble breathing. °This information is not intended to replace advice given to you by your health care provider. Make sure you discuss any questions you have with your health care provider. °Document Released: 07/20/2013 Document Revised: 09/11/2017 Document Reviewed: 01/19/2016 °Elsevier Patient Education © 2020 Elsevier Inc. ° °

## 2019-04-14 NOTE — Sedation Documentation (Signed)
TR band to right wrist

## 2019-04-14 NOTE — Progress Notes (Signed)
Discharge instructions reviewed with pt and her husband via telephone. Voices understanding.

## 2019-05-31 ENCOUNTER — Other Ambulatory Visit: Payer: Self-pay | Admitting: Cardiology

## 2019-06-06 ENCOUNTER — Telehealth: Payer: Self-pay | Admitting: Cardiology

## 2019-06-06 NOTE — Progress Notes (Signed)
Virtual Visit via Telephone Note   This visit type was conducted due to national recommendations for restrictions regarding the COVID-19 Pandemic (e.g. social distancing) in an effort to limit this patient's exposure and mitigate transmission in our community.  Due to her co-morbid illnesses, this patient is at least at moderate risk for complications without adequate follow up.  This format is felt to be most appropriate for this patient at this time.  The patient did not have access to video technology/had technical difficulties with video requiring transitioning to audio format only (telephone).  All issues noted in this document were discussed and addressed.  No physical exam could be performed with this format.  Please refer to the patient's chart for her  consent to telehealth for [facility].   Date:  06/07/2019   ID:  Heather Livingston, DOB [birth year], MRN [id number]  Patient Location: Home Provider Location: Office  PCP:  Galen Manila, MD  Cardiologist:  Rozann Lesches, MD Electrophysiologist:  None   Evaluation Performed:  Follow-Up Visit  Chief Complaint:  Cardiac follow-up  History of Present Illness:    Heather Livingston is a 68 y.o. female last seen in October 2019.  We spoke by phone today.  She tells me that she has done relatively well from a cardiac perspective, no active angina symptoms or nitroglycerin use.  She remains functional with ADLs.  States that she always wears a mask when she goes out.  Cardiac catheterization in April 2019 revealed patent bypass grafts with the exception of an occluded SVG to OM but only 50% mid circumflex stenosis. Medical therapy is being pursued.  I reviewed her current cardiac regimen which is outlined below.  She reports compliance and no obvious intolerances.  She plans to have lipids followed up with PCP soon.  She had follow-up with [facility] in June.  I reviewed the note.  The patient does not have symptoms concerning for COVID-19  infection (fever, chills, cough, or new shortness of breath).    Past Medical History:  Diagnosis Date  . Abdominal aortic aneurysm (AAA) ([place])   . Arthritis    back  . Bradycardia   . Brain aneurysm   . Cataracts, bilateral   . Coronary atherosclerosis of native coronary artery    DES circ 10/10, residual 100% occluded SVG to OM, patent LIMA to LAD and SVG to PDA grafts, LVEF 50%  . GERD (gastroesophageal reflux disease)   . History of hiatal hernia   . Hyperlipemia   . Hypertension   . Low iron   . MI (myocardial infarction) ([place])   . Nonruptured cerebral aneurysm   . OSA (obstructive sleep apnea)    On CPAP  . Palpitations    Transient PAT/AF, CHADS2 score 1  . Pneumonia   . Restless legs   . Stroke [facility])    found on CT scan, pt not aware she had had them   Past Surgical History:  Procedure Laterality Date  . ABDOMINAL AORTIC ENDOVASCULAR STENT GRAFT N/A 05/05/2018   Procedure: ABDOMINAL AORTIC ENDOVASCULAR STENT GRAFT;  Surgeon: Serafina Mitchell, MD;  Location: [facility];  Service: Vascular;  Laterality: N/A;  . ABDOMINAL AORTOGRAM N/A 01/19/2018   Procedure: ABDOMINAL AORTOGRAM;  Surgeon: Jettie Booze, MD;  Location: [facility];  Service: Cardiovascular;  Laterality: N/A;  . ABDOMINAL HYSTERECTOMY    . CARPAL TUNNEL RELEASE    . CORONARY ARTERY BYPASS GRAFT  2009   [facility], LIMA to  LAD, SVG to OM, SVG to PDA  . ENDOVASCULAR REPAIR/STENT GRAFT  05/05/2018  . IR ANGIO INTRA EXTRACRAN SEL INTERNAL CAROTID BILAT MOD SED  02/23/2018  . IR ANGIO VERTEBRAL SEL SUBCLAVIAN INNOMINATE UNI R MOD SED  04/14/2019  . IR ANGIO VERTEBRAL SEL VERTEBRAL BILAT MOD SED  02/23/2018  . IR ANGIO VERTEBRAL SEL VERTEBRAL UNI L MOD SED  03/18/2018  . IR ANGIOGRAM FOLLOW UP STUDY  03/18/2018  . IR ANGIOGRAM FOLLOW UP STUDY  03/18/2018  . IR ANGIOGRAM FOLLOW UP STUDY  03/18/2018  . IR ANGIOGRAM FOLLOW UP STUDY  03/18/2018  . IR TRANSCATH/EMBOLIZ  03/18/2018  . IR US GUIDE VASC ACCESS RIGHT   04/14/2019  . LEFT HEART CATH AND CORS/GRAFTS ANGIOGRAPHY N/A 01/19/2018   Procedure: LEFT HEART CATH AND CORS/GRAFTS ANGIOGRAPHY;  Surgeon: Corky CraftsVaranasi, Jayadeep S, MD;  [facility];  Service: Cardiovascular;  Laterality: N/A;  . LEFT HEART CATHETERIZATION WITH CORONARY/GRAFT ANGIOGRAM N/A 12/27/2012   Procedure: LEFT HEART CATHETERIZATION WITH Isabel CapriceORONARY/GRAFT ANGIOGRAM;  Surgeon: Herby Abrahamhomas D Stuckey, MD;  [facility];  Service: Cardiovascular;  Laterality: N/A;  . RADIOLOGY WITH ANESTHESIA N/A 03/18/2018   Procedure: coil embolization of basilar aneurysm;  Surgeon: Lisbeth RenshawNundkumar, Neelesh, MD;  [name];  Service: Radiology;  Laterality: N/A;     Current Meds  Medication Sig  . albuterol (VENTOLIN HFA) 108 (90 Base) MCG/ACT inhaler Inhale 1-2 puffs into the lungs every 6 (six) hours as needed for shortness of breath.   Marland Kitchen. amLODipine (NORVASC) 5 MG tablet TAKE 1 TABLET BY MOUTH EVERY DAY  . aspirin EC 81 MG tablet Take 1 tablet (81 mg total) by mouth daily.  . cefUROXime (CEFTIN) 250 MG tablet Take 1 tablet by mouth 2 (two) times daily.  . clopidogrel (PLAVIX) 75 MG tablet TAKE 1 TABLET BY MOUTH EVERY DAY  . fluticasone (FLONASE) 50 MCG/ACT nasal spray Place 2 sprays into both nostrils at bedtime.  . hydrALAZINE (APRESOLINE) 100 MG tablet TAKE 1 TABLET BY MOUTH THREE TIMES A DAY  . hydrochlorothiazide (HYDRODIURIL) 25 MG tablet Take 1 tablet (25 mg total) by mouth daily.  . isosorbide mononitrate (IMDUR) 60 MG 24 hr tablet TAKE 1 TABLET BY MOUTH EVERY DAY  . losartan (COZAAR) 100 MG tablet TAKE 1 TABLET BY MOUTH EVERY DAY  . magnesium oxide (MAG-OX) 400 (241.3 Mg) MG tablet Take 400 mg by mouth daily.   . metoprolol tartrate (LOPRESSOR) 25 MG tablet TAKE 1 TABLET BY MOUTH TWICE A DAY  . nitroGLYCERIN (NITROSTAT) 0.4 MG SL tablet Place 1 tablet (0.4 mg total) under the tongue every 5 (five) minutes x 3 doses as needed for chest pain (if no relief after 3rd dose, proceed to the ED  for an evaluation or call 911).  . simvastatin (ZOCOR) 40 MG tablet TAKE 1 TABLET BY MOUTH EVERYDAY AT BEDTIME  . spironolactone (ALDACTONE) 25 MG tablet Take 0.5 tablets (12.5 mg total) by mouth daily.  . [DISCONTINUED] nitroGLYCERIN (NITROSTAT) 0.4 MG SL tablet Place 1 tablet (0.4 mg total) under the tongue every 5 (five) minutes x 3 doses as needed for chest pain.     Allergies:   Patient has no known allergies.   Social History   Tobacco Use  . Smoking status: Former Smoker    Packs/day: 0.75    Years: 43.00    Pack years: 32.25    Types: Cigarettes    Start date: 09/13/1965    Quit date: 10/14/2007  Years since quitting: 11.6  . Smokeless tobacco: Never Used  Substance Use Topics  . Alcohol use: Yes    Alcohol/week: 0.0 standard drinks    Comment: Occasional wine or beer  . Drug use: No     Family Hx: The patient's family history includes Heart attack in her father; Hypertension in her brother and mother.  ROS:   Please see the history of present illness.    Right-sided sciatica with pending consultation. All other systems reviewed and are negative.   Prior CV studies:   The following studies were reviewed today:  Cardiac catheterization 01/19/2018:  Prox RCA lesion is 100% stenosed. SVG to PDA is patent.  Prox LAD lesion is 100% stenosed. LIMA to LAD is patent.  Mid LM to Dist LM lesion is 25% stenosed.  Mid Cx lesion is 50% stenosed. SVG to OM is occluded, known from prior.  The left ventricular systolic function is normal.  LV end diastolic pressure is mildly elevated.  The left ventricular ejection fraction is 50-55% by visual estimate.  There is no aortic valve stenosis.  Infrarenal AAA. PAD including right common iliac artery stenosis.  Bilateral common iliac arteries appear ectatic.  Medical therapy for CAD. Stable from prior cath. No acute lesion detected.  Labs/Other Tests and Data Reviewed:    EKG:  An ECG dated 01/23/2018 was personally  reviewed today and demonstrated:  Sinus rhythm with PAC, old inferior infarct pattern, nonspecific T wave changes.  Recent Labs: 04/14/2019: BUN 15; Creatinine, Ser 0.87; Hemoglobin 14.5; Platelets 196; Potassium 3.6; Sodium 139   Recent Lipid Panel Lab Results  Component Value Date/Time   CHOL 85 01/20/2018 02:06 AM   TRIG 162 (H) 01/20/2018 02:06 AM   HDL 25 (L) 01/20/2018 02:06 AM   CHOLHDL 3.4 01/20/2018 02:06 AM   LDLCALC 28 01/20/2018 02:06 AM    Wt Readings from Last 3 Encounters:  06/07/19 196 lb (88.9 kg)  04/14/19 206 lb (93.4 kg)  03/17/19 200 lb (90.7 kg)     Objective:    Vital Signs:  BP 140/86   Ht 5\' 5"  (1.651 m)   Wt 196 lb (88.9 kg)   BMI 32.62 kg/m    Patient spoke in full sentences, not short of breath. No audible wheezing or coughing. Speech pattern normal.  ASSESSMENT & PLAN:    1.  Multivessel CAD status post CABG and prior DES to the circumflex.  Cardiac catheterization from April of last year is outlined above with plan to continue medical therapy.  She is doing well without recurring angina on current regimen. Refill provided for fresh bottle of nitroglycerin.  2.  Essential hypertension, continue with current medications.  3.  Mixed hyperlipidemia on Zocor.  She is due for follow-up lipids with PCP.  LDL was 28 in April of last year.  4.  Abdominal aortic aneurysm status post endovascular repair with concurrent management of right common iliac stenosis.  She continues to follow with Dr. Myra GianottiBrabham.  COVID-19 Education: The signs and symptoms of COVID-19 were discussed with the patient and how to seek care for testing (follow up with PCP or arrange E-visit).  The importance of social distancing was discussed today.  Time:   Today, I have spent 6 minutes with the patient with telehealth technology discussing the above problems.     Medication Adjustments/Labs and Tests Ordered: Current medicines are reviewed at length with the patient today.   Concerns regarding medicines are outlined above.   Tests Ordered: No  orders of the defined types were placed in this encounter.   Medication Changes: Meds ordered this encounter  Medications  . nitroGLYCERIN (NITROSTAT) 0.4 MG SL tablet    Sig: Place 1 tablet (0.4 mg total) under the tongue every 5 (five) minutes x 3 doses as needed for chest pain (if no relief after 3rd dose, proceed to the ED for an evaluation or call 911).    Dispense:  25 tablet    Refill:  3    Follow Up:  In Person 6 months in the [place] office.  Signed, Nona DellSamuel Ledarius Leeson, MD  06/07/2019 8:55 AM    [facility]

## 2019-06-06 NOTE — Telephone Encounter (Signed)
Virtual Visit Pre-Appointment Phone Call  "(Name), I am calling you today to discuss your upcoming appointment. We are currently trying to limit exposure to the virus that causes COVID-19 by seeing patients at home rather than in the office."  1. "What is the BEST phone number to call the day of the visit?" - include this in appointment notes  2. Do you have or have access to (through a family member/friend) a smartphone with video capability that we can use for your visit?" a. If yes - list this number in appt notes as cell (if different from BEST phone #) and list the appointment type as a VIDEO visit in appointment notes b. If no - list the appointment type as a PHONE visit in appointment notes  3. Confirm consent - "In the setting of the current Covid19 crisis, you are scheduled for a (phone or video) visit with your provider on (date) at (time).  Just as we do with many in-office visits, in order for you to participate in this visit, we must obtain consent.  If you'd like, I can send this to your mychart (if signed up) or email for you to review.  Otherwise, I can obtain your verbal consent now.  All virtual visits are billed to your insurance company just like a normal visit would be.  By agreeing to a virtual visit, we'd like you to understand that the technology does not allow for your provider to perform an examination, and thus may limit your provider's ability to fully assess your condition. If your provider identifies any concerns that need to be evaluated in person, we will make arrangements to do so.  Finally, though the technology is pretty good, we cannot assure that it will always work on either your or our end, and in the setting of a video visit, we may have to convert it to a phone-only visit.  In either situation, we cannot ensure that we have a secure connection.  Are you willing to proceed?" STAFF: Did the patient verbally acknowledge consent to telehealth visit? Document  YES/NO here: yes  4. Advise patient to be prepared - "Two hours prior to your appointment, go ahead and check your blood pressure, pulse, oxygen saturation, and your weight (if you have the equipment to check those) and write them all down. When your visit starts, your provider will ask you for this information. If you have an Apple Watch or Kardia device, please plan to have heart rate information ready on the day of your appointment. Please have a pen and paper handy nearby the day of the visit as well."  5. Give patient instructions for MyChart download to smartphone OR Doximity/Doxy.me as below if video visit (depending on what platform provider is using)  6. Inform patient they will receive a phone call 15 minutes prior to their appointment time (may be from unknown caller ID) so they should be prepared to answer    TELEPHONE CALL NOTE  Heather Livingston has been deemed a candidate for a follow-up tele-health visit to limit community exposure during the Covid-19 pandemic. I spoke with the patient via phone to ensure availability of phone/video source, confirm preferred email & phone number, and discuss instructions and expectations.  I reminded Heather Livingston to be prepared with any vital sign and/or heart rhythm information that could potentially be obtained via home monitoring, at the time of her visit. I reminded Heather Livingston to expect a phone call prior to  her visit.  [name] 06/06/2019 2:25 PM   INSTRUCTIONS FOR DOWNLOADING THE MYCHART APP TO SMARTPHONE  - The patient must first make sure to have activated MyChart and know their login information - If Apple, go to [employer] and type in MyChart in the search bar and download the app. If Android, ask patient to go to [employer] and type in [place] in the search bar and download the app. The app is free but as with any other app downloads, their phone may require them to verify saved payment information or Apple/Android  password.  - The patient will need to then log into the app with their MyChart username and password, and select [facility] as their healthcare provider to link the account. When it is time for your visit, go to the MyChart app, find appointments, and click Begin Video Visit. Be sure to Select Allow for your device to access the Microphone and Camera for your visit. You will then be connected, and your provider will be with you shortly.  **If they have any issues connecting, or need assistance please contact MyChart service desk [phone])**  **If using a computer, in order to ensure the best quality for their visit they will need to use either of the following Internet Browsers: [employer], or Google Chrome**  IF USING DOXIMITY or DOXY.ME - The patient will receive a link just prior to their visit by text.     FULL LENGTH CONSENT FOR TELE-HEALTH VISIT   I hereby voluntarily request, consent and authorize [facility] and its employed or contracted physicians, physician assistants, nurse practitioners or other licensed health care professionals (the Practitioner), to provide me with telemedicine health care services (the Services") as deemed necessary by the treating Practitioner. I acknowledge and consent to receive the Services by the Practitioner via telemedicine. I understand that the telemedicine visit will involve communicating with the Practitioner through live audiovisual communication technology and the disclosure of certain medical information by electronic transmission. I acknowledge that I have been given the opportunity to request an in-person assessment or other available alternative prior to the telemedicine visit and am voluntarily participating in the telemedicine visit.  I understand that I have the right to withhold or withdraw my consent to the use of telemedicine in the course of my care at any time, without affecting my right to future care or treatment,  and that the Practitioner or I may terminate the telemedicine visit at any time. I understand that I have the right to inspect all information obtained and/or recorded in the course of the telemedicine visit and may receive copies of available information for a reasonable fee.  I understand that some of the potential risks of receiving the Services via telemedicine include:   Delay or interruption in medical evaluation due to technological equipment failure or disruption;  Information transmitted may not be sufficient (e.g. poor resolution of images) to allow for appropriate medical decision making by the Practitioner; and/or   In rare instances, security protocols could fail, causing a breach of personal health information.  Furthermore, I acknowledge that it is my responsibility to provide information about my medical history, conditions and care that is complete and accurate to the best of my ability. I acknowledge that Practitioner's advice, recommendations, and/or decision may be based on factors not within their control, such as incomplete or inaccurate data provided by me or distortions of diagnostic images or specimens that may result from electronic transmissions. I  understand that the practice of medicine is not an exact science and that Practitioner makes no warranties or guarantees regarding treatment outcomes. I acknowledge that I will receive a copy of this consent concurrently upon execution via email to the email address I last provided but may also request a printed copy by calling the office of [facility].    I understand that my insurance will be billed for this visit.   I have read or had this consent read to me.  I understand the contents of this consent, which adequately explains the benefits and risks of the Services being provided via telemedicine.   I have been provided ample opportunity to ask questions regarding this consent and the Services and have had my questions  answered to my satisfaction.  I give my informed consent for the services to be provided through the use of telemedicine in my medical care  By participating in this telemedicine visit I agree to the above.

## 2019-06-07 ENCOUNTER — Other Ambulatory Visit: Payer: Self-pay

## 2019-06-07 ENCOUNTER — Telehealth (INDEPENDENT_AMBULATORY_CARE_PROVIDER_SITE_OTHER): Payer: Medicare Other | Admitting: Cardiology

## 2019-06-07 ENCOUNTER — Encounter: Payer: Self-pay | Admitting: Cardiology

## 2019-06-07 VITALS — BP 140/86 | Ht 65.0 in | Wt 196.0 lb

## 2019-06-07 DIAGNOSIS — I25119 Atherosclerotic heart disease of native coronary artery with unspecified angina pectoris: Secondary | ICD-10-CM

## 2019-06-07 DIAGNOSIS — I10 Essential (primary) hypertension: Secondary | ICD-10-CM

## 2019-06-07 DIAGNOSIS — I714 Abdominal aortic aneurysm, without rupture, unspecified: Secondary | ICD-10-CM

## 2019-06-07 DIAGNOSIS — I739 Peripheral vascular disease, unspecified: Secondary | ICD-10-CM

## 2019-06-07 DIAGNOSIS — E782 Mixed hyperlipidemia: Secondary | ICD-10-CM

## 2019-06-07 MED ORDER — NITROGLYCERIN 0.4 MG SL SUBL: 0.4000 mg | SUBLINGUAL_TABLET | SUBLINGUAL | 3 refills | Status: DC | PRN

## 2019-06-07 NOTE — Patient Instructions (Addendum)

## 2019-06-12 ENCOUNTER — Other Ambulatory Visit: Payer: Self-pay | Admitting: Cardiology

## 2019-07-06 ENCOUNTER — Other Ambulatory Visit: Payer: Self-pay | Admitting: Cardiology

## 2019-07-30 ENCOUNTER — Other Ambulatory Visit: Payer: Self-pay | Admitting: Cardiology

## 2019-08-07 ENCOUNTER — Other Ambulatory Visit: Payer: Self-pay | Admitting: Cardiology

## 2019-09-30 ENCOUNTER — Other Ambulatory Visit: Payer: Self-pay | Admitting: Cardiology

## 2019-10-04 ENCOUNTER — Other Ambulatory Visit: Payer: Self-pay | Admitting: Cardiology

## 2019-12-08 ENCOUNTER — Other Ambulatory Visit: Payer: Self-pay | Admitting: Cardiology

## 2019-12-08 MED ORDER — SIMVASTATIN 40 MG PO TABS: 40.0000 mg | ORAL_TABLET | Freq: Every day | ORAL | 2 refills | Status: DC

## 2019-12-24 ENCOUNTER — Other Ambulatory Visit: Payer: Self-pay | Admitting: Cardiology

## 2020-01-04 ENCOUNTER — Other Ambulatory Visit: Payer: Self-pay | Admitting: Cardiology

## 2020-01-25 ENCOUNTER — Other Ambulatory Visit: Payer: Self-pay | Admitting: Cardiology

## 2020-01-30 ENCOUNTER — Other Ambulatory Visit: Payer: Self-pay

## 2020-01-30 ENCOUNTER — Encounter: Payer: Self-pay | Admitting: Cardiology

## 2020-01-30 ENCOUNTER — Ambulatory Visit (INDEPENDENT_AMBULATORY_CARE_PROVIDER_SITE_OTHER): Payer: Medicare Other | Admitting: Cardiology

## 2020-01-30 VITALS — BP 120/64 | HR 74 | Ht 65.0 in | Wt 211.4 lb

## 2020-01-30 DIAGNOSIS — I25119 Atherosclerotic heart disease of native coronary artery with unspecified angina pectoris: Secondary | ICD-10-CM

## 2020-01-30 DIAGNOSIS — E782 Mixed hyperlipidemia: Secondary | ICD-10-CM

## 2020-01-30 NOTE — Progress Notes (Signed)
Cardiology Office Note  Date: 01/30/2020   ID: Shaliah, Wann 1951-06-19, MRN [id number]  PCP:  Galen Manila, MD  Cardiologist:  Rozann Lesches, MD Electrophysiologist:  None   Chief Complaint  Patient presents with  . Cardiac follow-up    History of Present Illness: Heather Livingston is a 69 y.o. female last assessed via telehealth encounter in August 2020.  She presents for a routine visit.  No reported angina symptoms or nitroglycerin use, stable dyspnea on exertion.  I reviewed her cardiac regimen which is stable and outlined below.  She reports compliance and no obvious intolerances.  She did have interval lab work, LDL was well controlled at 48 back in October 2020.  I personally reviewed her ECG today which shows normal sinus rhythm with PVCs, diffuse ST-T wave abnormalities, possible old inferior infarct pattern..  She has had some leg discomfort as well, I encouraged follow-up with Dr. Trula Slade at [facility] in light of her history.  Past Medical History:  Diagnosis Date  . Abdominal aortic aneurysm (AAA) ([place])   . Arthritis    back  . Bradycardia   . Brain aneurysm   . Cataracts, bilateral   . Coronary atherosclerosis of native coronary artery    DES circ 10/10, residual 100% occluded SVG to OM, patent LIMA to LAD and SVG to PDA grafts, LVEF 50%  . GERD (gastroesophageal reflux disease)   . History of hiatal hernia   . History of stroke    Incidental findings by CT imaging  . Hyperlipemia   . Hypertension   . Low iron   . MI (myocardial infarction) ([place])   . Nonruptured cerebral aneurysm   . OSA (obstructive sleep apnea)    On CPAP  . Pneumonia   . Restless legs     Past Surgical History:  Procedure Laterality Date  . ABDOMINAL AORTIC ENDOVASCULAR STENT GRAFT N/A 05/05/2018   Procedure: ABDOMINAL AORTIC ENDOVASCULAR STENT GRAFT;  Surgeon: Serafina Mitchell, MD;  Location: [facility];  Service: Vascular;  Laterality: N/A;  . ABDOMINAL AORTOGRAM N/A 01/19/2018   Procedure: ABDOMINAL AORTOGRAM;  Surgeon: Jettie Booze, MD;  Location: [facility];  Service: Cardiovascular;  Laterality: N/A;  . ABDOMINAL HYSTERECTOMY    . CARPAL TUNNEL RELEASE    . CORONARY ARTERY BYPASS GRAFT  2009   [facility], LIMA to LAD, SVG to OM, SVG to PDA  . ENDOVASCULAR REPAIR/STENT GRAFT  05/05/2018  . IR ANGIO INTRA EXTRACRAN SEL INTERNAL CAROTID BILAT MOD SED  02/23/2018  . IR ANGIO VERTEBRAL SEL SUBCLAVIAN INNOMINATE UNI R MOD SED  04/14/2019  . IR ANGIO VERTEBRAL SEL VERTEBRAL BILAT MOD SED  02/23/2018  . IR ANGIO VERTEBRAL SEL VERTEBRAL UNI L MOD SED  03/18/2018  . IR ANGIOGRAM FOLLOW UP STUDY  03/18/2018  . IR ANGIOGRAM FOLLOW UP STUDY  03/18/2018  . IR ANGIOGRAM FOLLOW UP STUDY  03/18/2018  . IR ANGIOGRAM FOLLOW UP STUDY  03/18/2018  . IR TRANSCATH/EMBOLIZ  03/18/2018  . IR US GUIDE VASC ACCESS RIGHT  04/14/2019  . LEFT HEART CATH AND CORS/GRAFTS ANGIOGRAPHY N/A 01/19/2018   Procedure: LEFT HEART CATH AND CORS/GRAFTS ANGIOGRAPHY;  Surgeon: Jettie Booze, MD;  Location: [facility];  Service: Cardiovascular;  Laterality: N/A;  . LEFT HEART CATHETERIZATION WITH CORONARY/GRAFT ANGIOGRAM N/A 12/27/2012   Procedure: LEFT HEART CATHETERIZATION WITH Beatrix Fetters;  Surgeon: Hillary Bow, MD;  Location: [facility] CATH LAB;  Service: Cardiovascular;  Laterality:  N/A;  . RADIOLOGY WITH ANESTHESIA N/A 03/18/2018   Procedure: coil embolization of basilar aneurysm;  Surgeon: Lisbeth Renshaw, MD;  Location: [facility] OR;  Service: Radiology;  Laterality: N/A;    Current Outpatient Medications  Medication Sig Dispense Refill  . albuterol (VENTOLIN HFA) 108 (90 Base) MCG/ACT inhaler Inhale 1-2 puffs into the lungs every 6 (six) hours as needed for shortness of breath.     Marland Kitchen amLODipine (NORVASC) 5 MG tablet TAKE 1 TABLET BY MOUTH EVERY DAY 90 tablet 0  . aspirin EC 81 MG tablet Take 1 tablet (81 mg total) by mouth daily.    . clopidogrel (PLAVIX) 75 MG tablet TAKE 1  TABLET BY MOUTH EVERY DAY 90 tablet 2  . fluticasone (FLONASE) 50 MCG/ACT nasal spray Place 2 sprays into both nostrils at bedtime.    . hydrALAZINE (APRESOLINE) 100 MG tablet TAKE 1 TABLET BY MOUTH THREE TIMES A DAY 270 tablet 3  . hydrochlorothiazide (HYDRODIURIL) 12.5 MG tablet TAKE 2 TABLETS BY MOUTH EVERY DAY 180 tablet 2  . isosorbide mononitrate (IMDUR) 60 MG 24 hr tablet TAKE 1 TABLET BY MOUTH EVERY DAY 90 tablet 0  . losartan (COZAAR) 100 MG tablet TAKE 1 TABLET BY MOUTH EVERY DAY 90 tablet 1  . magnesium oxide (MAG-OX) 400 (241.3 Mg) MG tablet Take 400 mg by mouth daily.   3  . metoprolol tartrate (LOPRESSOR) 25 MG tablet TAKE 1 TABLET BY MOUTH TWICE A DAY 180 tablet 2  . montelukast (SINGULAIR) 10 MG tablet Take 10 mg by mouth daily.    . nitroGLYCERIN (NITROSTAT) 0.4 MG SL tablet Place 1 tablet (0.4 mg total) under the tongue every 5 (five) minutes x 3 doses as needed for chest pain (if no relief after 3rd dose, proceed to the ED for an evaluation or call 911). 25 tablet 3  . simvastatin (ZOCOR) 40 MG tablet Take 1 tablet (40 mg total) by mouth at bedtime. 90 tablet 2  . spironolactone (ALDACTONE) 25 MG tablet TAKE 1/2 TABLET BY MOUTH EVERY DAY 45 tablet 1   No current facility-administered medications for this visit.   Allergies:  Patient has no known allergies.   ROS:  No palpitations or syncope.  Physical Exam: VS:  BP 120/64   Pulse 74   Ht 5\' 5"  (1.651 m)   Wt 211 lb 6.4 oz (95.9 kg)   SpO2 95%   BMI 35.18 kg/m , BMI Body mass index is 35.18 kg/m.  Wt Readings from Last 3 Encounters:  01/30/20 211 lb 6.4 oz (95.9 kg)  06/07/19 196 lb (88.9 kg)  04/14/19 206 lb (93.4 kg)    General: Patient appears comfortable at rest. HEENT: Conjunctiva and lids normal, oropharynx clear with moist mucosa. Neck: Supple, no elevated JVP or carotid bruits, no thyromegaly. Lungs: Clear to auscultation, nonlabored breathing at rest. Cardiac: Regular rate and rhythm, no S3 or  significant systolic murmur. Abdomen: Soft, bowel sounds present. Extremities: No pitting edema, distal pulses 2+.  ECG:  An ECG dated 01/23/2018 was personally reviewed today and demonstrated:  Sinus rhythm with PAC, old inferior infarct pattern, nonspecific T wave changes.  Recent Labwork:  October 2020: Cholesterol 90, triglycerides 71, HDL 28, LDL 48, AST 17, ALT 19  Other Studies Reviewed Today:  Cardiac catheterization 01/19/2018:  Prox RCA lesion is 100% stenosed. SVG to PDA is patent.  Prox LAD lesion is 100% stenosed. LIMA to LAD is patent.  Mid LM to Dist LM lesion is 25% stenosed.  Mid  Cx lesion is 50% stenosed. SVG to OM is occluded, known from prior.  The left ventricular systolic function is normal.  LV end diastolic pressure is mildly elevated.  The left ventricular ejection fraction is 50-55% by visual estimate.  There is no aortic valve stenosis.  Infrarenal AAA. PAD including right common iliac artery stenosis.  Bilateral common iliac arteries appear ectatic.  Medical therapy for CAD. Stable from prior cath. No acute lesion detected.  Assessment and Plan:  1.  Multivessel CAD status post CABG and DES to the circumflex.  No progressive angina at this time on multimodal medical therapy, continue with current regimen and observation.  She is on aspirin, Plavix, Norvasc, Imdur, Cozaar, Lopressor, and Zocor.  2.  Mixed hyperlipidemia on Zocor, last LDL 48.  3.  Abdominal aortic aneurysm status post endovascular repair with concurrent management of right common iliac stenosis.  She does complain of interval leg pain, I asked her to make sure she has follow-up with Dr. Myra Gianotti.  Continue antiplatelet regimen and statin.  Medication Adjustments/Labs and Tests Ordered: Current medicines are reviewed at length with the patient today.  Concerns regarding medicines are outlined above.   Tests Ordered: Orders Placed This Encounter  Procedures  . EKG 12-Lead     Medication Changes: No orders of the defined types were placed in this encounter.   Disposition:  Follow up 6 months in the [place] office.  Signed, Jonelle Sidle, MD, [facility] 01/30/2020 4:30 PM    [facility] [address] [place], [place], [place] [address] Phone: [phone]; Fax: [phone]

## 2020-01-30 NOTE — Patient Instructions (Addendum)

## 2020-02-03 ENCOUNTER — Other Ambulatory Visit: Payer: Self-pay | Admitting: Cardiology

## 2020-03-14 ENCOUNTER — Other Ambulatory Visit: Payer: Self-pay | Admitting: *Deleted

## 2020-03-14 DIAGNOSIS — I6523 Occlusion and stenosis of bilateral carotid arteries: Secondary | ICD-10-CM

## 2020-03-14 DIAGNOSIS — I714 Abdominal aortic aneurysm, without rupture, unspecified: Secondary | ICD-10-CM

## 2020-03-19 ENCOUNTER — Other Ambulatory Visit (HOSPITAL_COMMUNITY): Payer: Medicare Other

## 2020-03-19 ENCOUNTER — Encounter (HOSPITAL_COMMUNITY): Payer: Medicare Other

## 2020-03-19 ENCOUNTER — Ambulatory Visit: Payer: Medicare Other

## 2020-04-09 ENCOUNTER — Other Ambulatory Visit: Payer: Self-pay | Admitting: *Deleted

## 2020-04-09 DIAGNOSIS — I6523 Occlusion and stenosis of bilateral carotid arteries: Secondary | ICD-10-CM

## 2020-04-09 DIAGNOSIS — Z95828 Presence of other vascular implants and grafts: Secondary | ICD-10-CM

## 2020-04-09 DIAGNOSIS — I739 Peripheral vascular disease, unspecified: Secondary | ICD-10-CM

## 2020-04-19 ENCOUNTER — Ambulatory Visit (HOSPITAL_COMMUNITY)
Admission: RE | Admit: 2020-04-19 | Discharge: 2020-04-19 | Disposition: A | Discharge status: Home / Self Care | Payer: Medicare Other | Source: Ambulatory Visit | Attending: Surgery | Admitting: Surgery

## 2020-04-19 ENCOUNTER — Ambulatory Visit (INDEPENDENT_AMBULATORY_CARE_PROVIDER_SITE_OTHER)
Admission: RE | Admit: 2020-04-19 | Discharge: 2020-04-19 | Disposition: A | Discharge status: Home / Self Care | Payer: Medicare Other | Source: Ambulatory Visit | Attending: Surgery | Admitting: Surgery

## 2020-04-19 ENCOUNTER — Other Ambulatory Visit: Payer: Self-pay

## 2020-04-19 ENCOUNTER — Ambulatory Visit (INDEPENDENT_AMBULATORY_CARE_PROVIDER_SITE_OTHER): Payer: Medicare Other | Admitting: Physician Assistant

## 2020-04-19 VITALS — BP 165/84 | HR 60 | Temp 98.2°F | Resp 20 | Ht 65.0 in | Wt 210.0 lb

## 2020-04-19 DIAGNOSIS — M5136 Other intervertebral disc degeneration, lumbar region: Secondary | ICD-10-CM

## 2020-04-19 DIAGNOSIS — I6523 Occlusion and stenosis of bilateral carotid arteries: Secondary | ICD-10-CM

## 2020-04-19 DIAGNOSIS — I714 Abdominal aortic aneurysm, without rupture, unspecified: Secondary | ICD-10-CM

## 2020-04-19 DIAGNOSIS — I739 Peripheral vascular disease, unspecified: Secondary | ICD-10-CM | POA: Insufficient documentation

## 2020-04-19 NOTE — Progress Notes (Signed)
History of Present Illness:  Patient is a 69 y.o. year old female who presents for evaluation of abdominal aortic aneurysm and right iliac stenosis.   She is s/p Endovascular repair of abdominal aortic aneurysm.   Her work-up revealed a high-grade proximal right common iliac artery stenosis and a 3.6 cm infrarenal abdominal aortic aneurysm as well as a 4 cm descending thoracic aortic aneurysm.  I discussed with her that isolated treatment of the right common iliac stenosis would make future stent grafting very challenging from a endovascular approach, and therefore we elected to simultaneously treat her small abdominal aortic aneurysm along with her right common iliac stenosis.  She has had right > left LE pain that starts in the lumbar area and radiates to the anterior thigh and down her leg.  She has to stop and rest after 100 feet.  She states she has a history of lumbar disc herniation.    She is being followed for asymptomatic carotid stenosis as well.  She denise amaurosis, weakness in extremities and aphasia.  She takes ASA, Plavix and a Statin daily.    Past Medical History:  Diagnosis Date  . Abdominal aortic aneurysm (AAA) (HCC)   . Arthritis    back  . Bradycardia   . Brain aneurysm   . Cataracts, bilateral   . Coronary atherosclerosis of native coronary artery    DES circ 10/10, residual 100% occluded SVG to OM, patent LIMA to LAD and SVG to PDA grafts, LVEF 50%  . GERD (gastroesophageal reflux disease)   . History of hiatal hernia   . History of stroke    Incidental findings by CT imaging  . Hyperlipemia   . Hypertension   . Low iron   . MI (myocardial infarction) (HCC)   . Nonruptured cerebral aneurysm   . OSA (obstructive sleep apnea)    On CPAP  . Pneumonia   . Restless legs     Past Surgical History:  Procedure Laterality Date  . ABDOMINAL AORTIC ENDOVASCULAR STENT GRAFT N/A 05/05/2018   Procedure: ABDOMINAL AORTIC ENDOVASCULAR STENT GRAFT;  Surgeon: Nada Libman, MD;  Location: [facility] OR;  Service: Vascular;  Laterality: N/A;  . ABDOMINAL AORTOGRAM N/A 01/19/2018   Procedure: ABDOMINAL AORTOGRAM;  Surgeon: Corky Crafts, MD;  Location: [facility] INVASIVE CV LAB;  Service: Cardiovascular;  Laterality: N/A;  . ABDOMINAL HYSTERECTOMY    . CARPAL TUNNEL RELEASE    . CORONARY ARTERY BYPASS GRAFT  2009   [facility], LIMA to LAD, SVG to OM, SVG to PDA  . ENDOVASCULAR REPAIR/STENT GRAFT  05/05/2018  . IR ANGIO INTRA EXTRACRAN SEL INTERNAL CAROTID BILAT MOD SED  02/23/2018  . IR ANGIO VERTEBRAL SEL SUBCLAVIAN INNOMINATE UNI R MOD SED  04/14/2019  . IR ANGIO VERTEBRAL SEL VERTEBRAL BILAT MOD SED  02/23/2018  . IR ANGIO VERTEBRAL SEL VERTEBRAL UNI L MOD SED  03/18/2018  . IR ANGIOGRAM FOLLOW UP STUDY  03/18/2018  . IR ANGIOGRAM FOLLOW UP STUDY  03/18/2018  . IR ANGIOGRAM FOLLOW UP STUDY  03/18/2018  . IR ANGIOGRAM FOLLOW UP STUDY  03/18/2018  . IR TRANSCATH/EMBOLIZ  03/18/2018  . IR US GUIDE VASC ACCESS RIGHT  04/14/2019  . LEFT HEART CATH AND CORS/GRAFTS ANGIOGRAPHY N/A 01/19/2018   Procedure: LEFT HEART CATH AND CORS/GRAFTS ANGIOGRAPHY;  Surgeon: Corky Crafts, MD;  Location: [facility] INVASIVE CV LAB;  Service: Cardiovascular;  Laterality: N/A;  . LEFT HEART CATHETERIZATION WITH CORONARY/GRAFT ANGIOGRAM N/A 12/27/2012  Procedure: LEFT HEART CATHETERIZATION WITH Isabel Caprice;  Surgeon: Herby Abraham, MD;  Location: [facility] CATH LAB;  Service: Cardiovascular;  Laterality: N/A;  . RADIOLOGY WITH ANESTHESIA N/A 03/18/2018   Procedure: coil embolization of basilar aneurysm;  Surgeon: Lisbeth Renshaw, MD;  Location: [facility] OR;  Service: Radiology;  Laterality: N/A;     Social History Social History   Tobacco Use  . Smoking status: Former Smoker    Packs/day: 0.75    Years: 43.00    Pack years: 32.25    Types: Cigarettes    Start date: 09/13/1965    Quit date: 10/14/2007    Years since quitting: 12.5  . Smokeless tobacco: Never Used  Vaping Use  . Vaping Use:  Never used  Substance Use Topics  . Alcohol use: Yes    Alcohol/week: 0.0 standard drinks    Comment: Occasional wine or beer  . Drug use: No    Family History Family History  Problem Relation Age of Onset  . Hypertension Mother   . Heart attack Father   . Hypertension Brother     Allergies  No Known Allergies   Current Outpatient Medications  Medication Sig Dispense Refill  . albuterol (VENTOLIN HFA) 108 (90 Base) MCG/ACT inhaler Inhale 1-2 puffs into the lungs every 6 (six) hours as needed for shortness of breath.     Marland Kitchen amLODipine (NORVASC) 5 MG tablet TAKE 1 TABLET BY MOUTH EVERY DAY 90 tablet 0  . aspirin EC 81 MG tablet Take 1 tablet (81 mg total) by mouth daily.    . clopidogrel (PLAVIX) 75 MG tablet TAKE 1 TABLET BY MOUTH EVERY DAY 90 tablet 2  . famotidine (PEPCID) 40 MG tablet Take 1 tablet by mouth at bedtime.    . fluticasone (FLONASE) 50 MCG/ACT nasal spray Place 2 sprays into both nostrils at bedtime.    . hydrALAZINE (APRESOLINE) 100 MG tablet TAKE 1 TABLET BY MOUTH THREE TIMES A DAY 270 tablet 3  . hydrochlorothiazide (HYDRODIURIL) 12.5 MG tablet TAKE 2 TABLETS BY MOUTH EVERY DAY 180 tablet 2  . isosorbide mononitrate (IMDUR) 60 MG 24 hr tablet TAKE 1 TABLET BY MOUTH EVERY DAY 90 tablet 0  . losartan (COZAAR) 100 MG tablet TAKE 1 TABLET BY MOUTH EVERY DAY 90 tablet 1  . magnesium oxide (MAG-OX) 400 (241.3 Mg) MG tablet Take 400 mg by mouth daily.   3  . metoprolol tartrate (LOPRESSOR) 25 MG tablet TAKE 1 TABLET BY MOUTH TWICE A DAY 180 tablet 2  . montelukast (SINGULAIR) 10 MG tablet Take 10 mg by mouth daily.    . nitroGLYCERIN (NITROSTAT) 0.4 MG SL tablet Place 1 tablet (0.4 mg total) under the tongue every 5 (five) minutes x 3 doses as needed for chest pain (if no relief after 3rd dose, proceed to the ED for an evaluation or call 911). 25 tablet 3  . simvastatin (ZOCOR) 40 MG tablet Take 1 tablet (40 mg total) by mouth at bedtime. 90 tablet 2  . spironolactone  (ALDACTONE) 25 MG tablet TAKE 1/2 TABLET BY MOUTH EVERY DAY 45 tablet 1   No current facility-administered medications for this visit.    ROS:   General:  No weight loss, Fever, chills  HEENT: No recent headaches, no nasal bleeding, no visual changes, no sore throat  Neurologic: No dizziness, blackouts, seizures. No recent symptoms of stroke or mini- stroke. No recent episodes of slurred speech, or temporary blindness.  Cardiac: No recent episodes of chest pain/pressure, no shortness  of breath at rest.  No shortness of breath with exertion.  Denies history of atrial fibrillation or irregular heartbeat  Vascular: No history of rest pain in feet.  No history of claudication.  No history of non-healing ulcer, No history of DVT   Pulmonary: No home oxygen, no productive cough, no hemoptysis,  No asthma or wheezing  Musculoskeletal:  [ ]  Arthritis, x[ ]  Low back pain,  [ ]  Joint pain  Hematologic:No history of hypercoagulable state.  No history of easy bleeding.  No history of anemia  Gastrointestinal: No hematochezia or melena,  No gastroesophageal reflux, no trouble swallowing  Urinary: [ ]  chronic Kidney disease, [ ]  on HD - [ ]  MWF or [ ]  TTHS, [ ]  Burning with urination, [ ]  Frequent urination, [ ]  Difficulty urinating;   Skin: No rashes  Psychological: No history of anxiety,  No history of depression   Physical Examination  Vitals:   04/19/20 0931 04/19/20 0936  BP: (!) 153/78 (!) 165/84  Pulse: 60   Resp: 20   Temp: 98.2 F (36.8 C)   TempSrc: Temporal   SpO2: 95%   Weight: 210 lb (95.3 kg)   Height: 5\' 5"  (1.651 m)     Body mass index is 34.95 kg/m.  General:  Alert and oriented, no acute distress HEENT: Normal Neck: No bruit or JVD Pulmonary: Clear to auscultation bilaterally Cardiac: Regular Rate and Rhythm without murmur Gastrointestinal: Soft, non-tender, non-distended, no mass, no scars Skin: No rash Extremity Pulses:  2+ radial, brachial, femoral,  dorsalis pedis, posterior tibial pulses bilaterally Musculoskeletal: No deformity or edema  Neurologic: Upper and lower extremity motor 5/5 and symmetric  DATA:    Abdominal Aorta Findings:  +----------+-------+----------+----------+--------+--------+--------+  Location AP (cm)Trans (cm)PSV (cm/s)WaveformThrombusComments  +----------+-------+----------+----------+--------+--------+--------+  LT CIA Mid         424                  +----------+-------+----------+----------+--------+--------+--------+     Endovascular Aortic Repair (EVAR):  +----------+----------------+-------------------+-------------------+       Diameter AP (cm)Diameter Trans (cm)Velocities (cm/sec)  +----------+----------------+-------------------+-------------------+  Aorta   2.70      2.94        47           +----------+----------------+-------------------+-------------------+  Right Limb1.61      1.49        117          +----------+----------------+-------------------+-------------------+  Left Limb 1.86                70           +----------+----------------+-------------------+-------------------+         Summary:  Abdominal Aorta: Patent EVAR with no evidence of endoleak. >50% stenosis in the mid CIA outflow.      ASSESSMENT/PLAN: AAA, right iliac aneurysm s/p EVAR No endoleak present on AAA duplex today.  > 50% mid ICA left noted on duplex.  ABI with triphasic flow and good toe pressure.  She has palpable pedal pulses.    Carotid duplex < 39% stenosis B ICA.  She remains asymptomatic of stroke and TIA.    Her right LE symptoms of radiating pain and history of herniated lumbar discs lead me to DDD and spinal stenosis.  I will refer her to Dr. for evaluation and possible treatment.  I do not think her symptoms are related to arterial blood flow.    F/U in 1  year for repeat EVAR duplex and 2 years for repeat  carotid duplex.      Heather PigeonEmma Maureen Aliviana Burdell PA-C [facility] Office: [phone]  MD in clinic [facility]

## 2020-04-26 ENCOUNTER — Ambulatory Visit (INDEPENDENT_AMBULATORY_CARE_PROVIDER_SITE_OTHER): Payer: Medicare Other | Admitting: Orthopaedic Surgery

## 2020-04-26 ENCOUNTER — Other Ambulatory Visit: Payer: Self-pay

## 2020-04-26 ENCOUNTER — Ambulatory Visit (INDEPENDENT_AMBULATORY_CARE_PROVIDER_SITE_OTHER): Payer: Medicare Other

## 2020-04-26 ENCOUNTER — Other Ambulatory Visit: Payer: Self-pay | Admitting: Cardiology

## 2020-04-26 VITALS — BP 129/64 | HR 78 | Ht 65.5 in | Wt 193.0 lb

## 2020-04-26 DIAGNOSIS — M5442 Lumbago with sciatica, left side: Secondary | ICD-10-CM | POA: Diagnosis not present

## 2020-04-26 DIAGNOSIS — I6523 Occlusion and stenosis of bilateral carotid arteries: Secondary | ICD-10-CM | POA: Diagnosis not present

## 2020-04-26 DIAGNOSIS — M5441 Lumbago with sciatica, right side: Secondary | ICD-10-CM

## 2020-04-26 DIAGNOSIS — G8929 Other chronic pain: Secondary | ICD-10-CM

## 2020-04-26 NOTE — Progress Notes (Signed)
Office Visit Note   Patient: Heather Livingston           Date of Birth: [birth year]           MRN: [id number] Visit Date: 04/26/2020              Requested by: Nada Libman, MD [address] [place],  [place] [address] PCP: Theodoro Kos, MD   Assessment & Plan: Visit Diagnoses:  1. Chronic bilateral low back pain with bilateral sciatica     Plan: We will set patient up for some physical therapy she has significant core weakness and bilateral lower extremity weakness which may relate to the history of vascular problems with less activity.  We will check her back again in 5 weeks.  Hopefully should get improvement in her symptoms with some strengthening.  Follow-Up Instructions: No follow-ups on file.   Orders:  Orders Placed This Encounter  Procedures  . XR Lumbar Spine 2-3 Views   No orders of the defined types were placed in this encounter.     Procedures: No procedures performed   Clinical Data: No additional findings.   Subjective: Chief Complaint  Patient presents with  . Lower Back - Pain    HPI 69 year old female seen with hip back and leg pain which has been present since 2019.  She states she has had previous surgery with some relief but continues to have radicular pain right equals left.  She has some numbness and tingling occasionally at times she has severe pain with weightbearing she states the pain wakes her up at night and states it is constant occasionally she has groin pain she has a lot of thigh pain more pain in her buttocks.  Patient had some coiling for basilar brain aneurysm.  She is also had abdominal aortic endovascular stent grafts.  Cardiac catheterization 2014.  Review of Systems positive for a sleep apnea hypertension.  AAA with stenting.  Previous cardiac stent.  Open heart surgery 2009.  Patient did smoke for 30 years now non-smoker.  Patient is on baby aspirin.  Otherwise 14 point review of systems noncontributory.   Objective: Vital  Signs: BP 129/64   Pulse 78   Ht 5' 5.5" (1.664 m)   Wt 193 lb (87.5 kg)   BMI 31.63 kg/m   Physical Exam Constitutional:      Appearance: She is well-developed.  HENT:     Head: Normocephalic.     Right Ear: External ear normal.     Left Ear: External ear normal.  Eyes:     Pupils: Pupils are equal, round, and reactive to light.  Neck:     Thyroid: No thyromegaly.     Trachea: No tracheal deviation.  Cardiovascular:     Rate and Rhythm: Normal rate.  Pulmonary:     Effort: Pulmonary effort is normal.  Abdominal:     Palpations: Abdomen is soft.  Skin:    General: Skin is warm and dry.  Neurological:     Mental Status: She is alert and oriented to person, place, and time.  Psychiatric:        Behavior: Behavior normal.     Ortho Exam patient has negative logroll the hips knee and ankle jerk are intact.  Negative straight leg raising she does have some slight tenderness right and left.  Anterior tib gastrocsoleus is intact.    Specialty Comments:  No specialty comments available.  Imaging: No results found.   PMFS History: Patient  Active Problem List   Diagnosis Date Noted  . AAA (abdominal aortic aneurysm) (HCC) 05/05/2018  . Cerebral aneurysm, nonruptured 03/18/2018  . Chest pain 01/23/2018  . GERD (gastroesophageal reflux disease) 01/23/2018  . OSA (obstructive sleep apnea) 01/23/2018  . Aneurysm of infrarenal abdominal aorta (HCC) 01/21/2018  . 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 Unstable angina (HCC) 01/19/2018  . Essential hypertension, benign 10/29/2010  . CORONARY ATHEROSCLEROSIS NATIVE CORONARY ARTERY 10/29/2010  . SUPRAVENTRICULAR TACHYCARDIA 09/13/2010  . Hyperlipemia 08/27/2010  . 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 Palpitations 08/27/2010   Past Medical History:  Diagnosis Date  . Abdominal aortic aneurysm (AAA) (HCC)   . Arthritis    back  . Bradycardia   . Brain aneurysm   . Cataracts, bilateral   . Coronary atherosclerosis of  native coronary artery    DES circ 10/10, residual 100% occluded SVG to OM, patent LIMA to LAD and SVG to PDA grafts, LVEF 50%  . GERD (gastroesophageal reflux disease)   . History of hiatal hernia   . History of stroke    Incidental findings by CT imaging  . Hyperlipemia   . Hypertension   . Low iron   . MI (myocardial infarction) (HCC)   . Nonruptured cerebral aneurysm   . OSA (obstructive sleep apnea)    On CPAP  . Pneumonia   . Restless legs     Family History  Problem Relation Age of Onset  . Hypertension Mother   . Heart attack Father   . Hypertension Brother     Past Surgical History:  Procedure Laterality Date  . ABDOMINAL AORTIC ENDOVASCULAR STENT GRAFT N/A 05/05/2018   Procedure: ABDOMINAL AORTIC ENDOVASCULAR STENT GRAFT;  Surgeon: Nada Libman, MD;  Location: [facility] OR;  Service: Vascular;  Laterality: N/A;  . ABDOMINAL AORTOGRAM N/A 01/19/2018   Procedure: ABDOMINAL AORTOGRAM;  Surgeon: Corky Crafts, MD;  Location: [facility] INVASIVE CV LAB;  Service: Cardiovascular;  Laterality: N/A;  . ABDOMINAL HYSTERECTOMY    . CARPAL TUNNEL RELEASE    . CORONARY ARTERY BYPASS GRAFT  2009   [facility], LIMA to LAD, SVG to OM, SVG to PDA  . ENDOVASCULAR REPAIR/STENT GRAFT  05/05/2018  . IR ANGIO INTRA EXTRACRAN SEL INTERNAL CAROTID BILAT MOD SED  02/23/2018  . IR ANGIO VERTEBRAL SEL SUBCLAVIAN INNOMINATE UNI R MOD SED  04/14/2019  . IR ANGIO VERTEBRAL SEL VERTEBRAL BILAT MOD SED  02/23/2018  . IR ANGIO VERTEBRAL SEL VERTEBRAL UNI L MOD SED  03/18/2018  . IR ANGIOGRAM FOLLOW UP STUDY  03/18/2018  . IR ANGIOGRAM FOLLOW UP STUDY  03/18/2018  . IR ANGIOGRAM FOLLOW UP STUDY  03/18/2018  . IR ANGIOGRAM FOLLOW UP STUDY  03/18/2018  . IR TRANSCATH/EMBOLIZ  03/18/2018  . IR US GUIDE VASC ACCESS RIGHT  04/14/2019  . LEFT HEART CATH AND CORS/GRAFTS ANGIOGRAPHY N/A 01/19/2018   Procedure: LEFT HEART CATH AND CORS/GRAFTS ANGIOGRAPHY;  Surgeon: Corky Crafts, MD;  Location: [facility] INVASIVE CV LAB;  Service:  Cardiovascular;  Laterality: N/A;  . LEFT HEART CATHETERIZATION WITH CORONARY/GRAFT ANGIOGRAM N/A 12/27/2012   Procedure: LEFT HEART CATHETERIZATION WITH Isabel Caprice;  Surgeon: Herby Abraham, MD;  Location: [facility] CATH LAB;  Service: Cardiovascular;  Laterality: N/A;  . RADIOLOGY WITH ANESTHESIA N/A 03/18/2018   Procedure: coil embolization of basilar aneurysm;  Surgeon: Lisbeth Renshaw, MD;  Location: [facility] OR;  Service: Radiology;  Laterality: N/A;   Social History   Occupational History  . Occupation: Disabled  Tobacco  Use  . Smoking status: Former Smoker    Packs/day: 0.75    Years: 43.00    Pack years: 32.25    Types: Cigarettes    Start date: 09/13/1965    Quit date: 10/14/2007    Years since quitting: 12.5  . Smokeless tobacco: Never Used  Vaping Use  . Vaping Use: Never used  Substance and Sexual Activity  . Alcohol use: Yes    Alcohol/week: 0.0 standard drinks    Comment: Occasional wine or beer  . Drug use: No  . Sexual activity: Not on file

## 2020-06-07 ENCOUNTER — Ambulatory Visit: Payer: Medicare Other | Admitting: Orthopaedic Surgery

## 2020-06-07 ENCOUNTER — Other Ambulatory Visit: Payer: Self-pay

## 2020-06-14 ENCOUNTER — Encounter: Payer: Self-pay | Admitting: Orthopaedic Surgery

## 2020-06-14 ENCOUNTER — Ambulatory Visit (INDEPENDENT_AMBULATORY_CARE_PROVIDER_SITE_OTHER): Payer: Medicare Other | Admitting: Orthopaedic Surgery

## 2020-06-14 DIAGNOSIS — I739 Peripheral vascular disease, unspecified: Secondary | ICD-10-CM | POA: Diagnosis not present

## 2020-06-14 DIAGNOSIS — I6523 Occlusion and stenosis of bilateral carotid arteries: Secondary | ICD-10-CM | POA: Diagnosis not present

## 2020-06-14 NOTE — Progress Notes (Signed)
Office Visit Note   Patient: Heather Livingston           Date of Birth: January 07, 1951           MRN: [id number] Visit Date: 06/14/2020              Requested by: Theodoro Kos, MD [address] [place],  [place] [address] PCP: Theodoro Kos, MD   Assessment & Plan: Visit Diagnoses:  1. Claudication [facility])     Plan: Her arterial Dopplers did not demonstrate that this is arterial claudication.  We will obtain a lumbar myelogram CT scan since she states her pain is severe limits her community ambulation.  Office follow-up after lumbar myelogram CT scan.  Follow-Up Instructions: Office follow-up after lumbar myelogram CT scan.  Orders:  No orders of the defined types were placed in this encounter.  No orders of the defined types were placed in this encounter.     Procedures: No procedures performed   Clinical Data: No additional findings.   Subjective: Chief Complaint  Patient presents with  . Lower Back - Follow-up, Pain    HPI 69 year old female with ongoing claudication symptoms that occur when she stands or when she walks.  She states she can make it about a half block.  She had endovascular stents placed due to PAD and this was 2 years ago when she had the onset of claudication symptoms.  Post stenting she has not noticed any improvement.  ABIs done 04/19/2020 shows 1.03 right and 0.98 left.  She did not have evidence of significant arterial stenosis.  She states she has some numbness in her anterior thigh on the right.  She has severe low back pain that radiates into her buttocks and her legs occasionally down her right leg to her ankle but mostly buttocks and thigh region.  She has to lean on a grocery cart.  She has been through therapy for over a month with persistent symptoms.  She rates her pain when it hits it 10 out of 10.  Sometimes she has the pain when she is in a sitting position.  She does not have the classic rest and repeat symptoms as expected with  neurogenic claudications.  She not able to walk up a hill or steps.  Review of Systems 14 point review of systems Unchanged from 04/26/2020 note.  30-pack-year history smoking now non-smoker.  Previous cardiac stents cardiac bypass surgery endovascular stenting.  Sleep apnea hypertension of note.  No history of lumbar procedures.  Patient has cerebral aneurysm stent and is not able to have an MRI.  Objective: Vital Signs: BP (!) 165/81   Ht 5' 5.5" (1.664 m)   Wt 193 lb (87.5 kg)   BMI 31.63 kg/m   Physical Exam Constitutional:      Appearance: She is well-developed.  HENT:     Head: Normocephalic.     Right Ear: External ear normal.     Left Ear: External ear normal.  Eyes:     Pupils: Pupils are equal, round, and reactive to light.  Neck:     Thyroid: No thyromegaly.     Trachea: No tracheal deviation.  Cardiovascular:     Rate and Rhythm: Normal rate.  Pulmonary:     Effort: Pulmonary effort is normal.  Abdominal:     Palpations: Abdomen is soft.  Skin:    General: Skin is warm and dry.  Neurological:     Mental Status: She is alert and  oriented to person, place, and time.  Psychiatric:        Behavior: Behavior normal.     Ortho Exam negative logroll the hips.  Reflexes are intact negative straight leg raising 90 degrees.  She has tenderness over the sciatic notch right and left tenderness over the trochanters right and left.  Tenderness lumbosacral junction.  Specialty Comments:  No specialty comments available.  Imaging: No results found.   PMFS History: Patient Active Problem List   Diagnosis Date Noted  . Claudication (HCC) 06/14/2020  . AAA (abdominal aortic aneurysm) (HCC) 05/05/2018  . Cerebral aneurysm, nonruptured 03/18/2018  . Chest pain 01/23/2018  . GERD (gastroesophageal reflux disease) 01/23/2018  . OSA (obstructive sleep apnea) 01/23/2018  . Aneurysm of infrarenal abdominal aorta (HCC) 01/21/2018  . Basilar artery aneurysm (HCC) 01/21/2018    . Unstable angina (HCC) 01/19/2018  . Essential hypertension, benign 10/29/2010  . CORONARY ATHEROSCLEROSIS NATIVE CORONARY ARTERY 10/29/2010  . SUPRAVENTRICULAR TACHYCARDIA 09/13/2010  . Hyperlipemia 08/27/2010  . 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 Palpitations 08/27/2010   Past Medical History:  Diagnosis Date  . Abdominal aortic aneurysm (AAA) (HCC)   . Arthritis    back  . Bradycardia   . Brain aneurysm   . Cataracts, bilateral   . Coronary atherosclerosis of native coronary artery    DES circ 10/10, residual 100% occluded SVG to OM, patent LIMA to LAD and SVG to PDA grafts, LVEF 50%  . GERD (gastroesophageal reflux disease)   . History of hiatal hernia   . History of stroke    Incidental findings by CT imaging  . Hyperlipemia   . Hypertension   . Low iron   . MI (myocardial infarction) (HCC)   . Nonruptured cerebral aneurysm   . OSA (obstructive sleep apnea)    On CPAP  . Pneumonia   . Restless legs     Family History  Problem Relation Age of Onset  . Hypertension Mother   . Heart attack Father   . Hypertension Brother     Past Surgical History:  Procedure Laterality Date  . ABDOMINAL AORTIC ENDOVASCULAR STENT GRAFT N/A 05/05/2018   Procedure: ABDOMINAL AORTIC ENDOVASCULAR STENT GRAFT;  Surgeon: Nada Libman, MD;  Location: [facility] OR;  Service: Vascular;  Laterality: N/A;  . ABDOMINAL AORTOGRAM N/A 01/19/2018   Procedure: ABDOMINAL AORTOGRAM;  Surgeon: Corky Crafts, MD;  Location: [facility] INVASIVE CV LAB;  Service: Cardiovascular;  Laterality: N/A;  . ABDOMINAL HYSTERECTOMY    . CARPAL TUNNEL RELEASE    . CORONARY ARTERY BYPASS GRAFT  2009   [facility], LIMA to LAD, SVG to OM, SVG to PDA  . ENDOVASCULAR REPAIR/STENT GRAFT  05/05/2018  . IR ANGIO INTRA EXTRACRAN SEL INTERNAL CAROTID BILAT MOD SED  02/23/2018  . IR ANGIO VERTEBRAL SEL SUBCLAVIAN INNOMINATE UNI R MOD SED  04/14/2019  . IR ANGIO VERTEBRAL SEL VERTEBRAL BILAT MOD SED  02/23/2018  . IR ANGIO VERTEBRAL  SEL VERTEBRAL UNI L MOD SED  03/18/2018  . IR ANGIOGRAM FOLLOW UP STUDY  03/18/2018  . IR ANGIOGRAM FOLLOW UP STUDY  03/18/2018  . IR ANGIOGRAM FOLLOW UP STUDY  03/18/2018  . IR ANGIOGRAM FOLLOW UP STUDY  03/18/2018  . IR TRANSCATH/EMBOLIZ  03/18/2018  . IR US GUIDE VASC ACCESS RIGHT  04/14/2019  . LEFT HEART CATH AND CORS/GRAFTS ANGIOGRAPHY N/A 01/19/2018   Procedure: LEFT HEART CATH AND CORS/GRAFTS ANGIOGRAPHY;  Surgeon: Corky Crafts, MD;  Location: [facility] INVASIVE CV LAB;  Service:  Cardiovascular;  Laterality: N/A;  . LEFT HEART CATHETERIZATION WITH CORONARY/GRAFT ANGIOGRAM N/A 12/27/2012   Procedure: LEFT HEART CATHETERIZATION WITH Isabel Caprice;  Surgeon: Herby Abraham, MD;  Location: [facility] CATH LAB;  Service: Cardiovascular;  Laterality: N/A;  . RADIOLOGY WITH ANESTHESIA N/A 03/18/2018   Procedure: coil embolization of basilar aneurysm;  Surgeon: Lisbeth Renshaw, MD;  Location: [facility] OR;  Service: Radiology;  Laterality: N/A;   Social History   Occupational History  . Occupation: Disabled  Tobacco Use  . Smoking status: Former Smoker    Packs/day: 0.75    Years: 43.00    Pack years: 32.25    Types: Cigarettes    Start date: 09/13/1965    Quit date: 10/14/2007    Years since quitting: 12.6  . Smokeless tobacco: Never Used  Vaping Use  . Vaping Use: Never used  Substance and Sexual Activity  . Alcohol use: Yes    Alcohol/week: 0.0 standard drinks    Comment: Occasional wine or beer  . Drug use: No  . Sexual activity: Not on file

## 2020-06-15 ENCOUNTER — Telehealth: Payer: Self-pay | Admitting: Nurse Practitioner

## 2020-06-15 NOTE — Telephone Encounter (Signed)
Phone call to patient to verify medication list and allergies for myelogram procedure. Pt aware she will not need to hold any medications for this procedure. Pre and post procedure instructions reviewed with pt. Pt verbalized understanding. 

## 2020-06-21 ENCOUNTER — Ambulatory Visit
Admission: RE | Admit: 2020-06-21 | Discharge: 2020-06-21 | Disposition: A | Discharge status: Home / Self Care | Payer: Medicare Other | Source: Ambulatory Visit | Attending: Orthopaedic Surgery | Admitting: Orthopaedic Surgery

## 2020-06-21 ENCOUNTER — Other Ambulatory Visit: Payer: Self-pay

## 2020-06-21 DIAGNOSIS — I739 Peripheral vascular disease, unspecified: Secondary | ICD-10-CM

## 2020-06-21 MED ORDER — IOPAMIDOL (ISOVUE-M 200) INJECTION 41%
18.0000 mL | Freq: Once | INTRAMUSCULAR | Status: AC
  Administered 2020-06-21: 18 mL via INTRATHECAL

## 2020-06-21 MED ORDER — DIAZEPAM 5 MG PO TABS
5.0000 mg | ORAL_TABLET | Freq: Once | ORAL | Status: AC
  Administered 2020-06-21: 5 mg via ORAL

## 2020-06-21 NOTE — Discharge Instructions (Signed)

## 2020-06-22 ENCOUNTER — Other Ambulatory Visit: Payer: Self-pay | Admitting: Cardiology

## 2020-07-05 ENCOUNTER — Encounter: Payer: Self-pay | Admitting: Orthopaedic Surgery

## 2020-07-05 ENCOUNTER — Ambulatory Visit (INDEPENDENT_AMBULATORY_CARE_PROVIDER_SITE_OTHER): Payer: Medicare Other | Admitting: Orthopaedic Surgery

## 2020-07-05 DIAGNOSIS — I6523 Occlusion and stenosis of bilateral carotid arteries: Secondary | ICD-10-CM | POA: Diagnosis not present

## 2020-07-05 DIAGNOSIS — M7061 Trochanteric bursitis, right hip: Secondary | ICD-10-CM

## 2020-07-05 DIAGNOSIS — M48061 Spinal stenosis, lumbar region without neurogenic claudication: Secondary | ICD-10-CM | POA: Diagnosis not present

## 2020-07-05 MED ORDER — METHYLPREDNISOLONE ACETATE 40 MG/ML IJ SUSP
40.0000 mg | INTRAMUSCULAR | Status: AC | PRN
  Administered 2020-07-05: 40 mg via INTRA_ARTICULAR

## 2020-07-05 MED ORDER — LIDOCAINE HCL 1 % IJ SOLN
0.5000 mL | INTRAMUSCULAR | Status: AC | PRN
  Administered 2020-07-05: .5 mL

## 2020-07-05 MED ORDER — BUPIVACAINE HCL 0.25 % IJ SOLN
2.0000 mL | INTRAMUSCULAR | Status: AC | PRN
  Administered 2020-07-05: 2 mL via INTRA_ARTICULAR

## 2020-07-05 NOTE — Progress Notes (Signed)
Office Visit Note   Patient: Heather Livingston           Date of Birth: [birth year]           MRN: [id number] Visit Date: 07/05/2020              Requested by: Theodoro Kos, MD [id number] [place],  [place] [address] PCP: Theodoro Kos, MD   Assessment & Plan: Visit Diagnoses:  1. Trochanteric bursitis, right hip   2. Lumbar foraminal stenosis     Plan: Injection performed.  MRI scan was reviewed I gave her a copy of the report.  I will check her back in 6 weeks she noted 50% improvement in her right lateral hip pain and buttocks pain with the trochanteric injection states it felt much better down to her knee.  Follow-Up Instructions: Return in about 6 weeks (around 08/16/2020).   Orders:  Orders Placed This Encounter  Procedures  . Large Joint Inj: R greater trochanter   No orders of the defined types were placed in this encounter.     Procedures: Large Joint Inj: R greater trochanter on 07/05/2020 9:52 AM Medications: 0.5 mL lidocaine 1 %; 2 mL bupivacaine 0.25 %; 40 mg methylPREDNISolone acetate 40 MG/ML      Clinical Data: No additional findings.   Subjective: Chief Complaint  Patient presents with  . Lower Back - Follow-up    CT/Lumbar Myelo Review    HPI 69 year old female returns with continued problems with sharp pain when she stands or walks she states she has terrible problems going up a hill.  She has had previous abdominal aortic and biiliac stenting.  She has significant constipation but ABIs were satisfactory.  Patient had myelogram CT scan which is available for review.  This shows severe foraminal stenosis on the right due to facet spurring at L5-S1 and mild to moderate on the right at L4-5.  Patient states therapy aggravates her symptoms she does better leaning on a grocery cart.  Review of Systems all other systems update from 04/26/2020 office note are unchanged.  Quit smoking 2009 at the time of open heart surgery positive for  claudication symptoms with standing or walking but some pains constant.  Objective: Vital Signs: Ht 5' 5.5" (1.664 m)   Wt 190 lb (86.2 kg)   BMI 31.14 kg/m   Physical Exam Constitutional:      Appearance: She is well-developed.  HENT:     Head: Normocephalic.     Right Ear: External ear normal.     Left Ear: External ear normal.  Eyes:     Pupils: Pupils are equal, round, and reactive to light.  Neck:     Thyroid: No thyromegaly.     Trachea: No tracheal deviation.  Cardiovascular:     Rate and Rhythm: Normal rate.  Pulmonary:     Effort: Pulmonary effort is normal.  Abdominal:     Palpations: Abdomen is soft.  Skin:    General: Skin is warm and dry.  Neurological:     Mental Status: She is alert and oriented to person, place, and time.  Psychiatric:        Behavior: Behavior normal.     Ortho Exam patient has tenderness lumbosacral junction some sciatic notch tenderness moderate to severe right trochanteric bursal tenderness.  Negative straight leg raising 90 degrees.  Reflexes are 2+ and symmetrical. Specialty Comments:  No specialty comments available.  Imaging: CLINICAL DATA:  Chronic low back pain  radiating into the right greater than left hips and intermittently down the right leg. No prior surgery.  EXAM: LUMBAR MYELOGRAM  CT LUMBAR MYELOGRAM  FLUOROSCOPY TIME:  Radiation Exposure Index (as provided by the fluoroscopic device): 29.1 mGy  Fluoroscopy Time:  1 minute, 42 seconds  Number of Acquired Images:  17  PROCEDURE: After thorough discussion of risks and benefits of the procedure including bleeding, infection, injury to nerves, blood vessels, adjacent structures as well as headache and CSF leak, written and oral informed consent was obtained. Consent was obtained by Dr. Malachi Pro. Time out form was completed.  Patient was positioned prone on the fluoroscopy table. Local anesthesia was provided with 1% lidocaine without epinephrine  after prepped and draped in the usual sterile fashion. Puncture was performed at L2-L3 using a 3 1/2 inch 22-gauge spinal needle via right interlaminar approach. Using a single pass through the dura, the needle was placed within the thecal sac, with return of clear CSF. 15 mL of Isovue M-200 was injected into the thecal sac, with normal opacification of the nerve roots and cauda equina consistent with free flow within the subarachnoid space.  I personally performed the lumbar puncture and administered the intrathecal contrast. I also personally supervised acquisition of the myelogram images.  TECHNIQUE: Contiguous axial images were obtained through the lumbar spine after the intrathecal infusion of contrast. Coronal and sagittal reconstructions were obtained of the axial image sets.  COMPARISON:  CTA abdomen and pelvis dated May 21, 2018.  FINDINGS: LUMBAR MYELOGRAM FINDINGS:  Trace retrolisthesis at L4-L5. No dynamic instability. Small ventral extradural defects from L2-L3 through L5-S1. Underfilling of the right greater than left L5 nerve roots. No spinal canal stenosis.  CT LUMBAR MYELOGRAM FINDINGS:  Segmentation: Standard.  Alignment: Trace retrolisthesis at L4-L5.  Vertebrae: No acute fracture or other focal pathologic process.  Conus medullaris and cauda equina: Conus extends to the L1 level. Conus and cauda equina appear normal.  Paraspinal and other soft tissues: Unchanged left adrenal gland thickening. Prior EVAR. Stable to slightly decreased infrarenal abdominal aortic aneurysm currently measuring 3.3 x 3.6 cm, previously 3.5 x 3.7 cm.  Disc levels:  T12-L1:  Negative.  L1-L2:  Negative.  L2-L3:  Mild disc bulging.  No stenosis.  L3-L4:  Mild disc bulging.  No stenosis.  L4-L5: Mild-to-moderate disc bulging. Mild bilateral facet arthropathy. Mild to moderate right lateral recess stenosis. Mild left neuroforaminal stenosis. No spinal  canal or right neuroforaminal stenosis.  L5-S1: Mild-to-moderate disc bulging. Mild bilateral facet arthropathy. Moderate to severe right and mild left neuroforaminal stenosis. No spinal canal stenosis.  IMPRESSION: 1. Multilevel lumbar spondylosis as described above. Moderate to severe right neuroforaminal stenosis at L5-S1. 2. Mild to moderate right lateral recess stenosis at L4-L5. 3. Stable to slightly decreased size of the infrarenal abdominal aortic aneurysm status post EVAR.   Electronically Signed   By: Obie Dredge M.D.   On: 06/21/2020 11:28   PMFS History: Patient Active Problem List   Diagnosis Date Noted  . Trochanteric bursitis, right hip 07/05/2020  . Lumbar foraminal stenosis 07/05/2020  . Claudication (HCC) 06/14/2020  . AAA (abdominal aortic aneurysm) (HCC) 05/05/2018  . Cerebral aneurysm, nonruptured 03/18/2018  . Chest pain 01/23/2018  . GERD (gastroesophageal reflux disease) 01/23/2018  . OSA (obstructive sleep apnea) 01/23/2018  . Aneurysm of infrarenal abdominal aorta (HCC) 01/21/2018  . 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 Unstable angina (HCC) 01/19/2018  . Essential hypertension, benign 10/29/2010  . CORONARY ATHEROSCLEROSIS NATIVE  CORONARY ARTERY 10/29/2010  . SUPRAVENTRICULAR TACHYCARDIA 09/13/2010  . Hyperlipemia 08/27/2010  . 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 Palpitations 08/27/2010   Past Medical History:  Diagnosis Date  . Abdominal aortic aneurysm (AAA) (HCC)   . Arthritis    back  . Bradycardia   . Brain aneurysm   . Cataracts, bilateral   . Coronary atherosclerosis of native coronary artery    DES circ 10/10, residual 100% occluded SVG to OM, patent LIMA to LAD and SVG to PDA grafts, LVEF 50%  . GERD (gastroesophageal reflux disease)   . History of hiatal hernia   . History of stroke    Incidental findings by CT imaging  . Hyperlipemia   . Hypertension   . Low iron   . MI (myocardial infarction) (HCC)   .  Nonruptured cerebral aneurysm   . OSA (obstructive sleep apnea)    On CPAP  . Pneumonia   . Restless legs     Family History  Problem Relation Age of Onset  . Hypertension Mother   . Heart attack Father   . Hypertension Brother     Past Surgical History:  Procedure Laterality Date  . ABDOMINAL AORTIC ENDOVASCULAR STENT GRAFT N/A 05/05/2018   Procedure: ABDOMINAL AORTIC ENDOVASCULAR STENT GRAFT;  Surgeon: Nada LibmanBrabham, Vance W, MD;  [facility];  Service: Vascular;  Laterality: N/A;  . ABDOMINAL AORTOGRAM N/A 01/19/2018   Procedure: ABDOMINAL AORTOGRAM;  Surgeon: Corky CraftsVaranasi, Jayadeep S, MD;  [facility];  Service: Cardiovascular;  Laterality: N/A;  . ABDOMINAL HYSTERECTOMY    . CARPAL TUNNEL RELEASE    . CORONARY ARTERY BYPASS GRAFT  2009   [facility], LIMA to LAD, SVG to OM, SVG to PDA  . ENDOVASCULAR REPAIR/STENT GRAFT  05/05/2018  . IR ANGIO INTRA EXTRACRAN SEL INTERNAL CAROTID BILAT MOD SED  02/23/2018  . IR ANGIO VERTEBRAL SEL SUBCLAVIAN INNOMINATE UNI R MOD SED  04/14/2019  . IR ANGIO VERTEBRAL SEL VERTEBRAL BILAT MOD SED  02/23/2018  . IR ANGIO VERTEBRAL SEL VERTEBRAL UNI L MOD SED  03/18/2018  . IR ANGIOGRAM FOLLOW UP STUDY  03/18/2018  . IR ANGIOGRAM FOLLOW UP STUDY  03/18/2018  . IR ANGIOGRAM FOLLOW UP STUDY  03/18/2018  . IR ANGIOGRAM FOLLOW UP STUDY  03/18/2018  . IR TRANSCATH/EMBOLIZ  03/18/2018  . IR US GUIDE VASC ACCESS RIGHT  04/14/2019  . LEFT HEART CATH AND CORS/GRAFTS ANGIOGRAPHY N/A 01/19/2018   Procedure: LEFT HEART CATH AND CORS/GRAFTS ANGIOGRAPHY;  Surgeon: Corky CraftsVaranasi, Jayadeep S, MD;  [facility];  Service: Cardiovascular;  Laterality: N/A;  . LEFT HEART CATHETERIZATION WITH CORONARY/GRAFT ANGIOGRAM N/A 12/27/2012   Procedure: LEFT HEART CATHETERIZATION WITH Isabel CapriceORONARY/GRAFT ANGIOGRAM;  Surgeon: Herby Abrahamhomas D Stuckey, MD;  [facility];  Service: Cardiovascular;  Laterality: N/A;  . RADIOLOGY WITH ANESTHESIA N/A 03/18/2018   Procedure: coil  embolization of basilar aneurysm;  Surgeon: Lisbeth RenshawNundkumar, Neelesh, MD;  [facility];  Service: Radiology;  Laterality: N/A;   Social History   Occupational History  . Occupation: Disabled  Tobacco Use  . Smoking status: Former Smoker    Packs/day: 0.75    Years: 43.00    Pack years: 32.25    Types: Cigarettes    Start date: 09/13/1965    Quit date: 10/14/2007    Years since quitting: 12.7  . Smokeless tobacco: Never Used  Vaping Use  . Vaping Use: Never used  Substance and Sexual Activity  . Alcohol use: Yes  Alcohol/week: 0.0 standard drinks    Comment: Occasional wine or beer  . Drug use: No  . Sexual activity: Not on file

## 2020-07-27 ENCOUNTER — Other Ambulatory Visit: Payer: Self-pay | Admitting: Cardiology

## 2020-07-31 ENCOUNTER — Other Ambulatory Visit: Payer: Self-pay | Admitting: Cardiology

## 2020-08-02 ENCOUNTER — Ambulatory Visit (INDEPENDENT_AMBULATORY_CARE_PROVIDER_SITE_OTHER): Payer: Medicare Other | Admitting: Cardiology

## 2020-08-02 ENCOUNTER — Other Ambulatory Visit: Payer: Self-pay

## 2020-08-02 ENCOUNTER — Encounter: Payer: Self-pay | Admitting: Cardiology

## 2020-08-02 VITALS — BP 138/68 | HR 74 | Ht 65.5 in | Wt 210.8 lb

## 2020-08-02 DIAGNOSIS — I25119 Atherosclerotic heart disease of native coronary artery with unspecified angina pectoris: Secondary | ICD-10-CM

## 2020-08-02 DIAGNOSIS — E782 Mixed hyperlipidemia: Secondary | ICD-10-CM

## 2020-08-02 DIAGNOSIS — I6523 Occlusion and stenosis of bilateral carotid arteries: Secondary | ICD-10-CM

## 2020-08-02 MED ORDER — NITROGLYCERIN 0.4 MG SL SUBL: 0.4000 mg | SUBLINGUAL_TABLET | SUBLINGUAL | 3 refills | Status: DC | PRN

## 2020-08-02 NOTE — Patient Instructions (Addendum)

## 2020-08-02 NOTE — Progress Notes (Signed)
Cardiology Office Note  Date: 08/02/2020   ID: Heather, Livingston 1951/03/17, MRN [id number]  PCP:  Theodoro Kos, MD  Cardiologist:  Nona Dell, MD Electrophysiologist:  None   Chief Complaint  Patient presents with  . Cardiac follow-up    History of Present Illness: Heather Livingston is a 69 y.o. female last seen in April. She presents for a routine visit. She does not report any active angina at this time, no nitroglycerin use.  She had follow-up with [facility] in July, I reviewed the office note.  I went over her cardiac medications which are stable and outlined below. She reports compliance and no obvious intolerances. Lipid profile in October of last year revealed LDL 45. She continues to follow with Dr. Melvyn Neth. We did discuss getting a refill for fresh bottle of nitroglycerin.  Past Medical History:  Diagnosis Date  . Abdominal aortic aneurysm (AAA) (HCC)   . Arthritis   . Bradycardia   . Brain aneurysm   . Cataracts, bilateral   . Coronary atherosclerosis of native coronary artery    DES circ 10/10, residual 100% occluded SVG to OM, patent LIMA to LAD and SVG to PDA grafts, LVEF 50%  . GERD (gastroesophageal reflux disease)   . History of hiatal hernia   . History of stroke    Incidental findings by CT imaging  . Hyperlipemia   . Hypertension   . Low iron   . MI (myocardial infarction) (HCC)   . Nonruptured cerebral aneurysm   . OSA (obstructive sleep apnea)    On CPAP  . Pneumonia   . Restless legs     Past Surgical History:  Procedure Laterality Date  . ABDOMINAL AORTIC ENDOVASCULAR STENT GRAFT N/A 05/05/2018   Procedure: ABDOMINAL AORTIC ENDOVASCULAR STENT GRAFT;  Surgeon: Nada Libman, MD;  Location: [facility] OR;  Service: Vascular;  Laterality: N/A;  . ABDOMINAL AORTOGRAM N/A 01/19/2018   Procedure: ABDOMINAL AORTOGRAM;  Surgeon: Corky Crafts, MD;  Location: [facility] INVASIVE CV LAB;  Service: Cardiovascular;  Laterality: N/A;  . ABDOMINAL HYSTERECTOMY     . CARPAL TUNNEL RELEASE    . CORONARY ARTERY BYPASS GRAFT  2009   [facility], LIMA to LAD, SVG to OM, SVG to PDA  . ENDOVASCULAR REPAIR/STENT GRAFT  05/05/2018  . IR ANGIO INTRA EXTRACRAN SEL INTERNAL CAROTID BILAT MOD SED  02/23/2018  . IR ANGIO VERTEBRAL SEL SUBCLAVIAN INNOMINATE UNI R MOD SED  04/14/2019  . IR ANGIO VERTEBRAL SEL VERTEBRAL BILAT MOD SED  02/23/2018  . IR ANGIO VERTEBRAL SEL VERTEBRAL UNI L MOD SED  03/18/2018  . IR ANGIOGRAM FOLLOW UP STUDY  03/18/2018  . IR ANGIOGRAM FOLLOW UP STUDY  03/18/2018  . IR ANGIOGRAM FOLLOW UP STUDY  03/18/2018  . IR ANGIOGRAM FOLLOW UP STUDY  03/18/2018  . IR TRANSCATH/EMBOLIZ  03/18/2018  . IR US GUIDE VASC ACCESS RIGHT  04/14/2019  . LEFT HEART CATH AND CORS/GRAFTS ANGIOGRAPHY N/A 01/19/2018   Procedure: LEFT HEART CATH AND CORS/GRAFTS ANGIOGRAPHY;  Surgeon: Corky Crafts, MD;  Location: [facility] INVASIVE CV LAB;  Service: Cardiovascular;  Laterality: N/A;  . LEFT HEART CATHETERIZATION WITH CORONARY/GRAFT ANGIOGRAM N/A 12/27/2012   Procedure: LEFT HEART CATHETERIZATION WITH Isabel Caprice;  Surgeon: Herby Abraham, MD;  Location: [facility] CATH LAB;  Service: Cardiovascular;  Laterality: N/A;  . RADIOLOGY WITH ANESTHESIA N/A 03/18/2018   Procedure: coil embolization of basilar aneurysm;  Surgeon: Lisbeth Renshaw, MD;  Location: [facility] OR;  Service:  Radiology;  Laterality: N/A;    Current Outpatient Medications  Medication Sig Dispense Refill  . albuterol (VENTOLIN HFA) 108 (90 Base) MCG/ACT inhaler Inhale 1-2 puffs into the lungs every 6 (six) hours as needed for shortness of breath.     Marland Kitchen amLODipine (NORVASC) 5 MG tablet TAKE 1 TABLET BY MOUTH EVERY DAY 90 tablet 3  . aspirin EC 81 MG tablet Take 1 tablet (81 mg total) by mouth daily.    . clopidogrel (PLAVIX) 75 MG tablet TAKE 1 TABLET BY MOUTH EVERY DAY 90 tablet 2  . famotidine (PEPCID) 40 MG tablet Take 1 tablet by mouth at bedtime.    . fluticasone (FLONASE) 50 MCG/ACT nasal spray Place 2 sprays  into both nostrils at bedtime.    . hydrALAZINE (APRESOLINE) 100 MG tablet TAKE 1 TABLET BY MOUTH THREE TIMES A DAY 270 tablet 3  . hydrochlorothiazide (HYDRODIURIL) 12.5 MG tablet TAKE 2 TABLETS BY MOUTH EVERY DAY 180 tablet 2  . isosorbide mononitrate (IMDUR) 60 MG 24 hr tablet TAKE 1 TABLET BY MOUTH EVERY DAY 90 tablet 3  . losartan (COZAAR) 100 MG tablet TAKE 1 TABLET BY MOUTH EVERY DAY 90 tablet 1  . magnesium oxide (MAG-OX) 400 (241.3 Mg) MG tablet Take 400 mg by mouth daily.   3  . metoprolol tartrate (LOPRESSOR) 25 MG tablet TAKE 1 TABLET BY MOUTH TWICE A DAY 180 tablet 2  . montelukast (SINGULAIR) 10 MG tablet Take 10 mg by mouth daily.    . nitroGLYCERIN (NITROSTAT) 0.4 MG SL tablet Place 1 tablet (0.4 mg total) under the tongue every 5 (five) minutes x 3 doses as needed for chest pain (if no relief after 3rd dose, proceed to the ED for an evaluation or call 911). 25 tablet 3  . simvastatin (ZOCOR) 40 MG tablet Take 1 tablet (40 mg total) by mouth at bedtime. 90 tablet 2  . spironolactone (ALDACTONE) 25 MG tablet TAKE 1/2 TABLET BY MOUTH EVERY DAY 45 tablet 3   No current facility-administered medications for this visit.   Allergies:  Patient has no known allergies.   ROS:  No syncope.  Physical Exam: VS:  BP 138/68   Pulse 74   Ht 5' 5.5" (1.664 m)   Wt 210 lb 12.8 oz (95.6 kg)   SpO2 91%   BMI 34.55 kg/m , BMI Body mass index is 34.55 kg/m.  Wt Readings from Last 3 Encounters:  08/02/20 210 lb 12.8 oz (95.6 kg)  07/05/20 190 lb (86.2 kg)  06/14/20 193 lb (87.5 kg)    General: Patient appears comfortable at rest. HEENT: Conjunctiva and lids normal, wearing a mask. Neck: Supple, no elevated JVP or carotid bruits, no thyromegaly. Lungs: Clear to auscultation, nonlabored breathing at rest. Cardiac: Regular rate and rhythm with ectopy, no S3 or significant systolic murmur, no pericardial rub. Extremities: No pitting edema, distal pulses 2+.  ECG:  An ECG dated 01/30/2020  was personally reviewed today and demonstrated:  Sinus rhythm with PVCs, diffuse ST-T wave abnormalities, possible old inferior infarct pattern.  Recent Labwork:  October 2020: Cholesterol 90, HDL 28, LDL 45, triglycerides 71  Other Studies Reviewed Today:  Cardiac catheterization 01/19/2018:  Prox RCA lesion is 100% stenosed. SVG to PDA is patent.  Prox LAD lesion is 100% stenosed. LIMA to LAD is patent.  Mid LM to Dist LM lesion is 25% stenosed.  Mid Cx lesion is 50% stenosed. SVG to OM is occluded, known from prior.  The left ventricular systolic  function is normal.  LV end diastolic pressure is mildly elevated.  The left ventricular ejection fraction is 50-55% by visual estimate.  There is no aortic valve stenosis.  Infrarenal AAA. PAD including right common iliac artery stenosis.  Bilateral common iliac arteries appear ectatic.  Medical therapy for CAD. Stable from prior cath. No acute lesion detected.  Assessment and Plan:  1. Multivessel CAD status post CABG as well as DES to the circumflex. She is clinically stable without any active angina at this time. Plan to continue medical therapy and observation. She is currently on aspirin, Plavix, Norvasc, hydralazine, Imdur, losartan, Lopressor, Aldactone, and Zocor.  2. Mixed hyperlipidemia, continues on Zocor. Last LDL 45.  3. History of abdominal aortic aneurysm and right common iliac aneurysm, status post EVAR. She continues to follow with [facility].  Medication Adjustments/Labs and Tests Ordered: Current medicines are reviewed at length with the patient today.  Concerns regarding medicines are outlined above.   Tests Ordered: No orders of the defined types were placed in this encounter.   Medication Changes: Meds ordered this encounter  Medications  . nitroGLYCERIN (NITROSTAT) 0.4 MG SL tablet    Sig: Place 1 tablet (0.4 mg total) under the tongue every 5 (five) minutes x 3 doses as needed for chest pain (if no  relief after 3rd dose, proceed to the ED for an evaluation or call 911).    Dispense:  25 tablet    Refill:  3    Disposition:  Follow up 6 months in the [place] office.  Signed, Jonelle Sidle, MD, [facility] 08/02/2020 3:44 PM     [facility] [address] [place], [place], [place] [address] Phone: [phone]; Fax: [phone]

## 2020-08-16 ENCOUNTER — Ambulatory Visit (INDEPENDENT_AMBULATORY_CARE_PROVIDER_SITE_OTHER): Payer: Medicare Other | Admitting: Orthopaedic Surgery

## 2020-08-16 ENCOUNTER — Other Ambulatory Visit: Payer: Self-pay

## 2020-08-16 ENCOUNTER — Encounter: Payer: Self-pay | Admitting: Orthopaedic Surgery

## 2020-08-16 VITALS — Ht 65.5 in | Wt 210.0 lb

## 2020-08-16 DIAGNOSIS — M48061 Spinal stenosis, lumbar region without neurogenic claudication: Secondary | ICD-10-CM

## 2020-08-16 DIAGNOSIS — I6523 Occlusion and stenosis of bilateral carotid arteries: Secondary | ICD-10-CM

## 2020-08-16 NOTE — Addendum Note (Signed)
Addended by: Rogers Seeds on: 08/16/2020 10:01 AM   Modules accepted: Orders

## 2020-08-16 NOTE — Progress Notes (Signed)
Office Visit Note   Patient: Heather Livingston           Date of Birth: August 20, 1951           MRN: [id number] Visit Date: 08/16/2020              Requested by: Theodoro Kos, MD [id number] [place],  [place] [address] PCP: Theodoro Kos, MD   Assessment & Plan: Visit Diagnoses:  1. Lumbar foraminal stenosis     Plan: We will have her see Dr. Alvester Morin for foraminal injections on the right at the bone to lumbar levels and see how she does with this.  Recheck 8 weeks.  Follow-Up Instructions: Return in about 8 weeks (around 10/11/2020).   Orders:  No orders of the defined types were placed in this encounter.  No orders of the defined types were placed in this encounter.     Procedures: No procedures performed   Clinical Data: No additional findings.   Subjective: Chief Complaint  Patient presents with  . Lower Back - Follow-up, Pain    HPI 68 year old female returns with ongoing problems with pain with standing pain with walking worse on the right than left that radiates into her right thigh most times stops at the knee.  She also has inability to walk more than 50 or 100 steps and has to stop and sit and rest.  Patient has had multiple stents this severe PAD.  She has some stents in her brain and is not able get an MRI.  Myelogram CT scan 06/21/2020 showed right moderate lateral recess stenosis at L4-5 and moderate to severe neuroforaminal stenosis L5S1 right side.  She did not have any compression on the left side.  Patient was previous smoker has history of multiple aneurysms.  Review of Systems updated unchanged from previous visit.   Objective: Vital Signs: Ht 5' 5.5" (1.664 m)   Wt 210 lb (95.3 kg)   BMI 34.41 kg/m   Physical Exam Constitutional:      Appearance: She is well-developed.  HENT:     Head: Normocephalic.     Right Ear: External ear normal.     Left Ear: External ear normal.  Eyes:     Pupils: Pupils are equal, round, and reactive to  light.  Neck:     Thyroid: No thyromegaly.     Trachea: No tracheal deviation.  Cardiovascular:     Rate and Rhythm: Normal rate.  Pulmonary:     Effort: Pulmonary effort is normal.  Abdominal:     Palpations: Abdomen is soft.  Skin:    General: Skin is warm and dry.  Neurological:     Mental Status: She is alert and oriented to person, place, and time.  Psychiatric:        Behavior: Behavior normal.     Ortho Exam patient has some pain with straight leg raising some sciatic notch tenderness on the right minimal on the left negative straight leg raising on the left knee and ankle jerk are intact anterior tib EHL gastrocsoleus is strong she still has mild to moderate trochanteric bursal tenderness on the right none on the left.  Specialty Comments:  No specialty comments available.  Imaging: T12-L1:  Negative.  L1-L2:  Negative.  L2-L3:  Mild disc bulging.  No stenosis.  L3-L4:  Mild disc bulging.  No stenosis.  L4-L5: Mild-to-moderate disc bulging. Mild bilateral facet arthropathy. Mild to moderate right lateral recess stenosis. Mild left neuroforaminal stenosis.  No spinal canal or right neuroforaminal stenosis.  L5-S1: Mild-to-moderate disc bulging. Mild bilateral facet arthropathy. Moderate to severe right and mild left neuroforaminal stenosis. No spinal canal stenosis.  IMPRESSION: 1. Multilevel lumbar spondylosis as described above. Moderate to severe right neuroforaminal stenosis at L5-S1. 2. Mild to moderate right lateral recess stenosis at L4-L5. 3. Stable to slightly decreased size of the infrarenal abdominal aortic aneurysm status post EVAR.   Electronically Signed   By: Obie Dredge M.D.   On: 06/21/2020 11:28    PMFS History: Patient Active Problem List   Diagnosis Date Noted  . Trochanteric bursitis, right hip 07/05/2020  . Lumbar foraminal stenosis 07/05/2020  . Claudication (HCC) 06/14/2020  . AAA (abdominal aortic aneurysm)  (HCC) 05/05/2018  . Cerebral aneurysm, nonruptured 03/18/2018  . Chest pain 01/23/2018  . GERD (gastroesophageal reflux disease) 01/23/2018  . OSA (obstructive sleep apnea) 01/23/2018  . Aneurysm of infrarenal abdominal aorta (HCC) 01/21/2018  . 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 Unstable angina (HCC) 01/19/2018  . Essential hypertension, benign 10/29/2010  . CORONARY ATHEROSCLEROSIS NATIVE CORONARY ARTERY 10/29/2010  . SUPRAVENTRICULAR TACHYCARDIA 09/13/2010  . Hyperlipemia 08/27/2010  . 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 Palpitations 08/27/2010   Past Medical History:  Diagnosis Date  . Abdominal aortic aneurysm (AAA) (HCC)   . Arthritis   . Bradycardia   . Brain aneurysm   . Cataracts, bilateral   . Coronary atherosclerosis of native coronary artery    DES circ 10/10, residual 100% occluded SVG to OM, patent LIMA to LAD and SVG to PDA grafts, LVEF 50%  . GERD (gastroesophageal reflux disease)   . History of hiatal hernia   . History of stroke    Incidental findings by CT imaging  . Hyperlipemia   . Hypertension   . Low iron   . MI (myocardial infarction) (HCC)   . Nonruptured cerebral aneurysm   . OSA (obstructive sleep apnea)    On CPAP  . Pneumonia   . Restless legs     Family History  Problem Relation Age of Onset  . Hypertension Mother   . Heart attack Father   . Hypertension Brother     Past Surgical History:  Procedure Laterality Date  . ABDOMINAL AORTIC ENDOVASCULAR STENT GRAFT N/A 05/05/2018   Procedure: ABDOMINAL AORTIC ENDOVASCULAR STENT GRAFT;  Surgeon: Nada Libman, MD;  Location: [facility] OR;  Service: Vascular;  Laterality: N/A;  . ABDOMINAL AORTOGRAM N/A 01/19/2018   Procedure: ABDOMINAL AORTOGRAM;  Surgeon: Corky Crafts, MD;  Location: [facility] INVASIVE CV LAB;  Service: Cardiovascular;  Laterality: N/A;  . ABDOMINAL HYSTERECTOMY    . CARPAL TUNNEL RELEASE    . CORONARY ARTERY BYPASS GRAFT  2009   [facility], LIMA to LAD, SVG to OM, SVG to  PDA  . ENDOVASCULAR REPAIR/STENT GRAFT  05/05/2018  . IR ANGIO INTRA EXTRACRAN SEL INTERNAL CAROTID BILAT MOD SED  02/23/2018  . IR ANGIO VERTEBRAL SEL SUBCLAVIAN INNOMINATE UNI R MOD SED  04/14/2019  . IR ANGIO VERTEBRAL SEL VERTEBRAL BILAT MOD SED  02/23/2018  . IR ANGIO VERTEBRAL SEL VERTEBRAL UNI L MOD SED  03/18/2018  . IR ANGIOGRAM FOLLOW UP STUDY  03/18/2018  . IR ANGIOGRAM FOLLOW UP STUDY  03/18/2018  . IR ANGIOGRAM FOLLOW UP STUDY  03/18/2018  . IR ANGIOGRAM FOLLOW UP STUDY  03/18/2018  . IR TRANSCATH/EMBOLIZ  03/18/2018  . IR US GUIDE VASC ACCESS RIGHT  04/14/2019  . LEFT HEART CATH AND CORS/GRAFTS ANGIOGRAPHY N/A  01/19/2018   Procedure: LEFT HEART CATH AND CORS/GRAFTS ANGIOGRAPHY;  Surgeon: Corky Crafts, MD;  Location: [facility] INVASIVE CV LAB;  Service: Cardiovascular;  Laterality: N/A;  . LEFT HEART CATHETERIZATION WITH CORONARY/GRAFT ANGIOGRAM N/A 12/27/2012   Procedure: LEFT HEART CATHETERIZATION WITH Isabel Caprice;  Surgeon: Herby Abraham, MD;  Location: [facility] CATH LAB;  Service: Cardiovascular;  Laterality: N/A;  . RADIOLOGY WITH ANESTHESIA N/A 03/18/2018   Procedure: coil embolization of basilar aneurysm;  Surgeon: Lisbeth Renshaw, MD;  Location: [facility] OR;  Service: Radiology;  Laterality: N/A;   Social History   Occupational History  . Occupation: Disabled  Tobacco Use  . Smoking status: Former Smoker    Packs/day: 0.75    Years: 43.00    Pack years: 32.25    Types: Cigarettes    Start date: 09/13/1965    Quit date: 10/14/2007    Years since quitting: 12.8  . Smokeless tobacco: Never Used  Vaping Use  . Vaping Use: Never used  Substance and Sexual Activity  . Alcohol use: Yes    Alcohol/week: 0.0 standard drinks    Comment: Occasional wine or beer  . Drug use: No  . Sexual activity: Not on file

## 2020-09-07 ENCOUNTER — Other Ambulatory Visit: Payer: Self-pay | Admitting: Cardiology

## 2020-09-13 ENCOUNTER — Ambulatory Visit: Payer: Self-pay

## 2020-09-13 ENCOUNTER — Ambulatory Visit (INDEPENDENT_AMBULATORY_CARE_PROVIDER_SITE_OTHER): Payer: Medicare Other | Admitting: Physical Medicine and Rehabilitation

## 2020-09-13 ENCOUNTER — Other Ambulatory Visit: Payer: Self-pay

## 2020-09-13 ENCOUNTER — Encounter: Payer: Self-pay | Admitting: Physical Medicine and Rehabilitation

## 2020-09-13 VITALS — BP 135/80 | HR 61

## 2020-09-13 DIAGNOSIS — M5416 Radiculopathy, lumbar region: Secondary | ICD-10-CM

## 2020-09-13 MED ORDER — DEXAMETHASONE SODIUM PHOSPHATE 10 MG/ML IJ SOLN
15.0000 mg | Freq: Once | INTRAMUSCULAR | Status: AC
  Administered 2020-09-13: 15 mg

## 2020-09-13 NOTE — Progress Notes (Signed)
ELBERTA LACHAPELLE - 69 y.o. female MRN [id number]  Date of birth: 05/16/1951  Office Visit Note: Visit Date: 09/13/2020 PCP: Theodoro Kos, MD Referred by: Theodoro Kos, MD  Subjective: Chief Complaint  Patient presents with  . Lower Back - Pain  . Right Hip - Pain  . Left Hip - Pain  . Right Leg - Pain   HPI:  KARI MONTERO is a 69 y.o. female who comes in today at the request of Dr. Annell Greening for planned Right L4-L5 and L5-S1 Lumbar epidural steroid injection with fluoroscopic guidance.  The patient has failed conservative care including home exercise, medications, time and activity modification.  This injection will be diagnostic and hopefully therapeutic.  Please see requesting physician notes for further details and justification.   ROS Otherwise per HPI.  Assessment & Plan: Visit Diagnoses:    ICD-10-CM   1. Lumbar radiculopathy  M54.16 XR C-ARM NO REPORT    Epidural Steroid injection    dexamethasone (DECADRON) injection 15 mg    Plan: No additional findings.   Meds & Orders:  Meds ordered this encounter  Medications  . dexamethasone (DECADRON) injection 15 mg    Orders Placed This Encounter  Procedures  . XR C-ARM NO REPORT  . Epidural Steroid injection    Follow-up: Return if symptoms worsen or fail to improve.   Procedures: No procedures performed  Lumbosacral Transforaminal Epidural Steroid Injection - Sub-Pedicular Approach with Fluoroscopic Guidance  Patient: SAVVY PEETERS      Date of Birth: 02-05-1951 MRN: [id number] PCP: Theodoro Kos, MD      Visit Date: 09/13/2020   Universal Protocol:    Date/Time: 09/13/2020  Consent Given By: the patient  Position: PRONE  Additional Comments: Vital signs were monitored before and after the procedure. Patient was prepped and draped in the usual sterile fashion. The correct patient, procedure, and site was verified.   Injection Procedure Details:   Procedure diagnoses: Lumbar radiculopathy  [M54.16]    Meds Administered:  Meds ordered this encounter  Medications  . dexamethasone (DECADRON) injection 15 mg    Laterality: Right  Location/Site:  L4-L5 L5-S1  Needle:5.0 in., 22 ga.  Short bevel or Quincke spinal needle  Needle Placement: Transforaminal  Findings:    -Comments: Excellent flow of contrast along the nerve, nerve root and into the epidural space.  Procedure Details: After squaring off the end-plates to get a true AP view, the C-arm was positioned so that an oblique view of the foramen as noted above was visualized. The target area is just inferior to the "nose of the scotty dog" or sub pedicular. The soft tissues overlying this structure were infiltrated with 2-3 ml. of 1% Lidocaine without Epinephrine.  The spinal needle was inserted toward the target using a "trajectory" view along the fluoroscope beam.  Under AP and lateral visualization, the needle was advanced so it did not puncture dura and was located close the 6 O'Clock position of the pedical in AP tracterory. Biplanar projections were used to confirm position. Aspiration was confirmed to be negative for CSF and/or blood. A 1-2 ml. volume of Isovue-250 was injected and flow of contrast was noted at each level. Radiographs were obtained for documentation purposes.   After attaining the desired flow of contrast documented above, a 0.5 to 1.0 ml test dose of 0.25% Marcaine was injected into each respective transforaminal space.  The patient was observed for 90 seconds post injection.  After no sensory  deficits were reported, and normal lower extremity motor function was noted,   the above injectate was administered so that equal amounts of the injectate were placed at each foramen (level) into the transforaminal epidural space.   Additional Comments:  The patient tolerated the procedure well Dressing: 2 x 2 sterile gauze and Band-Aid    Post-procedure details: Patient was observed during the  procedure. Post-procedure instructions were reviewed.  Patient left the clinic in stable condition.      Clinical History: LUMBAR MYELOGRAM  CT LUMBAR MYELOGRAM  FLUOROSCOPY TIME:  Radiation Exposure Index (as provided by the fluoroscopic device): 29.1 mGy  Fluoroscopy Time:  1 minute, 42 seconds  Number of Acquired Images:  17  PROCEDURE: After thorough discussion of risks and benefits of the procedure including bleeding, infection, injury to nerves, blood vessels, adjacent structures as well as headache and CSF leak, written and oral informed consent was obtained. Consent was obtained by Dr. Malachi Pro. Time out form was completed.  Patient was positioned prone on the fluoroscopy table. Local anesthesia was provided with 1% lidocaine without epinephrine after prepped and draped in the usual sterile fashion. Puncture was performed at L2-L3 using a 3 1/2 inch 22-gauge spinal needle via right interlaminar approach. Using a single pass through the dura, the needle was placed within the thecal sac, with return of clear CSF. 15 mL of Isovue M-200 was injected into the thecal sac, with normal opacification of the nerve roots and cauda equina consistent with free flow within the subarachnoid space.  I personally performed the lumbar puncture and administered the intrathecal contrast. I also personally supervised acquisition of the myelogram images.  TECHNIQUE: Contiguous axial images were obtained through the lumbar spine after the intrathecal infusion of contrast. Coronal and sagittal reconstructions were obtained of the axial image sets.  COMPARISON:  CTA abdomen and pelvis dated May 21, 2018.  FINDINGS: LUMBAR MYELOGRAM FINDINGS:  Trace retrolisthesis at L4-L5. No dynamic instability. Small ventral extradural defects from L2-L3 through L5-S1. Underfilling of the right greater than left L5 nerve roots. No spinal canal stenosis.  CT LUMBAR MYELOGRAM  FINDINGS:  Segmentation: Standard.  Alignment: Trace retrolisthesis at L4-L5.  Vertebrae: No acute fracture or other focal pathologic process.  Conus medullaris and cauda equina: Conus extends to the L1 level. Conus and cauda equina appear normal.  Paraspinal and other soft tissues: Unchanged left adrenal gland thickening. Prior EVAR. Stable to slightly decreased infrarenal abdominal aortic aneurysm currently measuring 3.3 x 3.6 cm, previously 3.5 x 3.7 cm.  Disc levels:  T12-L1:  Negative.  L1-L2:  Negative.  L2-L3:  Mild disc bulging.  No stenosis.  L3-L4:  Mild disc bulging.  No stenosis.  L4-L5: Mild-to-moderate disc bulging. Mild bilateral facet arthropathy. Mild to moderate right lateral recess stenosis. Mild left neuroforaminal stenosis. No spinal canal or right neuroforaminal stenosis.  L5-S1: Mild-to-moderate disc bulging. Mild bilateral facet arthropathy. Moderate to severe right and mild left neuroforaminal stenosis. No spinal canal stenosis.  IMPRESSION: 1. Multilevel lumbar spondylosis as described above. Moderate to severe right neuroforaminal stenosis at L5-S1. 2. Mild to moderate right lateral recess stenosis at L4-L5. 3. Stable to slightly decreased size of the infrarenal abdominal aortic aneurysm status post EVAR.   Electronically Signed   By: Obie Dredge M.D.   On: 06/21/2020 11:28     Objective:  VS:  HT:    WT:   BMI:     BP:135/80  HR:61bpm  TEMP: ( )  RESP:  Physical  Exam Vitals and nursing note reviewed.  Constitutional:      General: She is not in acute distress.    Appearance: Normal appearance. She is not ill-appearing.  HENT:     Head: Normocephalic and atraumatic.     Right Ear: External ear normal.     Left Ear: External ear normal.  Eyes:     Extraocular Movements: Extraocular movements intact.  Cardiovascular:     Rate and Rhythm: Normal rate.     Pulses: Normal pulses.  Pulmonary:     Effort:  Pulmonary effort is normal. No respiratory distress.  Abdominal:     General: There is no distension.     Palpations: Abdomen is soft.  Musculoskeletal:        General: Tenderness present.     Cervical back: Neck supple.     Right lower leg: No edema.     Left lower leg: No edema.     Comments: Patient has good distal strength with no pain over the greater trochanters.  No clonus or focal weakness.  Skin:    Findings: No erythema, lesion or rash.  Neurological:     General: No focal deficit present.     Mental Status: She is alert and oriented to person, place, and time.     Sensory: No sensory deficit.     Motor: No weakness or abnormal muscle tone.     Coordination: Coordination normal.  Psychiatric:        Mood and Affect: Mood normal.        Behavior: Behavior normal.      Imaging: No results found.

## 2020-09-13 NOTE — Progress Notes (Signed)
Pt state lower back pain that travels to both hips and moslty down her right leg. Pt state walking, standing and sitting makes the pain worse. Pt state it hard for her to get comfortable at night to sleep. Pt state she deals with the pain.  Numeric Pain Rating Scale and Functional Assessment Average Pain 10   In the last MONTH (on 0-10 scale) has pain interfered with the following?  1. General activity like being  able to carry out your everyday physical activities such as walking, climbing stairs, carrying groceries, or moving a chair?  Rating(10)   +Driver, +BT, -Dye Allergies.

## 2020-09-27 ENCOUNTER — Other Ambulatory Visit: Payer: Self-pay

## 2020-09-27 ENCOUNTER — Ambulatory Visit (INDEPENDENT_AMBULATORY_CARE_PROVIDER_SITE_OTHER): Payer: Medicare Other | Admitting: Orthopaedic Surgery

## 2020-09-27 ENCOUNTER — Encounter: Payer: Self-pay | Admitting: Orthopaedic Surgery

## 2020-09-27 VITALS — Ht 65.5 in | Wt 194.0 lb

## 2020-09-27 DIAGNOSIS — I6523 Occlusion and stenosis of bilateral carotid arteries: Secondary | ICD-10-CM

## 2020-09-27 DIAGNOSIS — M48061 Spinal stenosis, lumbar region without neurogenic claudication: Secondary | ICD-10-CM | POA: Diagnosis not present

## 2020-09-27 NOTE — Progress Notes (Signed)
Office Visit Note   Patient: Heather Livingston           Date of Birth: 01/01/1951           MRN: [id number] Visit Date: 09/27/2020              Requested by: Theodoro Kos, MD [address] [place],  [place] [address] PCP: Theodoro Kos, MD   Assessment & Plan: Visit Diagnoses:  1. Lumbar foraminal stenosis     Plan: Patient will return in 6 weeks we may consider a right L5 foraminal injection at that point were myelogram CT scan showed the most significant compression.  Follow-Up Instructions: Return in about 6 weeks (around 11/08/2020).   Orders:  No orders of the defined types were placed in this encounter.  No orders of the defined types were placed in this encounter.     Procedures: No procedures performed   Clinical Data: No additional findings.   Subjective: Chief Complaint  Patient presents with  . Lower Back - Pain, Follow-up    HPI 69 year old female returns post epidural 09/13/2020.  Patient states is helped with the pain not quite as severe injection was done at L2-3.  She has more right than left leg pain.  Mild CT showed severe foraminal stenosis on the right at L5S1.  No neurogenic claudication symptoms.  She walks in a forward flexed position.  No bowel or bladder associated symptoms.  ABIs have been done last 6 months and look good.  She has had past smoking history coronary artery disease history of SVT.  Review of Systems all other systems are negative is obtained HPI.   Objective: Vital Signs: Ht 5' 5.5" (1.664 m)   Wt 194 lb (88 kg)   BMI 31.79 kg/m   Physical Exam Constitutional:      Appearance: She is well-developed.  HENT:     Head: Normocephalic.     Right Ear: External ear normal.     Left Ear: External ear normal.  Eyes:     Pupils: Pupils are equal, round, and reactive to light.  Neck:     Thyroid: No thyromegaly.     Trachea: No tracheal deviation.  Cardiovascular:     Rate and Rhythm: Normal rate.  Pulmonary:      Effort: Pulmonary effort is normal.  Abdominal:     Palpations: Abdomen is soft.  Skin:    General: Skin is warm and dry.  Neurological:     Mental Status: She is alert and oriented to person, place, and time.  Psychiatric:        Mood and Affect: Mood and affect normal.        Behavior: Behavior normal.     Ortho Exam patient has negative logroll the hips right and left.  Palpable pulse.  Some pain with straight leg raising at 90 degrees.  Anterior tib EHL is strong.  Specialty Comments:  No specialty comments available.  Imaging: No results found.   PMFS History: Patient Active Problem List   Diagnosis Date Noted  . Trochanteric bursitis, right hip 07/05/2020  . Lumbar foraminal stenosis 07/05/2020  . Claudication (HCC) 06/14/2020  . AAA (abdominal aortic aneurysm) (HCC) 05/05/2018  . Cerebral aneurysm, nonruptured 03/18/2018  . Chest pain 01/23/2018  . GERD (gastroesophageal reflux disease) 01/23/2018  . OSA (obstructive sleep apnea) 01/23/2018  . Aneurysm of infrarenal abdominal aorta (HCC) 01/21/2018  . 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 Unstable angina (HCC) 01/19/2018  .  Essential hypertension, benign 10/29/2010  . CORONARY ATHEROSCLEROSIS NATIVE CORONARY ARTERY 10/29/2010  . SUPRAVENTRICULAR TACHYCARDIA 09/13/2010  . Hyperlipemia 08/27/2010  . 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 Palpitations 08/27/2010   Past Medical History:  Diagnosis Date  . Abdominal aortic aneurysm (AAA) (HCC)   . Arthritis   . Bradycardia   . Brain aneurysm   . Cataracts, bilateral   . Coronary atherosclerosis of native coronary artery    DES circ 10/10, residual 100% occluded SVG to OM, patent LIMA to LAD and SVG to PDA grafts, LVEF 50%  . GERD (gastroesophageal reflux disease)   . History of hiatal hernia   . History of stroke    Incidental findings by CT imaging  . Hyperlipemia   . Hypertension   . Low iron   . MI (myocardial infarction) (HCC)   . Nonruptured cerebral  aneurysm   . OSA (obstructive sleep apnea)    On CPAP  . Pneumonia   . Restless legs     Family History  Problem Relation Age of Onset  . Hypertension Mother   . Heart attack Father   . Hypertension Brother     Past Surgical History:  Procedure Laterality Date  . ABDOMINAL AORTIC ENDOVASCULAR STENT GRAFT N/A 05/05/2018   Procedure: ABDOMINAL AORTIC ENDOVASCULAR STENT GRAFT;  Surgeon: Nada Libman, MD;  Location: [facility] OR;  Service: Vascular;  Laterality: N/A;  . ABDOMINAL AORTOGRAM N/A 01/19/2018   Procedure: ABDOMINAL AORTOGRAM;  Surgeon: Corky Crafts, MD;  Location[facility] INVASIVE CV LAB;  Service: Cardiovascular;  Laterality: N/A;  . ABDOMINAL HYSTERECTOMY    . CARPAL TUNNEL RELEASE    . CORONARY ARTERY BYPASS GRAFT  2009   [facility], LIMA to LAD, SVG to OM, SVG to PDA  . ENDOVASCULAR REPAIR/STENT GRAFT  05/05/2018  . IR ANGIO INTRA EXTRACRAN SEL INTERNAL CAROTID BILAT MOD SED  02/23/2018  . IR ANGIO VERTEBRAL SEL SUBCLAVIAN INNOMINATE UNI R MOD SED  04/14/2019  . IR ANGIO VERTEBRAL SEL VERTEBRAL BILAT MOD SED  02/23/2018  . IR ANGIO VERTEBRAL SEL VERTEBRAL UNI L MOD SED  03/18/2018  . IR ANGIOGRAM FOLLOW UP STUDY  03/18/2018  . IR ANGIOGRAM FOLLOW UP STUDY  03/18/2018  . IR ANGIOGRAM FOLLOW UP STUDY  03/18/2018  . IR ANGIOGRAM FOLLOW UP STUDY  03/18/2018  . IR TRANSCATH/EMBOLIZ  03/18/2018  . IR US GUIDE VASC ACCESS RIGHT  04/14/2019  . LEFT HEART CATH AND CORS/GRAFTS ANGIOGRAPHY N/A 01/19/2018   Procedure: LEFT HEART CATH AND CORS/GRAFTS ANGIOGRAPHY;  Surgeon: Corky Crafts, MD;  Location: [facility] INVASIVE CV LAB;  Service: Cardiovascular;  Laterality: N/A;  . LEFT HEART CATHETERIZATION WITH CORONARY/GRAFT ANGIOGRAM N/A 12/27/2012   Procedure: LEFT HEART CATHETERIZATION WITH Isabel Caprice;  Surgeon: Herby Abraham, MD;  Location: [facility] CATH LAB;  Service: Cardiovascular;  Laterality: N/A;  . RADIOLOGY WITH ANESTHESIA N/A 03/18/2018   Procedure: coil embolization of basilar  aneurysm;  Surgeon: Lisbeth Renshaw, MD;  Location: [facility] OR;  Service: Radiology;  Laterality: N/A;   Social History   Occupational History  . Occupation: Disabled  Tobacco Use  . Smoking status: Former Smoker    Packs/day: 0.75    Years: 43.00    Pack years: 32.25    Types: Cigarettes    Start date: 09/13/1965    Quit date: 10/14/2007    Years since quitting: 12.9  . Smokeless tobacco: Never Used  Vaping Use  . Vaping Use: Never used  Substance and Sexual Activity  .  Alcohol use: Yes    Alcohol/week: 0.0 standard drinks    Comment: Occasional wine or beer  . Drug use: No  . Sexual activity: Not on file

## 2020-09-28 ENCOUNTER — Ambulatory Visit: Payer: Medicare Other | Admitting: Orthopaedic Surgery

## 2020-10-01 ENCOUNTER — Other Ambulatory Visit: Payer: Self-pay | Admitting: Cardiology

## 2020-10-15 ENCOUNTER — Telehealth: Payer: Self-pay | Admitting: Cardiology

## 2020-10-15 NOTE — Telephone Encounter (Signed)
Patient called to report recent BP reading 135/68  10/03/2020 by Dr. Caryl Asp

## 2020-10-15 NOTE — Telephone Encounter (Signed)
Patient worried that diastolic was low, I assured her it was not.

## 2020-11-08 ENCOUNTER — Other Ambulatory Visit: Payer: Self-pay

## 2020-11-08 ENCOUNTER — Ambulatory Visit (INDEPENDENT_AMBULATORY_CARE_PROVIDER_SITE_OTHER): Payer: Medicare Other | Admitting: Orthopaedic Surgery

## 2020-11-08 VITALS — Ht 65.5 in | Wt 194.0 lb

## 2020-11-08 DIAGNOSIS — M48061 Spinal stenosis, lumbar region without neurogenic claudication: Secondary | ICD-10-CM | POA: Diagnosis not present

## 2020-11-08 NOTE — Progress Notes (Signed)
Office Visit Note   Patient: Heather Livingston           Date of Birth: 24-Mar-1951           MRN: [id number] Visit Date: 11/08/2020              Requested by: Theodoro Kos, MD [address] [place],  [place] [address] PCP: Theodoro Kos, MD   Assessment & Plan: Visit Diagnoses:  1. Lumbar foraminal stenosis     Plan: Lumbar myelogram CT September 2021 showed multilevel spondylosis with severe right neuroforaminal stenosis L5-S1 on the right. She had moderate narrowing on the right at L4-5. Mild to moderate on the left. We discussed options. At present her symptoms are not severe enough to consider operative intervention. We will recheck her in 6 months.  Follow-Up Instructions: Return in about 6 months (around 05/08/2021).   Orders:  No orders of the defined types were placed in this encounter.  No orders of the defined types were placed in this encounter.     Procedures: No procedures performed   Clinical Data: No additional findings.   Subjective: Chief Complaint  Patient presents with  . Lower Back - Pain, Follow-up    HPI 70 year old female returns with ongoing problems with back pain right greater than left radicular symptoms. She has some numbness feeling like her toes are covered with wax. She has had trochanteric injection that helped actually a little bit more than epidural injection. Ibuprofen did not help Bayer aspirin does not help. Past smoker she has quit. Previous abdominal aortic stenting also coiling of the basilar aneurysm. Cardiac catheterization for CAD. Left lumbar epidural did not give her relief. She continues to go wherever she needs to go she does better leaning on a cart.  Review of Systems all other systems noncontributory other than as mentioned above.   Objective: Vital Signs: Ht 5' 5.5" (1.664 m)   Wt 194 lb (88 kg)   BMI 31.79 kg/m   Physical Exam Constitutional:      Appearance: She is well-developed.  HENT:     Head:  Normocephalic.     Right Ear: External ear normal.     Left Ear: External ear normal.  Eyes:     Pupils: Pupils are equal, round, and reactive to light.  Neck:     Thyroid: No thyromegaly.     Trachea: No tracheal deviation.  Cardiovascular:     Rate and Rhythm: Normal rate.  Pulmonary:     Effort: Pulmonary effort is normal.  Abdominal:     Palpations: Abdomen is soft.  Skin:    General: Skin is warm and dry.  Neurological:     Mental Status: She is alert and oriented to person, place, and time.  Psychiatric:        Mood and Affect: Mood and affect normal.        Behavior: Behavior normal.     Ortho Exam anterior tib gastrocsoleus is active and strong. Normal heel toe gait. Negative popliteal compression test. Negative logroll hips. No rash over exposed skin.  Specialty Comments:  No specialty comments available.  Imaging: No results found.   PMFS History: Patient Active Problem List   Diagnosis Date Noted  . Trochanteric bursitis, right hip 07/05/2020  . Lumbar foraminal stenosis 07/05/2020  . Claudication (HCC) 06/14/2020  . AAA (abdominal aortic aneurysm) (HCC) 05/05/2018  . Cerebral aneurysm, nonruptured 03/18/2018  . Chest pain 01/23/2018  . GERD (gastroesophageal reflux disease)  01/23/2018  . OSA (obstructive sleep apnea) 01/23/2018  . Aneurysm of infrarenal abdominal aorta (HCC) 01/21/2018  . 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 Unstable angina (HCC) 01/19/2018  . Essential hypertension, benign 10/29/2010  . CORONARY ATHEROSCLEROSIS NATIVE CORONARY ARTERY 10/29/2010  . SUPRAVENTRICULAR TACHYCARDIA 09/13/2010  . Hyperlipemia 08/27/2010  . 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 Palpitations 08/27/2010   Past Medical History:  Diagnosis Date  . Abdominal aortic aneurysm (AAA) (HCC)   . Arthritis   . Bradycardia   . Brain aneurysm   . Cataracts, bilateral   . Coronary atherosclerosis of native coronary artery    DES circ 10/10, residual 100% occluded  SVG to OM, patent LIMA to LAD and SVG to PDA grafts, LVEF 50%  . GERD (gastroesophageal reflux disease)   . History of hiatal hernia   . History of stroke    Incidental findings by CT imaging  . Hyperlipemia   . Hypertension   . Low iron   . MI (myocardial infarction) (HCC)   . Nonruptured cerebral aneurysm   . OSA (obstructive sleep apnea)    On CPAP  . Pneumonia   . Restless legs     Family History  Problem Relation Age of Onset  . Hypertension Mother   . Heart attack Father   . Hypertension Brother     Past Surgical History:  Procedure Laterality Date  . ABDOMINAL AORTIC ENDOVASCULAR STENT GRAFT N/A 05/05/2018   Procedure: ABDOMINAL AORTIC ENDOVASCULAR STENT GRAFT;  Surgeon: Nada Libman, MD;  Location: [facility] OR;  Service: Vascular;  Laterality: N/A;  . ABDOMINAL AORTOGRAM N/A 01/19/2018   Procedure: ABDOMINAL AORTOGRAM;  Surgeon: Corky Crafts, MD;  Location: [facility] INVASIVE CV LAB;  Service: Cardiovascular;  Laterality: N/A;  . ABDOMINAL HYSTERECTOMY    . CARPAL TUNNEL RELEASE    . CORONARY ARTERY BYPASS GRAFT  2009   [facility], LIMA to LAD, SVG to OM, SVG to PDA  . ENDOVASCULAR REPAIR/STENT GRAFT  05/05/2018  . IR ANGIO INTRA EXTRACRAN SEL INTERNAL CAROTID BILAT MOD SED  02/23/2018  . IR ANGIO VERTEBRAL SEL SUBCLAVIAN INNOMINATE UNI R MOD SED  04/14/2019  . IR ANGIO VERTEBRAL SEL VERTEBRAL BILAT MOD SED  02/23/2018  . IR ANGIO VERTEBRAL SEL VERTEBRAL UNI L MOD SED  03/18/2018  . IR ANGIOGRAM FOLLOW UP STUDY  03/18/2018  . IR ANGIOGRAM FOLLOW UP STUDY  03/18/2018  . IR ANGIOGRAM FOLLOW UP STUDY  03/18/2018  . IR ANGIOGRAM FOLLOW UP STUDY  03/18/2018  . IR TRANSCATH/EMBOLIZ  03/18/2018  . IR US GUIDE VASC ACCESS RIGHT  04/14/2019  . LEFT HEART CATH AND CORS/GRAFTS ANGIOGRAPHY N/A 01/19/2018   Procedure: LEFT HEART CATH AND CORS/GRAFTS ANGIOGRAPHY;  Surgeon: Corky Crafts, MD;  Location: [facility] INVASIVE CV LAB;  Service: Cardiovascular;  Laterality: N/A;  . LEFT HEART CATHETERIZATION  WITH CORONARY/GRAFT ANGIOGRAM N/A 12/27/2012   Procedure: LEFT HEART CATHETERIZATION WITH Isabel Caprice;  Surgeon: Herby Abraham, MD;  Location: [facility] CATH LAB;  Service: Cardiovascular;  Laterality: N/A;  . RADIOLOGY WITH ANESTHESIA N/A 03/18/2018   Procedure: coil embolization of basilar aneurysm;  Surgeon: Lisbeth Renshaw, MD;  Location: [facility] OR;  Service: Radiology;  Laterality: N/A;   Social History   Occupational History  . Occupation: Disabled  Tobacco Use  . Smoking status: Former Smoker    Packs/day: 0.75    Years: 43.00    Pack years: 32.25    Types: Cigarettes    Start date: 09/13/1965  Quit date: 10/14/2007    Years since quitting: 13.0  . Smokeless tobacco: Never Used  Vaping Use  . Vaping Use: Never used  Substance and Sexual Activity  . Alcohol use: Yes    Alcohol/week: 0.0 standard drinks    Comment: Occasional wine or beer  . Drug use: No  . Sexual activity: Not on file

## 2020-11-19 NOTE — Procedures (Signed)
Lumbosacral Transforaminal Epidural Steroid Injection - Sub-Pedicular Approach with Fluoroscopic Guidance  Patient: Heather Livingston      Date of Birth: 10-18-1950 MRN: [id number] PCP: Theodoro Kos, MD      Visit Date: 09/13/2020   Universal Protocol:    Date/Time: 09/13/2020  Consent Given By: the patient  Position: PRONE  Additional Comments: Vital signs were monitored before and after the procedure. Patient was prepped and draped in the usual sterile fashion. The correct patient, procedure, and site was verified.   Injection Procedure Details:   Procedure diagnoses: Lumbar radiculopathy [M54.16]    Meds Administered:  Meds ordered this encounter  Medications  . dexamethasone (DECADRON) injection 15 mg    Laterality: Right  Location/Site:  L4-L5 L5-S1  Needle:5.0 in., 22 ga.  Short bevel or Quincke spinal needle  Needle Placement: Transforaminal  Findings:    -Comments: Excellent flow of contrast along the nerve, nerve root and into the epidural space.  Procedure Details: After squaring off the end-plates to get a true AP view, the C-arm was positioned so that an oblique view of the foramen as noted above was visualized. The target area is just inferior to the "nose of the scotty dog" or sub pedicular. The soft tissues overlying this structure were infiltrated with 2-3 ml. of 1% Lidocaine without Epinephrine.  The spinal needle was inserted toward the target using a "trajectory" view along the fluoroscope beam.  Under AP and lateral visualization, the needle was advanced so it did not puncture dura and was located close the 6 O'Clock position of the pedical in AP tracterory. Biplanar projections were used to confirm position. Aspiration was confirmed to be negative for CSF and/or blood. A 1-2 ml. volume of Isovue-250 was injected and flow of contrast was noted at each level. Radiographs were obtained for documentation purposes.   After attaining the desired flow of  contrast documented above, a 0.5 to 1.0 ml test dose of 0.25% Marcaine was injected into each respective transforaminal space.  The patient was observed for 90 seconds post injection.  After no sensory deficits were reported, and normal lower extremity motor function was noted,   the above injectate was administered so that equal amounts of the injectate were placed at each foramen (level) into the transforaminal epidural space.   Additional Comments:  The patient tolerated the procedure well Dressing: 2 x 2 sterile gauze and Band-Aid    Post-procedure details: Patient was observed during the procedure. Post-procedure instructions were reviewed.  Patient left the clinic in stable condition.

## 2021-01-27 ENCOUNTER — Other Ambulatory Visit: Payer: Self-pay | Admitting: Cardiology

## 2021-01-30 NOTE — Progress Notes (Signed)
Cardiology Office Note  Date: 01/31/2021   ID: Heather, Livingston 1950/12/26, MRN [id number]  PCP:  Theodoro Kos, MD  Cardiologist:  Nona Dell, MD Electrophysiologist:  None   Chief Complaint  Patient presents with  . Cardiac follow-up    History of Present Illness: Heather Livingston is a 70 y.o. female last seen in October 2021.  She presents for a follow-up visit.  She does not report any progressive angina symptoms, but has felt a vague sense of palpitations and increased shortness of breath with activity over the last for 5 months.  She reports compliance with her medications.  I reviewed her lab work from December 2021 as outlined below.  She was seen by [facility] in July 2021.  Doppler studies at that time showed mild bilateral ICA stenosis at 1 to 39%.  ABIs were normal bilaterally.  Abdominal ultrasound showed patent EVAR without endoleak.  I reviewed her ECG today which shows newly documented atrial fibrillation, old inferior infarct pattern and PVC, nonspecific ST-T changes.  We discussed this today.  CHA2DS2-VASc score is 7.  She has been on dual antiplatelet therapy with history of diffuse vascular disease.  She has a history of a basilar artery aneurysm that underwent successful coiling with no residual aneurysm by follow-up angiogram in 2020.  I talked with her about DOAC for stroke prophylaxis.  Past Medical History:  Diagnosis Date  . Abdominal aortic aneurysm (AAA) (HCC)   . Arthritis   . Bradycardia   . Brain aneurysm   . Cataracts, bilateral   . Coronary atherosclerosis of native coronary artery    DES circ 10/10, residual 100% occluded SVG to OM, patent LIMA to LAD and SVG to PDA grafts, LVEF 50%  . GERD (gastroesophageal reflux disease)   . History of hiatal hernia   . History of stroke    Incidental findings by CT imaging  . Hyperlipemia   . Hypertension   . Low iron   . MI (myocardial infarction) (HCC)   . Nonruptured cerebral aneurysm   . OSA  (obstructive sleep apnea)    On CPAP  . Pneumonia   . Restless legs     Past Surgical History:  Procedure Laterality Date  . ABDOMINAL AORTIC ENDOVASCULAR STENT GRAFT N/A 05/05/2018   Procedure: ABDOMINAL AORTIC ENDOVASCULAR STENT GRAFT;  Surgeon: Nada Libman, MD;  Location: [facility] OR;  Service: Vascular;  Laterality: N/A;  . ABDOMINAL AORTOGRAM N/A 01/19/2018   Procedure: ABDOMINAL AORTOGRAM;  Surgeon: Corky Crafts, MD;  Location: [facility] INVASIVE CV LAB;  Service: Cardiovascular;  Laterality: N/A;  . ABDOMINAL HYSTERECTOMY    . CARPAL TUNNEL RELEASE    . CORONARY ARTERY BYPASS GRAFT  2009   [facility], LIMA to LAD, SVG to OM, SVG to PDA  . ENDOVASCULAR REPAIR/STENT GRAFT  05/05/2018  . IR ANGIO INTRA EXTRACRAN SEL INTERNAL CAROTID BILAT MOD SED  02/23/2018  . IR ANGIO VERTEBRAL SEL SUBCLAVIAN INNOMINATE UNI R MOD SED  04/14/2019  . IR ANGIO VERTEBRAL SEL VERTEBRAL BILAT MOD SED  02/23/2018  . IR ANGIO VERTEBRAL SEL VERTEBRAL UNI L MOD SED  03/18/2018  . IR ANGIOGRAM FOLLOW UP STUDY  03/18/2018  . IR ANGIOGRAM FOLLOW UP STUDY  03/18/2018  . IR ANGIOGRAM FOLLOW UP STUDY  03/18/2018  . IR ANGIOGRAM FOLLOW UP STUDY  03/18/2018  . IR TRANSCATH/EMBOLIZ  03/18/2018  . IR US GUIDE VASC ACCESS RIGHT  04/14/2019  . LEFT HEART CATH AND CORS/GRAFTS  ANGIOGRAPHY N/A 01/19/2018   Procedure: LEFT HEART CATH AND CORS/GRAFTS ANGIOGRAPHY;  Surgeon: Corky Crafts, MD;  Location: [facility] INVASIVE CV LAB;  Service: Cardiovascular;  Laterality: N/A;  . LEFT HEART CATHETERIZATION WITH CORONARY/GRAFT ANGIOGRAM N/A 12/27/2012   Procedure: LEFT HEART CATHETERIZATION WITH Isabel Caprice;  Surgeon: Herby Abraham, MD;  Location: [facility] CATH LAB;  Service: Cardiovascular;  Laterality: N/A;  . RADIOLOGY WITH ANESTHESIA N/A 03/18/2018   Procedure: coil embolization of basilar aneurysm;  Surgeon: Lisbeth Renshaw, MD;  [facility];  Service: Radiology;  Laterality: N/A;    Current Outpatient Medications  Medication  Sig Dispense Refill  . albuterol (VENTOLIN HFA) 108 (90 Base) MCG/ACT inhaler Inhale 1-2 puffs into the lungs every 6 (six) hours as needed for shortness of breath.     Marland Kitchen amLODipine (NORVASC) 5 MG tablet TAKE 1 TABLET BY MOUTH EVERY DAY 90 tablet 3  . aspirin EC 81 MG tablet Take 1 tablet (81 mg total) by mouth daily.    . clopidogrel (PLAVIX) 75 MG tablet TAKE 1 TABLET BY MOUTH EVERY DAY 90 tablet 1  . fluticasone (FLONASE) 50 MCG/ACT nasal spray Place 2 sprays into both nostrils at bedtime.    . hydrALAZINE (APRESOLINE) 100 MG tablet TAKE 1 TABLET BY MOUTH THREE TIMES A DAY 270 tablet 3  . hydrochlorothiazide (HYDRODIURIL) 12.5 MG tablet TAKE 2 TABLETS BY MOUTH EVERY DAY 180 tablet 1  . isosorbide mononitrate (IMDUR) 60 MG 24 hr tablet TAKE 1 TABLET BY MOUTH EVERY DAY 90 tablet 3  . losartan (COZAAR) 100 MG tablet TAKE 1 TABLET BY MOUTH EVERY DAY 90 tablet 1  . magnesium oxide (MAG-OX) 400 (241.3 Mg) MG tablet Take 400 mg by mouth daily.   3  . montelukast (SINGULAIR) 10 MG tablet Take 10 mg by mouth daily.    . nitroGLYCERIN (NITROSTAT) 0.4 MG SL tablet Place 1 tablet (0.4 mg total) under the tongue every 5 (five) minutes x 3 doses as needed for chest pain (if no relief after 3rd dose, proceed to the ED for an evaluation or call 911). 25 tablet 3  . simvastatin (ZOCOR) 40 MG tablet TAKE 1 TABLET BY MOUTH EVERYDAY AT BEDTIME 90 tablet 1  . spironolactone (ALDACTONE) 25 MG tablet TAKE 1/2 TABLET BY MOUTH EVERY DAY 45 tablet 3  . metoprolol tartrate (LOPRESSOR) 25 MG tablet Take 1.5 tablets (37.5 mg total) by mouth 2 (two) times daily. 270 tablet 1   No current facility-administered medications for this visit.   Allergies:  Patient has no known allergies.   ROS: No sudden dizziness or syncope.  Chronic arthritic pains.  Physical Exam: VS:  BP 112/72   Pulse 79   Ht 5' 5.5" (1.664 m)   Wt 215 lb (97.5 kg)   SpO2 96%   BMI 35.23 kg/m , BMI Body mass index is 35.23 kg/m.  Wt Readings  from Last 3 Encounters:  01/31/21 215 lb (97.5 kg)  11/08/20 194 lb (88 kg)  09/27/20 194 lb (88 kg)    General: Patient appears comfortable at rest. HEENT: Conjunctiva and lids normal, wearing a mask. Neck: Supple, no elevated JVP or carotid bruits, no thyromegaly. Lungs: Clear to auscultation, nonlabored breathing at rest. Cardiac: Irregularly irregular, no S3 or significant systolic murmur, no pericardial rub. Abdomen: Soft, nontender, bowel sounds present. Extremities: No pitting edema.  ECG:  An ECG dated 01/30/2020 was personally reviewed today and demonstrated:  Sinus rhythm with PVCs, diffuse ST-T wave abnormalities, possible old inferior  infarct pattern.  Recent Labwork:  December 2021: Cholesterol 108, HDL 30, LDL 47, triglycerides 154, hemoglobin 16.1, platelets 245, BUN 15, creatinine 0.77, potassium 4.4, AST 20, ALT 18, TSH 2.27, hemoglobin A1c 5.5%  Other Studies Reviewed Today:  Cardiac catheterization 01/19/2018:  Prox RCA lesion is 100% stenosed. SVG to PDA is patent.  Prox LAD lesion is 100% stenosed. LIMA to LAD is patent.  Mid LM to Dist LM lesion is 25% stenosed.  Mid Cx lesion is 50% stenosed. SVG to OM is occluded, known from prior.  The left ventricular systolic function is normal.  LV end diastolic pressure is mildly elevated.  The left ventricular ejection fraction is 50-55% by visual estimate.  There is no aortic valve stenosis.  Infrarenal AAA. PAD including right common iliac artery stenosis.  Bilateral common iliac arteries appear ectatic.  Medical therapy for CAD. Stable from prior cath. No acute lesion detected.  Assessment and Plan:  1.  Newly documented atrial fibrillation, persistent and potentially present for the last few months at least based on discussion today.  CHA2DS2-VASc score is 7.  We discussed initiation of DOAC for stroke prophylaxis.  Check CBC and BMET.  Anticipate stopping Plavix and continuing low-dose aspirin with  initiation of Eliquis.  Also increase Lopressor to 37.5 mg twice daily.  2.  Multivessel CAD status post CABG and DES to the circumflex.  No progressive angina symptoms at this time.  Continue Zocor, Lopressor, Cozaar, and as needed nitroglycerin.  3.  Essential hypertension, blood pressure very well controlled today.  States that it is typically higher.  I have asked her to check home blood pressures to see if we need to make any adjustments given up titration of beta-blocker for atrial fibrillation.  4.  Mixed hyperlipidemia, on Zocor with last LDL 47.  Medication Adjustments/Labs and Tests Ordered: Current medicines are reviewed at length with the patient today.  Concerns regarding medicines are outlined above.   Tests Ordered: Orders Placed This Encounter  Procedures  . Basic metabolic panel  . CBC  . EKG 12-Lead    Medication Changes: Meds ordered this encounter  Medications  . metoprolol tartrate (LOPRESSOR) 25 MG tablet    Sig: Take 1.5 tablets (37.5 mg total) by mouth 2 (two) times daily.    Dispense:  270 tablet    Refill:  1    01/31/2021 dose increase    Disposition:  Follow up 3 months.  Signed, Jonelle Sidle, MD, [facility] 01/31/2021 9:07 AM    [facility] at [facility] [address] [place], [place], [place] [address] Phone: [phone]; Fax: [phone]

## 2021-01-31 ENCOUNTER — Ambulatory Visit (INDEPENDENT_AMBULATORY_CARE_PROVIDER_SITE_OTHER): Payer: Medicare Other | Admitting: Cardiology

## 2021-01-31 ENCOUNTER — Other Ambulatory Visit: Payer: Self-pay

## 2021-01-31 ENCOUNTER — Encounter: Payer: Self-pay | Admitting: Cardiology

## 2021-01-31 VITALS — BP 112/72 | HR 79 | Ht 65.5 in | Wt 215.0 lb

## 2021-01-31 DIAGNOSIS — I25119 Atherosclerotic heart disease of native coronary artery with unspecified angina pectoris: Secondary | ICD-10-CM | POA: Diagnosis not present

## 2021-01-31 DIAGNOSIS — I10 Essential (primary) hypertension: Secondary | ICD-10-CM

## 2021-01-31 DIAGNOSIS — I4819 Other persistent atrial fibrillation: Secondary | ICD-10-CM | POA: Diagnosis not present

## 2021-01-31 DIAGNOSIS — E782 Mixed hyperlipidemia: Secondary | ICD-10-CM | POA: Diagnosis not present

## 2021-01-31 DIAGNOSIS — Z79899 Other long term (current) drug therapy: Secondary | ICD-10-CM | POA: Diagnosis not present

## 2021-01-31 MED ORDER — METOPROLOL TARTRATE 25 MG PO TABS: 37.5000 mg | ORAL_TABLET | Freq: Two times a day (BID) | ORAL | 1 refills | Status: DC

## 2021-01-31 NOTE — Patient Instructions (Addendum)
Medication Instructions:   Your physician has recommended you make the following change in your medication:   Increase metoprolol tartrate to 37.5 mg by mouth twice daily  Continue other medications the same  Labwork:  Your physician recommends that you return for lab work in: Today to check your BMET & CBC.  Testing/Procedures:  none  Follow-Up:  Your physician recommends that you schedule a follow-up appointment in: 3 months.  Any Other Special Instructions Will Be Listed Below (If Applicable).  If you need a refill on your cardiac medications before your next appointment, please call your pharmacy.

## 2021-02-01 ENCOUNTER — Telehealth: Payer: Self-pay | Admitting: *Deleted

## 2021-02-01 NOTE — Telephone Encounter (Signed)
-----   Message from Jonelle Sidle, MD sent at 01/31/2021  3:08 PM EDT ----- Results reviewed.  Please let her know that renal function and hemoglobin are normal.  Based on our discussion this morning, would have her stop Plavix and replace it with Eliquis 5 mg twice daily.  She can stay on low-dose aspirin for now.

## 2021-02-05 MED ORDER — ELIQUIS 5 MG PO TABS: 5.0000 mg | ORAL_TABLET | Freq: Two times a day (BID) | ORAL | 6 refills | Status: DC

## 2021-02-05 NOTE — Telephone Encounter (Signed)
Patient informed and verbalized understanding of plan. Has 30 day free eliquis voucher

## 2021-02-25 ENCOUNTER — Telehealth: Payer: Self-pay | Admitting: Cardiology

## 2021-02-25 NOTE — Telephone Encounter (Signed)
Advised that she should be taking eliquis 5 mg twice daily. Verbalized understanding.

## 2021-02-25 NOTE — Telephone Encounter (Signed)
New message    Patient has questions on her Eliquis - is she to continue taking 5mg  2x a day or is she suppose to increase it

## 2021-03-16 ENCOUNTER — Other Ambulatory Visit: Payer: Self-pay | Admitting: Cardiology

## 2021-03-29 ENCOUNTER — Other Ambulatory Visit: Payer: Self-pay | Admitting: Cardiology

## 2021-04-25 ENCOUNTER — Other Ambulatory Visit: Payer: Self-pay | Admitting: Cardiology

## 2021-05-08 ENCOUNTER — Ambulatory Visit: Payer: Medicare Other | Admitting: Cardiology

## 2021-05-08 NOTE — Progress Notes (Deleted)
Cardiology Office Note  Date: 05/08/2021   ID: Heather Livingston, Heather Livingston [birth year], MRN [id number]  PCP:  Theodoro Kos, MD  Cardiologist:  Nona Dell, MD Electrophysiologist:  None   Chief Complaint: 3 month follow up  History of Present Illness: Heather Livingston is a 70 y.o. female with a history of CAD, HTN, abdominal aortic aneurysm, bradycardia, HTN, HLD, OSA, history of CVA.  She was last seen by Dr. Diona Browner on 01/31/2021.  She did not report any anginal symptoms.  She had a very weak sense of palpitations and increased shortness of breath with activity over the prior 5 months.  She had been seen by vein and vascular in July 2021.  Carotid Doppler showed mild bilateral ICA stenosis of 1 to 39%.  ABIs were normal bilaterally.  Abdominal ultrasound showed patent EVAR without endoleak.  She had newly documented atrial fibrillation on EKG with old inferior infarct pattern and PVC, nonspecific ST/T changes.  CHA2DS2-VASc score was 7.  She had been on dual antiplatelet therapy with history of diffuse vascular disease.  History of basilar artery aneurysm with successful: With no residual follow-up angio 2022.  Dr. Diona Browner spoke with her about DOAC for stroke prophylaxis.  CBC and basic metabolic panel were ordered.  Plans were to stop Plavix and low-dose aspirin with initiation of Eliquis.  Lopressor was increased to 37.5 mg p.o. twice daily.  She was continuing Zocor, Lopressor, Cozaar and as needed nitroglycerin.  Her blood pressure was well controlled on that visit.  She was asked to check home blood pressures to see if any adjustments need to be made given up titration of beta-blocker for atrial fibrillation.  Past Medical History:  Diagnosis Date   Abdominal aortic aneurysm (AAA) (HCC)    Arthritis    Bradycardia    Brain aneurysm    Cataracts, bilateral    Coronary atherosclerosis of native coronary artery    DES circ 10/10, residual 100% occluded SVG to OM, patent LIMA to LAD and  SVG to PDA grafts, LVEF 50%   GERD (gastroesophageal reflux disease)    History of hiatal hernia    History of stroke    Incidental findings by CT imaging   Hyperlipemia    Hypertension    Low iron    MI (myocardial infarction) (HCC)    Nonruptured cerebral aneurysm    OSA (obstructive sleep apnea)    On CPAP   Pneumonia    Restless legs     Past Surgical History:  Procedure Laterality Date   ABDOMINAL AORTIC ENDOVASCULAR STENT GRAFT N/A 05/05/2018   Procedure: ABDOMINAL AORTIC ENDOVASCULAR STENT GRAFT;  Surgeon: Nada Libman, MD;  Location: [facility] OR;  Service: Vascular;  Laterality: N/A;   ABDOMINAL AORTOGRAM N/A 01/19/2018   Procedure: ABDOMINAL AORTOGRAM;  Surgeon: Corky Crafts, MD;  Location: [facility] INVASIVE CV LAB;  Service: Cardiovascular;  Laterality: N/A;   ABDOMINAL HYSTERECTOMY     CARPAL TUNNEL RELEASE     CORONARY ARTERY BYPASS GRAFT  2009   [facility], LIMA to LAD, SVG to OM, SVG to PDA   ENDOVASCULAR REPAIR/STENT GRAFT  05/05/2018   IR ANGIO INTRA EXTRACRAN SEL INTERNAL CAROTID BILAT MOD SED  02/23/2018   IR ANGIO VERTEBRAL SEL SUBCLAVIAN INNOMINATE UNI R MOD SED  04/14/2019   IR ANGIO VERTEBRAL SEL VERTEBRAL BILAT MOD SED  02/23/2018   IR ANGIO VERTEBRAL SEL VERTEBRAL UNI L MOD SED  03/18/2018   IR ANGIOGRAM FOLLOW UP STUDY  03/18/2018   IR ANGIOGRAM FOLLOW UP STUDY  03/18/2018   IR ANGIOGRAM FOLLOW UP STUDY  03/18/2018   IR ANGIOGRAM FOLLOW UP STUDY  03/18/2018   IR TRANSCATH/EMBOLIZ  03/18/2018   IR US GUIDE VASC ACCESS RIGHT  04/14/2019   LEFT HEART CATH AND CORS/GRAFTS ANGIOGRAPHY N/A 01/19/2018   Procedure: LEFT HEART CATH AND CORS/GRAFTS ANGIOGRAPHY;  Surgeon: Corky Crafts, MD;  Location: [facility] INVASIVE CV LAB;  Service: Cardiovascular;  Laterality: N/A;   LEFT HEART CATHETERIZATION WITH CORONARY/GRAFT ANGIOGRAM N/A 12/27/2012   Procedure: LEFT HEART CATHETERIZATION WITH Isabel Caprice;  Surgeon: Herby Abraham, MD;  Location: [facility] CATH LAB;  Service:  Cardiovascular;  Laterality: N/A;   RADIOLOGY WITH ANESTHESIA N/A 03/18/2018   Procedure: coil embolization of basilar aneurysm;  Surgeon: Lisbeth Renshaw, MD;  Location: [facility] OR;  Service: Radiology;  Laterality: N/A;    Current Outpatient Medications  Medication Sig Dispense Refill   hydrochlorothiazide (HYDRODIURIL) 12.5 MG tablet TAKE 2 TABLETS BY MOUTH EVERY DAY 180 tablet 3   simvastatin (ZOCOR) 40 MG tablet TAKE 1 TABLET BY MOUTH EVERYDAY AT BEDTIME 90 tablet 3   albuterol (VENTOLIN HFA) 108 (90 Base) MCG/ACT inhaler Inhale 1-2 puffs into the lungs every 6 (six) hours as needed for shortness of breath.      amLODipine (NORVASC) 5 MG tablet TAKE 1 TABLET BY MOUTH EVERY DAY 90 tablet 3   apixaban (ELIQUIS) 5 MG TABS tablet Take 1 tablet (5 mg total) by mouth 2 (two) times daily. 60 tablet 6   aspirin EC 81 MG tablet Take 1 tablet (81 mg total) by mouth daily.     fluticasone (FLONASE) 50 MCG/ACT nasal spray Place 2 sprays into both nostrils at bedtime.     hydrALAZINE (APRESOLINE) 100 MG tablet TAKE 1 TABLET BY MOUTH THREE TIMES A DAY 270 tablet 3   isosorbide mononitrate (IMDUR) 60 MG 24 hr tablet TAKE 1 TABLET BY MOUTH EVERY DAY 90 tablet 3   losartan (COZAAR) 100 MG tablet TAKE 1 TABLET BY MOUTH EVERY DAY 90 tablet 1   magnesium oxide (MAG-OX) 400 (241.3 Mg) MG tablet Take 400 mg by mouth daily.   3   metoprolol tartrate (LOPRESSOR) 25 MG tablet Take 1.5 tablets (37.5 mg total) by mouth 2 (two) times daily. 270 tablet 1   montelukast (SINGULAIR) 10 MG tablet Take 10 mg by mouth daily.     nitroGLYCERIN (NITROSTAT) 0.4 MG SL tablet Place 1 tablet (0.4 mg total) under the tongue every 5 (five) minutes x 3 doses as needed for chest pain (if no relief after 3rd dose, proceed to the ED for an evaluation or call 911). 25 tablet 3   spironolactone (ALDACTONE) 25 MG tablet TAKE 1/2 TABLET BY MOUTH EVERY DAY 45 tablet 3   No current facility-administered medications for this visit.    Allergies:  Patient has no known allergies.   Social History: The patient  reports that she quit smoking about 13 years ago. Her smoking use included cigarettes. She started smoking about 55 years ago. She has a 32.25 pack-year smoking history. She has never used smokeless tobacco. She reports current alcohol use. She reports that she does not use drugs.   Family History: The patient's family history includes Heart attack in her father; Hypertension in her brother and mother.   ROS:  Please see the history of present illness. Otherwise, complete review of systems is positive for {NONE DEFAULTED:18576}.  All other systems are reviewed and negative.  Physical Exam: VS:  There were no vitals taken for this visit., BMI There is no height or weight on file to calculate BMI.  Wt Readings from Last 3 Encounters:  01/31/21 215 lb (97.5 kg)  11/08/20 194 lb (88 kg)  09/27/20 194 lb (88 kg)    General: Patient appears comfortable at rest. HEENT: Conjunctiva and lids normal, oropharynx clear with moist mucosa. Neck: Supple, no elevated JVP or carotid bruits, no thyromegaly. Lungs: Clear to auscultation, nonlabored breathing at rest. Cardiac: Regular rate and rhythm, no S3 or significant systolic murmur, no pericardial rub. Abdomen: Soft, nontender, no hepatomegaly, bowel sounds present, no guarding or rebound. Extremities: No pitting edema, distal pulses 2+. Skin: Warm and dry. Musculoskeletal: No kyphosis. Neuropsychiatric: Alert and oriented x3, affect grossly appropriate.  ECG:  {EKG/Telemetry Strips Reviewed:[phone]}  Recent Labwork: No results found for requested labs within last 8760 hours.     Component Value Date/Time   CHOL 85 01/20/2018 0206   TRIG 162 (H) 01/20/2018 0206   HDL 25 (L) 01/20/2018 0206   CHOLHDL 3.4 01/20/2018 0206   VLDL 32 01/20/2018 0206   LDLCALC 28 01/20/2018 0206    Other Studies Reviewed Today:   Cardiac catheterization 01/19/2018: Prox RCA  lesion is 100% stenosed. SVG to PDA is patent. Prox LAD lesion is 100% stenosed. LIMA to LAD is patent. Mid LM to Dist LM lesion is 25% stenosed. Mid Cx lesion is 50% stenosed. SVG to OM is occluded, known from prior. The left ventricular systolic function is normal. LV end diastolic pressure is mildly elevated. The left ventricular ejection fraction is 50-55% by visual estimate. There is no aortic valve stenosis. Infrarenal AAA. PAD including right common iliac artery stenosis. Bilateral common iliac arteries appear ectatic.   Medical therapy for CAD.  Stable from prior cath. No acute lesion detected  Assessment and Plan:  1. Atrial fibrillation, unspecified type (HCC)   2. CAD in native artery   3. Essential hypertension   4. Mixed hyperlipidemia    1. Atrial fibrillation, unspecified type (HCC) ***  2. CAD in native artery ***  3. Essential hypertension ***  4. Mixed hyperlipidemia ***   Medication Adjustments/Labs and Tests Ordered: Current medicines are reviewed at length with the patient today.  Concerns regarding medicines are outlined above.   Disposition: Follow-up with ***  Signed, Rennis Harding, NP 05/08/2021 8:26 PM    [facility] at [facility] [address] [place], [place], [place] [address] Phone: [phone]; Fax: [phone]

## 2021-05-09 ENCOUNTER — Other Ambulatory Visit: Payer: Self-pay

## 2021-05-09 ENCOUNTER — Ambulatory Visit: Payer: Medicare Other | Admitting: Orthopaedic Surgery

## 2021-05-09 ENCOUNTER — Ambulatory Visit: Payer: Medicare Other | Admitting: Family Medicine

## 2021-05-09 DIAGNOSIS — I4891 Unspecified atrial fibrillation: Secondary | ICD-10-CM

## 2021-05-09 DIAGNOSIS — E782 Mixed hyperlipidemia: Secondary | ICD-10-CM

## 2021-05-09 DIAGNOSIS — I2510 Atherosclerotic heart disease of native coronary artery without angina pectoris: Secondary | ICD-10-CM

## 2021-05-09 DIAGNOSIS — I10 Essential (primary) hypertension: Secondary | ICD-10-CM

## 2021-05-10 ENCOUNTER — Other Ambulatory Visit: Payer: Self-pay

## 2021-05-10 DIAGNOSIS — I714 Abdominal aortic aneurysm, without rupture, unspecified: Secondary | ICD-10-CM

## 2021-05-10 DIAGNOSIS — I739 Peripheral vascular disease, unspecified: Secondary | ICD-10-CM

## 2021-05-20 ENCOUNTER — Ambulatory Visit (INDEPENDENT_AMBULATORY_CARE_PROVIDER_SITE_OTHER): Payer: Medicare Other | Admitting: Surgery

## 2021-05-20 ENCOUNTER — Ambulatory Visit (INDEPENDENT_AMBULATORY_CARE_PROVIDER_SITE_OTHER)
Admission: RE | Admit: 2021-05-20 | Discharge: 2021-05-20 | Disposition: A | Discharge status: Home / Self Care | Payer: Medicare Other | Source: Ambulatory Visit | Attending: Surgery | Admitting: Surgery

## 2021-05-20 ENCOUNTER — Encounter: Payer: Self-pay | Admitting: Surgery

## 2021-05-20 ENCOUNTER — Ambulatory Visit (HOSPITAL_COMMUNITY)
Admission: RE | Admit: 2021-05-20 | Discharge: 2021-05-20 | Disposition: A | Discharge status: Home / Self Care | Payer: Medicare Other | Source: Ambulatory Visit | Attending: Surgery | Admitting: Surgery

## 2021-05-20 ENCOUNTER — Other Ambulatory Visit: Payer: Self-pay

## 2021-05-20 VITALS — BP 106/70 | HR 77 | Temp 97.7°F | Resp 20 | Ht 65.5 in | Wt 210.0 lb

## 2021-05-20 DIAGNOSIS — I714 Abdominal aortic aneurysm, without rupture, unspecified: Secondary | ICD-10-CM

## 2021-05-20 DIAGNOSIS — I70211 Atherosclerosis of native arteries of extremities with intermittent claudication, right leg: Secondary | ICD-10-CM | POA: Diagnosis not present

## 2021-05-20 DIAGNOSIS — I739 Peripheral vascular disease, unspecified: Secondary | ICD-10-CM | POA: Insufficient documentation

## 2021-05-20 NOTE — Progress Notes (Signed)
[facility] [place]  Patient name: Heather Livingston MRN: [id number] DOB: [birth year] Sex: female   REASON FOR VISIT:    Follow up  HISOTRY OF PRESENT ILLNESS:    [name] is a 70 y.o. female who is status post endovascular aneurysm repair performed on 05/05/2018.  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  Prior to surgery she was having severe right hip and leg pain with minimal activity.  Her aneurysm measured 3.6 cm.  I felt that treating her aneurysm simultaneously would prevent future challenges with endovascular repair.   The patient is medically managed for hypertension.  She takes a statin for hypercholesterolemia.  PAST MEDICAL HISTORY:   Past Medical History:  Diagnosis Date   Abdominal aortic aneurysm (AAA) (HCC)    Arthritis    Bradycardia    Brain aneurysm    Cataracts, bilateral    Coronary atherosclerosis of native coronary artery    DES circ 10/10, residual 100% occluded SVG to OM, patent LIMA to LAD and SVG to PDA grafts, LVEF 50%   GERD (gastroesophageal reflux disease)    History of hiatal hernia    History of stroke    Incidental findings by CT imaging   Hyperlipemia    Hypertension    Low iron    MI (myocardial infarction) (HCC)    Nonruptured cerebral aneurysm    OSA (obstructive sleep apnea)    On CPAP   Pneumonia    Restless legs      FAMILY HISTORY:   Family History  Problem Relation Age of Onset   Hypertension Mother    Heart attack Father    Hypertension Brother     SOCIAL HISTORY:   Social History   Tobacco Use   Smoking status: Former    Packs/day: 0.75    Years: 43.00    Pack years: 32.25    Types: Cigarettes    Start date: 09/13/1965    Quit date: 10/14/2007    Years since quitting: 13.6   Smokeless tobacco: Never  Substance Use Topics   Alcohol use: Yes    Alcohol/week: 0.0 standard drinks    Comment: Occasional  wine or beer     ALLERGIES:   No Known Allergies   CURRENT MEDICATIONS:   Current Outpatient Medications  Medication Sig Dispense Refill   hydrochlorothiazide (HYDRODIURIL) 12.5 MG tablet TAKE 2 TABLETS BY MOUTH EVERY DAY 180 tablet 3   simvastatin (ZOCOR) 40 MG tablet TAKE 1 TABLET BY MOUTH EVERYDAY AT BEDTIME 90 tablet 3   albuterol (VENTOLIN HFA) 108 (90 Base) MCG/ACT inhaler Inhale 1-2 puffs into the lungs every 6 (six) hours as needed for shortness of breath.      amLODipine (NORVASC) 5 MG tablet TAKE 1 TABLET BY MOUTH EVERY DAY 90 tablet 3   apixaban (ELIQUIS) 5 MG TABS tablet Take 1 tablet (5 mg total) by mouth 2 (two) times daily. 60 tablet 6   aspirin EC 81 MG tablet Take 1 tablet (81 mg total) by mouth daily.     fluticasone (FLONASE) 50 MCG/ACT nasal spray Place 2 sprays into both nostrils at bedtime.     hydrALAZINE (APRESOLINE) 100 MG tablet TAKE 1 TABLET BY MOUTH THREE TIMES A DAY 270 tablet 3   isosorbide mononitrate (IMDUR) 60 MG 24 hr tablet TAKE 1 TABLET BY MOUTH EVERY DAY 90 tablet 3   losartan (COZAAR) 100 MG tablet TAKE 1 TABLET BY MOUTH  EVERY DAY 90 tablet 1   magnesium oxide (MAG-OX) 400 (241.3 Mg) MG tablet Take 400 mg by mouth daily.   3   metoprolol tartrate (LOPRESSOR) 25 MG tablet Take 1.5 tablets (37.5 mg total) by mouth 2 (two) times daily. 270 tablet 1   montelukast (SINGULAIR) 10 MG tablet Take 10 mg by mouth daily.     nitroGLYCERIN (NITROSTAT) 0.4 MG SL tablet Place 1 tablet (0.4 mg total) under the tongue every 5 (five) minutes x 3 doses as needed for chest pain (if no relief after 3rd dose, proceed to the ED for an evaluation or call 911). 25 tablet 3   spironolactone (ALDACTONE) 25 MG tablet TAKE 1/2 TABLET BY MOUTH EVERY DAY 45 tablet 3   No current facility-administered medications for this visit.    REVIEW OF SYSTEMS:   [X]  denotes positive finding, [ ]  denotes negative finding Cardiac  Comments:  Chest pain or chest pressure:    Shortness  of breath upon exertion:    Short of breath when lying flat:    Irregular heart rhythm:        Vascular    Pain in calf, thigh, or hip brought on by ambulation:    Pain in feet at night that wakes you up from your sleep:     Blood clot in your veins:    Leg swelling:         Pulmonary    Oxygen at home:    Productive cough:     Wheezing:         Neurologic    Sudden weakness in arms or legs:     Sudden numbness in arms or legs:     Sudden onset of difficulty speaking or slurred speech:    Temporary loss of vision in one eye:     Problems with dizziness:         Gastrointestinal    Blood in stool:     Vomited blood:         Genitourinary    Burning when urinating:     Blood in urine:        Psychiatric    Major depression:         Hematologic    Bleeding problems:    Problems with blood clotting too easily:        Skin    Rashes or ulcers:        Constitutional    Fever or chills:      PHYSICAL EXAM:   There were no vitals filed for this visit.  GENERAL: The patient is a well-nourished female, in no acute distress. The vital signs are documented above. CARDIAC: There is a regular rate and rhythm.  VASCULAR: Palpable right dorsalis pedis, left posterior tibial PULMONARY: Non-labored respirations MUSCULOSKELETAL: There are no major deformities or cyanosis. NEUROLOGIC: No focal weakness or paresthesias are detected. SKIN: There are no ulcers or rashes noted. PSYCHIATRIC: The patient has a normal affect.  STUDIES:   I reviewed the following vascular lab studies:  EVAR: Maximum diameter was 2.7 cm which is slightly decreased from the prior study.  +-------+-----------+-----------+------------+------------+  ABI/TBIToday's ABIToday's TBIPrevious ABIPrevious TBI  +-------+-----------+-----------+------------+------------+  Right  1.19       1.01       1.03        [place]            +-------+-----------+-----------+------------+------------+  Left    1.11       0.72  0.98        0.82          +-------+-----------+-----------+------------+------------+   Right toe pressure: 124 Left toe pressure: 88  MEDICAL ISSUES:   AAA: Maximum diameter is 2.7 cm which is down from prior visit.  I will monitor this and 1 year with an ultrasound  Carotid: She remains asymptomatic.  She is scheduled for repeat duplex in 1 year  PAD: She has palpable DP on the right and PT on the left.  She is having leg pain but this is attributed to her bone spurs and not her vascular disease.  I will repeat her ABIs when she returns    Durene Cal, IV, MD, FACS [facility] [phone] Pager [phone]

## 2021-05-28 ENCOUNTER — Encounter: Payer: Self-pay | Admitting: Cardiology

## 2021-05-28 ENCOUNTER — Ambulatory Visit (INDEPENDENT_AMBULATORY_CARE_PROVIDER_SITE_OTHER): Payer: Medicare Other | Admitting: Cardiology

## 2021-05-28 ENCOUNTER — Other Ambulatory Visit: Payer: Self-pay

## 2021-05-28 ENCOUNTER — Telehealth: Payer: Self-pay | Admitting: Cardiology

## 2021-05-28 ENCOUNTER — Other Ambulatory Visit (HOSPITAL_COMMUNITY)
Admission: RE | Admit: 2021-05-28 | Discharge: 2021-05-28 | Disposition: A | Discharge status: Home / Self Care | Payer: Medicare Other | Source: Ambulatory Visit | Attending: Cardiology | Admitting: Cardiology

## 2021-05-28 VITALS — BP 118/64 | HR 81 | Ht 65.5 in | Wt 214.0 lb

## 2021-05-28 DIAGNOSIS — I4819 Other persistent atrial fibrillation: Secondary | ICD-10-CM

## 2021-05-28 DIAGNOSIS — I70211 Atherosclerosis of native arteries of extremities with intermittent claudication, right leg: Secondary | ICD-10-CM | POA: Diagnosis not present

## 2021-05-28 DIAGNOSIS — E782 Mixed hyperlipidemia: Secondary | ICD-10-CM

## 2021-05-28 DIAGNOSIS — I25119 Atherosclerotic heart disease of native coronary artery with unspecified angina pectoris: Secondary | ICD-10-CM

## 2021-05-28 LAB — BASIC METABOLIC PANEL
Anion gap: 6 (ref 5–15)
BUN: 16 mg/dL (ref 8–23)
CO2: 27 mmol/L (ref 22–32)
Calcium: 9.6 mg/dL (ref 8.9–10.3)
Chloride: 103 mmol/L (ref 98–111)
Creatinine, Ser: 0.77 mg/dL (ref 0.44–1.00)
GFR, Estimated: >60 mL/min (ref >60)
Glucose, Bld: 94 mg/dL (ref 70–99)
Potassium: 4.2 mmol/L (ref 3.5–5.1)
Sodium: 136 mmol/L (ref 135–145)

## 2021-05-28 LAB — CBC
HCT: 45.8 % (ref 36.0–46.0)
Hemoglobin: 15.5 g/dL — ABNORMAL HIGH (ref 12.0–15.0)
MCH: 33 pg (ref 26.0–34.0)
MCHC: 33.8 g/dL (ref 30.0–36.0)
MCV: 97.4 fL (ref 80.0–100.0)
Platelets: 228 10*3/uL (ref 150–400)
RBC: 4.7 MIL/uL (ref 3.87–5.11)
RDW: 13.4 % (ref 11.5–15.5)
WBC: 6.1 10*3/uL (ref 4.0–10.5)
nRBC: 0 % (ref 0.0–0.2)

## 2021-05-28 NOTE — Telephone Encounter (Signed)
I spoke with patient. Lab results discussed , copied pcp

## 2021-05-28 NOTE — Progress Notes (Signed)
Cardiology Office Note  Date: 05/28/2021   ID: Heather Livingston, Heather Livingston [birth year], MRN [id number]  PCP:  Theodoro Kos, MD  Cardiologist:  Nona Dell, MD Electrophysiologist:  None   Chief Complaint  Patient presents with   Cardiac follow-up     History of Present Illness: Heather Livingston is a 70 y.o. female last seen in April.  She is here for a follow-up visit.  Since last encounter she has not noticed any sense of palpitations, but is reporting intermittent angina symptoms.  No obvious spontaneous bleeding problems on Eliquis.  She is due for follow-up lab work.  I reviewed her medications which are outlined below.  Blood pressure and heart rate are well controlled today.  Cardiac catheterization in 2019 revealed patent SVG to PDA, patent LIMA to LAD, and known occlusion of the SVG to OM with otherwise mild to moderate residual disease that was managed medically.  Past Medical History:  Diagnosis Date   Abdominal aortic aneurysm (AAA) (HCC)    Arthritis    Atrial fibrillation (HCC)    Bradycardia    Brain aneurysm    Cataracts, bilateral    Coronary atherosclerosis of native coronary artery    DES circ 10/10, residual 100% occluded SVG to OM, patent LIMA to LAD and SVG to PDA grafts, LVEF 50%   GERD (gastroesophageal reflux disease)    History of hiatal hernia    History of stroke    Incidental findings by CT imaging   Hyperlipemia    Hypertension    Low iron    MI (myocardial infarction) (HCC)    Nonruptured cerebral aneurysm    OSA (obstructive sleep apnea)    On CPAP   Pneumonia    Restless legs     Past Surgical History:  Procedure Laterality Date   ABDOMINAL AORTIC ENDOVASCULAR STENT GRAFT N/A 05/05/2018   Procedure: ABDOMINAL AORTIC ENDOVASCULAR STENT GRAFT;  Surgeon: Nada Libman, MD;  Location: [facility] OR;  Service: Vascular;  Laterality: N/A;   ABDOMINAL AORTOGRAM N/A 01/19/2018   Procedure: ABDOMINAL AORTOGRAM;  Surgeon: Corky Crafts, MD;   Location: [facility] INVASIVE CV LAB;  Service: Cardiovascular;  Laterality: N/A;   ABDOMINAL HYSTERECTOMY     CARPAL TUNNEL RELEASE     CORONARY ARTERY BYPASS GRAFT  2009   [facility], LIMA to LAD, SVG to OM, SVG to PDA   ENDOVASCULAR REPAIR/STENT GRAFT  05/05/2018   IR ANGIO INTRA EXTRACRAN SEL INTERNAL CAROTID BILAT MOD SED  02/23/2018   IR ANGIO VERTEBRAL SEL SUBCLAVIAN INNOMINATE UNI R MOD SED  04/14/2019   IR ANGIO VERTEBRAL SEL VERTEBRAL BILAT MOD SED  02/23/2018   IR ANGIO VERTEBRAL SEL VERTEBRAL UNI L MOD SED  03/18/2018   IR ANGIOGRAM FOLLOW UP STUDY  03/18/2018   IR ANGIOGRAM FOLLOW UP STUDY  03/18/2018   IR ANGIOGRAM FOLLOW UP STUDY  03/18/2018   IR ANGIOGRAM FOLLOW UP STUDY  03/18/2018   IR TRANSCATH/EMBOLIZ  03/18/2018   IR US GUIDE VASC ACCESS RIGHT  04/14/2019   LEFT HEART CATH AND CORS/GRAFTS ANGIOGRAPHY N/A 01/19/2018   Procedure: LEFT HEART CATH AND CORS/GRAFTS ANGIOGRAPHY;  Surgeon: Corky Crafts, MD;  Location: [facility] INVASIVE CV LAB;  Service: Cardiovascular;  Laterality: N/A;   LEFT HEART CATHETERIZATION WITH CORONARY/GRAFT ANGIOGRAM N/A 12/27/2012   Procedure: LEFT HEART CATHETERIZATION WITH Isabel Caprice;  Surgeon: Herby Abraham, MD;  Location: [facility] CATH LAB;  Service: Cardiovascular;  Laterality: N/A;   RADIOLOGY WITH ANESTHESIA  N/A 03/18/2018   Procedure: coil embolization of basilar aneurysm;  Surgeon: Lisbeth Renshaw, MD;  Location: [facility] OR;  Service: Radiology;  Laterality: N/A;    Current Outpatient Medications  Medication Sig Dispense Refill   albuterol (VENTOLIN HFA) 108 (90 Base) MCG/ACT inhaler Inhale 1-2 puffs into the lungs every 6 (six) hours as needed for shortness of breath.      amLODipine (NORVASC) 5 MG tablet TAKE 1 TABLET BY MOUTH EVERY DAY 90 tablet 3   apixaban (ELIQUIS) 5 MG TABS tablet Take 1 tablet (5 mg total) by mouth 2 (two) times daily. 60 tablet 6   aspirin EC 81 MG tablet Take 1 tablet (81 mg total) by mouth daily.     fluticasone (FLONASE) 50  MCG/ACT nasal spray Place 2 sprays into both nostrils at bedtime.     hydrALAZINE (APRESOLINE) 100 MG tablet TAKE 1 TABLET BY MOUTH THREE TIMES A DAY 270 tablet 3   hydrochlorothiazide (HYDRODIURIL) 12.5 MG tablet TAKE 2 TABLETS BY MOUTH EVERY DAY 180 tablet 3   isosorbide mononitrate (IMDUR) 60 MG 24 hr tablet TAKE 1 TABLET BY MOUTH EVERY DAY 90 tablet 3   losartan (COZAAR) 100 MG tablet TAKE 1 TABLET BY MOUTH EVERY DAY 90 tablet 1   magnesium oxide (MAG-OX) 400 (241.3 Mg) MG tablet Take 400 mg by mouth daily.   3   metoprolol tartrate (LOPRESSOR) 25 MG tablet Take 1.5 tablets (37.5 mg total) by mouth 2 (two) times daily. 270 tablet 1   montelukast (SINGULAIR) 10 MG tablet Take 10 mg by mouth daily.     nitroGLYCERIN (NITROSTAT) 0.4 MG SL tablet Place 1 tablet (0.4 mg total) under the tongue every 5 (five) minutes x 3 doses as needed for chest pain (if no relief after 3rd dose, proceed to the ED for an evaluation or call 911). 25 tablet 3   simvastatin (ZOCOR) 40 MG tablet TAKE 1 TABLET BY MOUTH EVERYDAY AT BEDTIME 90 tablet 3   spironolactone (ALDACTONE) 25 MG tablet TAKE 1/2 TABLET BY MOUTH EVERY DAY 45 tablet 3   No current facility-administered medications for this visit.   Allergies:  Patient has no known allergies.   ROS:  No dizziness or syncope.  Physical Exam: VS:  BP 118/64   Pulse 81   Ht 5' 5.5" (1.664 m)   Wt 214 lb (97.1 kg)   SpO2 96%   BMI 35.07 kg/m , BMI Body mass index is 35.07 kg/m.  Wt Readings from Last 3 Encounters:  05/28/21 214 lb (97.1 kg)  05/20/21 210 lb (95.3 kg)  01/31/21 215 lb (97.5 kg)    General: Patient appears comfortable at rest. HEENT: Conjunctiva and lids normal, wearing a mask. Neck: Supple, no elevated JVP or carotid bruits, no thyromegaly. Lungs: Decreased breath sounds without active wheezing, nonlabored breathing at rest. Cardiac: Irregularly irregular, no S3 or significant systolic murmur, no pericardial rub. Extremities: No pitting  edema.  ECG:  An ECG dated 01/31/2021 was personally reviewed today and demonstrated:  Atrial fibrillation with PVC, old inferior infarct pattern, nonspecific ST-T changes.  Recent Labwork:    Component Value Date/Time   CHOL 85 01/20/2018 0206   TRIG 162 (H) 01/20/2018 0206   HDL 25 (L) 01/20/2018 0206   CHOLHDL 3.4 01/20/2018 0206   VLDL 32 01/20/2018 0206   LDLCALC 28 01/20/2018 0206  April 2022: Hemoglobin 15.3, platelets 219, potassium 4.2, BUN 18, creatinine 0.94  Other Studies Reviewed Today:  Carotid Doppler 04/19/2020: Summary:  Right  Carotid: Velocities in the right ICA are consistent with a 1-39%  stenosis.                 The ECA appears >50% stenosed. Irregular cardiac rhythm.   Left Carotid: Velocities in the left ICA are consistent with a 1-39%  stenosis.                The ECA appears >50% stenosed. Irregular cardiac rhythm.   Vertebrals:  Bilateral vertebral arteries demonstrate antegrade flow.  Subclavians: Normal flow hemodynamics were seen in bilateral subclavian               arteries.   Assessment and Plan:  1.  Multivessel CAD status post CABG and previous DES intervention of the circumflex.  Cardiac catheterization from 2019 as discussed above.  She is reporting intermittent angina symptoms on good medical regimen.  We will plan a follow-up Lexiscan Myoview to reassess ischemic burden.  2.  Persistent atrial fibrillation with CHA2DS2-VASc score of 7.  She is asymptomatic in terms of palpitations and has had good heart rate control on beta-blocker.  Plan to manage with strategy of heart rate control and anticoagulation.  Continue Eliquis and check CBC with BMET.  3.  Mixed hyperlipidemia, continues on Zocor with last LDL 28.  Medication Adjustments/Labs and Tests Ordered: Current medicines are reviewed at length with the patient today.  Concerns regarding medicines are outlined above.   Tests Ordered: Orders Placed This Encounter  Procedures   NM  Myocar Multi W/Spect W/Wall Motion / EF   CBC   Basic metabolic panel     Medication Changes: No orders of the defined types were placed in this encounter.   Disposition:  Follow up  6 months.  Signed, Jonelle Sidle, MD, [facility] 05/28/2021 9:39 AM    [facility] at [facility] [address], [place], [place] [address] Phone: [phone]; Fax: [phone]

## 2021-05-28 NOTE — Telephone Encounter (Signed)
Patient returning a call to [facility] for her lab results

## 2021-05-28 NOTE — Patient Instructions (Signed)
Medication Instructions:  Your physician recommends that you continue on your current medications as directed. Please refer to the Current Medication list given to you today.  *If you need a refill on your cardiac medications before your next appointment, please call your pharmacy*   Lab Work:  CBC,BMET Today  If you have labs (blood work) drawn today and your tests are completely normal, you will receive your results only by: MyChart Message (if you have MyChart) OR A paper copy in the mail If you have any lab test that is abnormal or we need to change your treatment, we will call you to review the results.   Testing/Procedures: Your physician has requested that you have a lexiscan myoview. For further information please visit [email or web address] Please follow instruction sheet, as given.    Follow-Up: At [facility], you and your health needs are our priority.  As part of our continuing mission to provide you with exceptional heart care, we have created designated Provider Care Teams.  These Care Teams include your primary Cardiologist (physician) and Advanced Practice Providers (APPs -  Physician Assistants and Nurse Practitioners) who all work together to provide you with the care you need, when you need it.  We recommend signing up for the patient portal called "MyChart".  Sign up information is provided on this After Visit Summary.  MyChart is used to connect with patients for Virtual Visits (Telemedicine).  Patients are able to view lab/test results, encounter notes, upcoming appointments, etc.  Non-urgent messages can be sent to your provider as well.   To learn more about what you can do with MyChart, go to [email or web address].    Your next appointment:   6 month(s)  The format for your next appointment:   In Person  Provider:   Nona Dell, MD   Other Instructions None

## 2021-06-07 ENCOUNTER — Ambulatory Visit (HOSPITAL_COMMUNITY)
Admission: RE | Admit: 2021-06-07 | Discharge: 2021-06-07 | Disposition: A | Discharge status: Home / Self Care | Payer: Medicare Other | Source: Ambulatory Visit | Attending: Cardiology | Admitting: Cardiology

## 2021-06-07 ENCOUNTER — Other Ambulatory Visit: Payer: Self-pay

## 2021-06-07 ENCOUNTER — Encounter (HOSPITAL_COMMUNITY): Payer: Self-pay

## 2021-06-07 DIAGNOSIS — I25119 Atherosclerotic heart disease of native coronary artery with unspecified angina pectoris: Secondary | ICD-10-CM | POA: Diagnosis not present

## 2021-06-07 LAB — NM MYOCAR MULTI W/SPECT W/WALL MOTION / EF
LV dias vol: 125 mL (ref 46–106)
LV sys vol: 58 mL
Nuc Stress EF: 54 %
Peak HR: 81 {beats}/min
RATE: 0.3
Rest HR: 60 {beats}/min
Rest Nuclear Isotope Dose: 11 mCi
SDS: 3
SRS: 8
SSS: 11
ST Depression (mm): 0 mm
Stress Nuclear Isotope Dose: 30.4 mCi
TID: 1.21

## 2021-06-07 MED ORDER — SODIUM CHLORIDE FLUSH 0.9 % IV SOLN
INTRAVENOUS | Status: AC
  Administered 2021-06-07: 10 mL via INTRAVENOUS
  Filled 2021-06-07: qty 10

## 2021-06-07 MED ORDER — TECHNETIUM TC 99M TETROFOSMIN IV KIT
10.0000 | PACK | Freq: Once | INTRAVENOUS | Status: AC | PRN
  Administered 2021-06-07: 11 via INTRAVENOUS

## 2021-06-07 MED ORDER — REGADENOSON 0.4 MG/5ML IV SOLN
INTRAVENOUS | Status: AC
  Administered 2021-06-07: 0.4 mg via INTRAVENOUS
  Filled 2021-06-07: qty 5

## 2021-06-07 MED ORDER — TECHNETIUM TC 99M TETROFOSMIN IV KIT
30.0000 | PACK | Freq: Once | INTRAVENOUS | Status: AC | PRN
  Administered 2021-06-07: 30.4 via INTRAVENOUS

## 2021-06-09 ENCOUNTER — Other Ambulatory Visit: Payer: Self-pay | Admitting: Cardiology

## 2021-06-10 ENCOUNTER — Telehealth: Payer: Self-pay | Admitting: *Deleted

## 2021-06-10 ENCOUNTER — Other Ambulatory Visit: Payer: Self-pay

## 2021-06-10 MED ORDER — METOPROLOL TARTRATE 25 MG PO TABS: 37.5000 mg | ORAL_TABLET | Freq: Two times a day (BID) | ORAL | 1 refills | Status: DC

## 2021-06-10 NOTE — Telephone Encounter (Signed)
Patient informed. Copy sent to PCP °

## 2021-06-10 NOTE — Telephone Encounter (Signed)
Refilled lopressor 37.5 mg bid to [facility]

## 2021-06-10 NOTE — Telephone Encounter (Signed)
-----   Message from Jonelle Sidle, MD sent at 06/07/2021  1:11 PM EDT ----- Results reviewed.  I also reviewed the study images.  Stress test was overall low risk, does not have a large ischemic territory.  Inferior infarct with mild peri-infarct ischemia is consistent with her known graft disease and LVEF is normal.  Would recommend continuing medical therapy unless angina symptoms worsen.

## 2021-09-28 ENCOUNTER — Other Ambulatory Visit: Payer: Self-pay | Admitting: Cardiology

## 2021-09-30 NOTE — Telephone Encounter (Signed)
Prescription refill request for Eliquis received. Indication: Atrial fib Last office visit: 05/28/21  Ival Bible MD Scr: 0.77 on 05/28/21 Age: 70 Weight: 97.1kg  Based on above findings Eliquis 5mg  twice daily is the appropriate dose.  Refill approved.

## 2021-12-06 ENCOUNTER — Ambulatory Visit: Payer: Medicare Other | Admitting: Cardiology

## 2021-12-09 ENCOUNTER — Telehealth: Payer: Self-pay | Admitting: Cardiology

## 2021-12-09 NOTE — Telephone Encounter (Signed)
Patient with diagnosis of afib on Eliquis for anticoagulation.    Procedure: colonoscopy Date of procedure: 04/09/22  CHA2DS2-VASc Score = 6  This indicates a 9.7% annual risk of stroke. The patient's score is based upon: CHF History: 0 HTN History: 1 Diabetes History: 0 Stroke History: 2 Vascular Disease History: 1 Age Score: 1 Gender Score: 1   CrCl 38mL/min using adjusted body weight Platelet count 202K  Per office protocol, patient can hold Eliquis for 1 day prior to procedure. She should resume as soon as safely possible after due to elevated CV risk.

## 2021-12-09 NOTE — Telephone Encounter (Signed)
° °  Pre-operative Risk Assessment    Patient Name: Heather Livingston  DOB: 07-20-1951 MRN: [id number]      Request for Surgical Clearance    Procedure:   Colonoscopy   Date of Surgery:  Clearance 04/09/22                                 Surgeon:  Dr. Willeen Niece Group or Practice Name:  [facility]  Phone number:  [phone] Fax number:  [phone]   Type of Clearance Requested:   - Pharmacy:  Hold Apixaban (Eliquis) Does not indicate    Type of Anesthesia:  Not Indicated   Additional requests/questions:    [name]   12/09/2021, 2:38 PM

## 2021-12-24 ENCOUNTER — Other Ambulatory Visit: Payer: Self-pay | Admitting: Cardiology

## 2022-01-18 ENCOUNTER — Other Ambulatory Visit: Payer: Self-pay | Admitting: Cardiology

## 2022-01-29 NOTE — Progress Notes (Signed)
? ? ?Cardiology Office Note ? ?Date: 01/30/2022  ? ?ID: Heather Livingston, DOB 1951/07/24, MRN [id number] ? ?PCP:  Galen Manila, MD  ?Cardiologist:  Rozann Lesches, MD ?Electrophysiologist:  None  ? ?Chief Complaint  ?Patient presents with  ? Cardiac follow-up  ? ? ?History of Present Illness: ?Heather Livingston is a 71 y.o. female last seen in August 2022.  She is here for a routine visit.  Reports no definite angina, no nitroglycerin use, no sense of palpitations.  She has chronic dyspnea on exertion, NYHA class II-III depending on level of activity.  This has not changed.  No orthopnea or PND. ? ?CHA2DS2-VASc score is 6.  She remains on Eliquis.  I did review her interval lab work from January.  She does not report any spontaneous bleeding problems. ? ?I reviewed her medications, no changes from a cardiac perspective.  Do plan to refill fresh bottle of nitroglycerin.  She is tolerating Zocor with recent LDL 46.  I personally reviewed her ECG today which shows rate controlled atrial fibrillation with nonspecific ST-T changes. ? ?Past Medical History:  ?Diagnosis Date  ? Abdominal aortic aneurysm (AAA) ([place])   ? Arthritis   ? Atrial fibrillation ([place])   ? Bradycardia   ? Brain aneurysm   ? Cataracts, bilateral   ? Coronary atherosclerosis of native coronary artery   ? DES circ 10/10, residual 100% occluded SVG to OM, patent LIMA to LAD and SVG to PDA grafts, LVEF 50%  ? GERD (gastroesophageal reflux disease)   ? History of hiatal hernia   ? History of stroke   ? Incidental findings by CT imaging  ? Hyperlipemia   ? Hypertension   ? Low iron   ? MI (myocardial infarction) ([place])   ? Nonruptured cerebral aneurysm   ? OSA (obstructive sleep apnea)   ? On CPAP  ? Pneumonia   ? Restless legs   ? ? ?Past Surgical History:  ?Procedure Laterality Date  ? ABDOMINAL AORTIC ENDOVASCULAR STENT GRAFT N/A 05/05/2018  ? Procedure: ABDOMINAL AORTIC ENDOVASCULAR STENT GRAFT;  Surgeon: Serafina Mitchell, MD;  Location: [facility];  Service:  Vascular;  Laterality: N/A;  ? ABDOMINAL AORTOGRAM N/A 01/19/2018  ? Procedure: ABDOMINAL AORTOGRAM;  Surgeon: Jettie Booze, MD;  Location: [place] [facility] LAB;  Service: Cardiovascular;  Laterality: N/A;  ? ABDOMINAL HYSTERECTOMY    ? CARPAL TUNNEL RELEASE    ? CORONARY ARTERY BYPASS GRAFT  2009  ? [facility], LIMA to LAD, SVG to OM, SVG to PDA  ? ENDOVASCULAR REPAIR/STENT GRAFT  05/05/2018  ? IR ANGIO INTRA EXTRACRAN SEL INTERNAL CAROTID BILAT MOD SED  02/23/2018  ? IR ANGIO VERTEBRAL SEL SUBCLAVIAN INNOMINATE UNI R MOD SED  04/14/2019  ? IR ANGIO VERTEBRAL SEL VERTEBRAL BILAT MOD SED  02/23/2018  ? IR ANGIO VERTEBRAL SEL VERTEBRAL UNI L MOD SED  03/18/2018  ? IR ANGIOGRAM FOLLOW UP STUDY  03/18/2018  ? IR ANGIOGRAM FOLLOW UP STUDY  03/18/2018  ? IR ANGIOGRAM FOLLOW UP STUDY  03/18/2018  ? IR ANGIOGRAM FOLLOW UP STUDY  03/18/2018  ? IR TRANSCATH/EMBOLIZ  03/18/2018  ? IR US GUIDE VASC ACCESS RIGHT  04/14/2019  ? LEFT HEART CATH AND CORS/GRAFTS ANGIOGRAPHY N/A 01/19/2018  ? Procedure: LEFT HEART CATH AND CORS/GRAFTS ANGIOGRAPHY;  Surgeon: Jettie Booze, MD;  Location: [place] [facility];  Service: Cardiovascular;  Laterality: N/A;  ? LEFT HEART CATHETERIZATION WITH CORONARY/GRAFT ANGIOGRAM N/A 12/27/2012  ? Procedure: LEFT HEART  CATHETERIZATION WITH Beatrix Fetters;  Surgeon: Hillary Bow, MD;  [facility] CATH LAB;  Service: Cardiovascular;  Laterality: N/A;  ? RADIOLOGY WITH ANESTHESIA N/A 03/18/2018  ? Procedure: coil embolization of basilar aneurysm;  Surgeon: Consuella Lose, MD;  Location: [place];  Service: Radiology;  Laterality: N/A;  ? ? ?Current Outpatient Medications  ?Medication Sig Dispense Refill  ? amLODipine (NORVASC) 5 MG tablet TAKE 1 TABLET BY MOUTH EVERY DAY 90 tablet 3  ? aspirin EC 81 MG tablet Take 1 tablet (81 mg total) by mouth daily.    ? Azelastine HCl (ASTEPRO) 0.15 % SOLN Place 1 spray into the nose 2 (two) times daily.    ? BREO ELLIPTA 100-25 MCG/ACT AEPB Inhale 1 puff into the  lungs daily.    ? ELIQUIS 5 MG TABS tablet TAKE 1 TABLET BY MOUTH TWICE A DAY 60 tablet 5  ? hydrALAZINE (APRESOLINE) 100 MG tablet TAKE 1 TABLET BY MOUTH THREE TIMES A DAY 270 tablet 3  ? hydrochlorothiazide (HYDRODIURIL) 12.5 MG tablet TAKE 2 TABLETS BY MOUTH EVERY DAY 180 tablet 3  ? isosorbide mononitrate (IMDUR) 60 MG 24 hr tablet TAKE 1 TABLET BY MOUTH EVERY DAY 90 tablet 3  ? losartan (COZAAR) 100 MG tablet TAKE 1 TABLET BY MOUTH EVERY DAY 90 tablet 1  ? magnesium oxide (MAG-OX) 400 (241.3 Mg) MG tablet Take 400 mg by mouth daily.   3  ? metoprolol tartrate (LOPRESSOR) 25 MG tablet TAKE 1&1/2 TABLETS (37.5 MG TOTAL) BY MOUTH 2 (TWO) TIMES DAILY. 270 tablet 1  ? montelukast (SINGULAIR) 10 MG tablet Take 10 mg by mouth daily.    ? simvastatin (ZOCOR) 40 MG tablet TAKE 1 TABLET BY MOUTH EVERYDAY AT BEDTIME 90 tablet 3  ? spironolactone (ALDACTONE) 25 MG tablet TAKE 1/2 TABLET BY MOUTH EVERY DAY 45 tablet 3  ? nitroGLYCERIN (NITROSTAT) 0.4 MG SL tablet Place 1 tablet (0.4 mg total) under the tongue every 5 (five) minutes x 3 doses as needed for chest pain (if no relief after 2nd dose, proceed to the ED for an evaluation or call 911). 25 tablet 3  ? ?No current facility-administered medications for this visit.  ? ?Allergies:  Patient has no known allergies.  ? ?ROS:  No syncope. ? ?Physical Exam: ?VS:  BP 114/68   Pulse 65   Ht 5' 5.5" (1.664 m)   Wt 225 lb 3.2 oz (102.2 kg)   SpO2 97%   BMI 36.91 kg/m? , BMI Body mass index is 36.91 kg/m?. ? ?Wt Readings from Last 3 Encounters:  ?01/30/22 225 lb 3.2 oz (102.2 kg)  ?05/28/21 214 lb (97.1 kg)  ?05/20/21 210 lb (95.3 kg)  ?  ?General: Patient appears comfortable at rest. ?HEENT: Conjunctiva and lids normal. ?Neck: Supple, no elevated JVP or carotid bruits, no thyromegaly. ?Lungs: Clear to auscultation, nonlabored breathing at rest. ?Cardiac: Irregularly irregular, no S3 or significant systolic murmur, no pericardial rub. ?Extremities: No pitting edema. ? ?ECG:   An ECG dated 01/31/2021 was personally reviewed today and demonstrated:  Atrial fibrillation with PVC, old inferior infarct pattern, nonspecific ST-T changes. ? ?Recent Labwork: ?05/28/2021: BUN 16; Creatinine, Ser 0.77; Hemoglobin 15.5; Platelets 228; Potassium 4.2; Sodium 136  ?January 2023: Hemoglobin A1c 6%, TSH 2.56, hemoglobin 15.3, platelets 202, magnesium 1.8, cholesterol 97, triglycerides 94, HDL 32, LDL 46, potassium 4.6, BUN 19, creatinine 0.89, AST 22, ALT 23 ? ?Other Studies Reviewed Today: ? ?Lexiscan Myoview 06/07/2021: ?  The study is low risk. Anterior  infarct without current ischemia. Inferior infarct with mild peri-infarct ischemia. ?  No ST deviation was noted. ?  Defect 1: There is a medium defect with moderate reduction in uptake present in the mid to basal anterior location(s) that is fixed. Defect 2: There is a medium defect with moderate reduction in uptake present in the apical to basal inferior location(s) that is mildly reversible. ?  Left ventricular function is normal. End diastolic cavity size is mildly enlarged. ? ?Assessment and Plan: ? ?1.  Multivessel CAD status post CABG and DES intervention to the circumflex.  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  Follow-up Myoview last year showed evidence of infarct scar with mild peri-infarct ischemia.  No progressive angina at this time with chronic dyspnea on exertion.  Continue aspirin, Imdur, Norvasc, hydralazine, Lopressor, Cozaar, Zocor, and as needed nitroglycerin which will be refilled. ? ?2.  Persistent/permanent atrial fibrillation with CHA2DS2-VASc score of 7.  Plan to continue heart rate control strategy with Lopressor.  She remains on Eliquis for stroke prophylaxis.  I reviewed her recent lab work.  She reports no spontaneous bleeding problems. ? ?3.  Mixed hyperlipidemia on Zocor with last LDL 46. ? ?Medication Adjustments/Labs and Tests Ordered: ?Current medicines are reviewed at  length with the patient today.  Concerns regarding medicines are outlined above.  ? ?Tests Ordered: ?Orders Placed This Encounter  ?Procedures  ? EKG 12-Lead  ? ? ?Medication Changes: ?Meds ordered this encounte

## 2022-01-30 ENCOUNTER — Encounter: Payer: Self-pay | Admitting: Cardiology

## 2022-01-30 ENCOUNTER — Ambulatory Visit (INDEPENDENT_AMBULATORY_CARE_PROVIDER_SITE_OTHER): Payer: Medicare Other | Admitting: Cardiology

## 2022-01-30 VITALS — BP 114/68 | HR 65 | Ht 65.5 in | Wt 225.2 lb

## 2022-01-30 DIAGNOSIS — I4819 Other persistent atrial fibrillation: Secondary | ICD-10-CM

## 2022-01-30 DIAGNOSIS — I25119 Atherosclerotic heart disease of native coronary artery with unspecified angina pectoris: Secondary | ICD-10-CM

## 2022-01-30 DIAGNOSIS — E782 Mixed hyperlipidemia: Secondary | ICD-10-CM

## 2022-01-30 MED ORDER — NITROGLYCERIN 0.4 MG SL SUBL: 0.4000 mg | SUBLINGUAL_TABLET | SUBLINGUAL | 3 refills | Status: DC | PRN

## 2022-01-30 NOTE — Patient Instructions (Addendum)

## 2022-03-12 ENCOUNTER — Other Ambulatory Visit: Payer: Self-pay | Admitting: Cardiology

## 2022-04-01 ENCOUNTER — Telehealth: Payer: Self-pay | Admitting: Cardiology

## 2022-04-01 NOTE — Telephone Encounter (Signed)
Sent message to pre op provider to please review if the pt has been cleared. Clearance note 2/27 and Dr. Diona Browner 01/2022.

## 2022-04-01 NOTE — Telephone Encounter (Signed)
Calling to f/u on Clearance that was sent via fax on 12/09/21. Pt scheduled to have procedure on 04/09/22. Please advise

## 2022-04-01 NOTE — Telephone Encounter (Signed)
Need to clarify if they are requesting medical clearance or eliquis hold only. If only need eliquis hold, may use recommendations from 12/09/21. If medical clearance is needed, will need to have a tele visit.   Marcelino Duster, PA-C 04/01/2022, 7:26 PM [phone] [facility] [address] [place], [place] [address]

## 2022-04-02 NOTE — Telephone Encounter (Signed)
Returned call to [place], left a message for her to call back.

## 2022-04-02 NOTE — Telephone Encounter (Signed)
Stephanie from [facility] returned the call, re-faxed clearance for Eliquis hold from 12/09/21 to [phone].

## 2022-04-06 ENCOUNTER — Other Ambulatory Visit: Payer: Self-pay | Admitting: Cardiology

## 2022-05-03 ENCOUNTER — Other Ambulatory Visit: Payer: Self-pay | Admitting: Cardiology

## 2022-06-11 ENCOUNTER — Other Ambulatory Visit: Payer: Self-pay | Admitting: Cardiology

## 2022-06-23 ENCOUNTER — Other Ambulatory Visit: Payer: Self-pay | Admitting: Cardiology

## 2022-08-07 NOTE — Progress Notes (Signed)
Cardiology Office Note  Date: 08/08/2022   ID: Heather Livingston 09-04-1951, MRN [id number]  PCP:  Heather Manila, MD  Cardiologist:  Heather Lesches, MD Electrophysiologist:  None   Chief Complaint  Patient presents with   Cardiac follow-up    History of Present Illness: Heather Livingston is a 71 y.o. female last seen in April.  She is here for a routine visit.  Reports chronic dyspnea on exertion, NYHA class II with typical activities.  No angina or sense of palpitations.  She does mention that she has had lightheadedness when she stands, somewhat unsteady but no falls.  Blood pressure is low normal today.  She has lost over 10 pounds since last visit.  We went over her medications, she is actually been taking hydralazine 100 mg only twice daily in addition to her antihypertensive regimen.  We discussed reducing the dose.  Reports no bleeding problems on Eliquis.  We are requesting her interval lab work from Dr. Bobby Livingston.  Past Medical History:  Diagnosis Date   Abdominal aortic aneurysm (AAA) ([facility])    Arthritis    Atrial fibrillation ([facility])    Bradycardia    Brain aneurysm    Cataracts, bilateral    Coronary atherosclerosis of native coronary artery    DES circ 10/10, residual 100% occluded SVG to OM, patent LIMA to LAD and SVG to PDA grafts, LVEF 50%   GERD (gastroesophageal reflux disease)    History of hiatal hernia    History of stroke    Incidental findings by CT imaging   Hyperlipemia    Hypertension    Low iron    MI (myocardial infarction) ([facility])    Nonruptured cerebral aneurysm    OSA (obstructive sleep apnea)    On CPAP   Pneumonia    Restless legs     Past Surgical History:  Procedure Laterality Date   ABDOMINAL AORTIC ENDOVASCULAR STENT GRAFT N/A 05/05/2018   Procedure: ABDOMINAL AORTIC ENDOVASCULAR STENT GRAFT;  Surgeon: Heather Mitchell, MD;  Location: [place];  Service: Vascular;  Laterality: N/A;   ABDOMINAL AORTOGRAM N/A 01/19/2018   Procedure:  ABDOMINAL AORTOGRAM;  Surgeon: Heather Booze, MD;  Location: [place] [facility];  Service: Cardiovascular;  Laterality: N/A;   ABDOMINAL HYSTERECTOMY     CARPAL TUNNEL RELEASE     CORONARY ARTERY BYPASS GRAFT  2009   [facility], LIMA to LAD, SVG to OM, SVG to PDA   ENDOVASCULAR REPAIR/STENT GRAFT  05/05/2018   IR ANGIO INTRA EXTRACRAN SEL INTERNAL CAROTID BILAT MOD SED  02/23/2018   IR ANGIO VERTEBRAL SEL SUBCLAVIAN INNOMINATE UNI R MOD SED  04/14/2019   IR ANGIO VERTEBRAL SEL VERTEBRAL BILAT MOD SED  02/23/2018   IR ANGIO VERTEBRAL SEL VERTEBRAL UNI L MOD SED  03/18/2018   IR ANGIOGRAM FOLLOW UP STUDY  03/18/2018   IR ANGIOGRAM FOLLOW UP STUDY  03/18/2018   IR ANGIOGRAM FOLLOW UP STUDY  03/18/2018   IR ANGIOGRAM FOLLOW UP STUDY  03/18/2018   IR TRANSCATH/EMBOLIZ  03/18/2018   IR US GUIDE VASC ACCESS RIGHT  04/14/2019   LEFT HEART CATH AND CORS/GRAFTS ANGIOGRAPHY N/A 01/19/2018   Procedure: LEFT HEART CATH AND CORS/GRAFTS ANGIOGRAPHY;  Surgeon: Heather Booze, MD;  Location: [place] [facility];  Service: Cardiovascular;  Laterality: N/A;   LEFT HEART CATHETERIZATION WITH CORONARY/GRAFT ANGIOGRAM N/A 12/27/2012   Procedure: LEFT HEART CATHETERIZATION WITH Beatrix Fetters;  Surgeon: Heather Bow, MD;  Location: [facility]  CATH LAB;  Service: Cardiovascular;  Laterality: N/A;   RADIOLOGY WITH ANESTHESIA N/A 03/18/2018   Procedure: coil embolization of basilar aneurysm;  Surgeon: Heather Renshaw, MD;  Location: [facility] OR;  Service: Radiology;  Laterality: N/A;    Current Outpatient Medications  Medication Sig Dispense Refill   amLODipine (NORVASC) 5 MG tablet TAKE 1 TABLET BY MOUTH EVERY DAY (Patient taking differently: Take 5 mg by mouth daily.) 90 tablet 3   aspirin EC 81 MG tablet Take 1 tablet (81 mg total) by mouth daily.     Azelastine HCl (ASTEPRO) 0.15 % SOLN Place 1 spray into the nose 2 (two) times daily.     BREO ELLIPTA 100-25 MCG/ACT AEPB Inhale 1 puff into the lungs daily.      ELIQUIS 5 MG TABS tablet TAKE 1 TABLET BY MOUTH TWICE A DAY 60 tablet 5   hydrochlorothiazide (HYDRODIURIL) 12.5 MG tablet TAKE 2 TABLETS BY MOUTH EVERY DAY 180 tablet 3   ipratropium (ATROVENT) 0.03 % nasal spray Place into both nostrils as needed.     isosorbide mononitrate (IMDUR) 60 MG 24 hr tablet TAKE 1 TABLET BY MOUTH EVERY DAY 90 tablet 3   losartan (COZAAR) 100 MG tablet TAKE 1 TABLET BY MOUTH EVERY DAY 90 tablet 1   magnesium oxide (MAG-OX) 400 (241.3 Mg) MG tablet Take 400 mg by mouth daily.   3   metoprolol tartrate (LOPRESSOR) 25 MG tablet TAKE 1&1/2 TABLETS (37.5 MG TOTAL) BY MOUTH 2 (TWO) TIMES DAILY. 270 tablet 1   montelukast (SINGULAIR) 10 MG tablet Take 10 mg by mouth daily.     nitroGLYCERIN (NITROSTAT) 0.4 MG SL tablet Place 1 tablet (0.4 mg total) under the tongue every 5 (five) minutes x 3 doses as needed for chest pain (if no relief after 2nd dose, proceed to the ED for an evaluation or call 911). 25 tablet 3   simvastatin (ZOCOR) 40 MG tablet TAKE 1 TABLET BY MOUTH EVERYDAY AT BEDTIME 90 tablet 3   spironolactone (ALDACTONE) 25 MG tablet TAKE 1/2 TABLET BY MOUTH EVERY DAY 45 tablet 3   hydrALAZINE (APRESOLINE) 100 MG tablet Take 0.5 tablets (50 mg total) by mouth 2 (two) times daily. 90 tablet 1   No current facility-administered medications for this visit.   Allergies:  Patient has no known allergies.   ROS: No palpitations or syncope.  Physical Exam: VS:  BP (!) 116/58   Pulse 65   Ht 5' 5.5" (1.664 m)   Wt 212 lb 9.6 oz (96.4 kg)   SpO2 96%   BMI 34.84 kg/m , BMI Body mass index is 34.84 kg/m.  Wt Readings from Last 3 Encounters:  08/08/22 212 lb 9.6 oz (96.4 kg)  01/30/22 225 lb 3.2 oz (102.2 kg)  05/28/21 214 lb (97.1 kg)    General: Patient appears comfortable at rest. HEENT: Conjunctiva and lids normal. Neck: Supple, no elevated JVP or carotid bruits. Lungs: Clear to auscultation, nonlabored breathing at rest. Cardiac: Irregularly irregular, no S3  or significant systolic murmur. Abdomen: Soft, nontender, bowel sounds present. Extremities: No pitting edema.  ECG:  An ECG dated 01/30/2022 was personally reviewed today and demonstrated:  Atrial fibrillation with nonspecific ST-T changes.  Recent Labwork:  January 2023: Hemoglobin A1c 6%, TSH 2.56, hemoglobin 15.3, platelets 202, magnesium 1.0, cholesterol 97, triglycerides 94, HDL 32, LDL 46, potassium 4.6, BUN 19, creatinine 0.89, AST 22, ALT 23  Other Studies Reviewed Today:  Lexiscan Myoview 06/07/2021:   The study is low risk.  Anterior infarct without current ischemia. Inferior infarct with mild peri-infarct ischemia.   No ST deviation was noted.   Defect 1: There is a medium defect with moderate reduction in uptake present in the mid to basal anterior location(s) that is fixed. Defect 2: There is a medium defect with moderate reduction in uptake present in the apical to basal inferior location(s) that is mildly reversible.   Left ventricular function is normal. End diastolic cavity size is mildly enlarged.  Assessment and Plan:  1.  Multivessel CAD status post CABG and DES to the circumflex.  She has residual graft disease with known occlusion of the SVG to OM managed medically.  No progressive angina symptoms with Myoview from last year noted above.  Plan to continue aspirin, Imdur, Lopressor, Norvasc, and Zocor.  As needed nitroglycerin is available.  2.  Essential hypertension, blood pressure low normal today.  She has had some orthostatic symptoms as well.  Plan to reduce hydralazine to 50 mg twice daily and otherwise continue her baseline regimen.  3.  Permanent atrial fibrillation with CHA2DS2-VASc score of 7.  Heart rate well controlled on current dose of Lopressor and she reports no palpitations.  Continue Eliquis for stroke prophylaxis and review interval lab work.  Medication Adjustments/Labs and Tests Ordered: Current medicines are reviewed at length with the patient  today.  Concerns regarding medicines are outlined above.   Tests Ordered: No orders of the defined types were placed in this encounter.   Medication Changes: Meds ordered this encounter  Medications   hydrALAZINE (APRESOLINE) 100 MG tablet    Sig: Take 0.5 tablets (50 mg total) by mouth 2 (two) times daily.    Dispense:  90 tablet    Refill:  1    08/08/22 Dose decrease    Disposition:  Follow up  6 months.  Signed, Jonelle Sidle, MD, [facility] 08/08/2022 9:03 AM    [facility] at [facility] [address] [place], [place], [place] [address] Phone: [phone]; Fax: [phone]

## 2022-08-08 ENCOUNTER — Ambulatory Visit: Payer: Medicare Other | Attending: Cardiology | Admitting: Cardiology

## 2022-08-08 ENCOUNTER — Encounter: Payer: Self-pay | Admitting: Cardiology

## 2022-08-08 VITALS — BP 116/58 | HR 65 | Ht 65.5 in | Wt 212.6 lb

## 2022-08-08 DIAGNOSIS — I4821 Permanent atrial fibrillation: Secondary | ICD-10-CM | POA: Insufficient documentation

## 2022-08-08 DIAGNOSIS — I10 Essential (primary) hypertension: Secondary | ICD-10-CM | POA: Diagnosis present

## 2022-08-08 DIAGNOSIS — I25119 Atherosclerotic heart disease of native coronary artery with unspecified angina pectoris: Secondary | ICD-10-CM

## 2022-08-08 MED ORDER — HYDRALAZINE HCL 100 MG PO TABS: 50.0000 mg | ORAL_TABLET | Freq: Two times a day (BID) | ORAL | 1 refills | Status: AC

## 2022-08-08 NOTE — Patient Instructions (Addendum)
Medication Instructions:  Your physician has recommended you make the following change in your medication:  Decrease hydralazine to 50 mg twice a day Continue all other medications as directed   Labwork: none  Testing/Procedures: none  Follow-Up:  Your physician recommends that you schedule a follow-up appointment in: 6 months  Any Other Special Instructions Will Be Listed Below (If Applicable).  If you need a refill on your cardiac medications before your next appointment, please call your pharmacy.

## 2022-08-29 ENCOUNTER — Other Ambulatory Visit: Payer: Self-pay

## 2022-08-29 DIAGNOSIS — I6523 Occlusion and stenosis of bilateral carotid arteries: Secondary | ICD-10-CM

## 2022-08-29 DIAGNOSIS — I739 Peripheral vascular disease, unspecified: Secondary | ICD-10-CM

## 2022-08-29 DIAGNOSIS — I70211 Atherosclerosis of native arteries of extremities with intermittent claudication, right leg: Secondary | ICD-10-CM

## 2022-08-29 DIAGNOSIS — I7143 Infrarenal abdominal aortic aneurysm, without rupture: Secondary | ICD-10-CM

## 2022-08-29 DIAGNOSIS — I714 Abdominal aortic aneurysm, without rupture, unspecified: Secondary | ICD-10-CM

## 2022-09-22 ENCOUNTER — Other Ambulatory Visit (HOSPITAL_COMMUNITY): Payer: Medicare Other

## 2022-09-22 ENCOUNTER — Encounter (HOSPITAL_COMMUNITY): Payer: Medicare Other

## 2022-09-22 ENCOUNTER — Ambulatory Visit: Payer: Medicare Other | Admitting: Surgery

## 2022-10-05 ENCOUNTER — Other Ambulatory Visit: Payer: Self-pay | Admitting: Cardiology

## 2022-10-05 DIAGNOSIS — I4821 Permanent atrial fibrillation: Secondary | ICD-10-CM

## 2022-10-07 NOTE — Telephone Encounter (Signed)
Prescription refill request for Eliquis received. Indication: Afib  Last office visit: 08/08/22 Diona Browner)  Scr: 0.89 (10/30/21)  Age: 71 Weight: 96.4kg  Appropriate dose and refill sent to requested pharmacy.

## 2022-10-08 ENCOUNTER — Other Ambulatory Visit: Payer: Self-pay | Admitting: *Deleted

## 2022-10-08 DIAGNOSIS — I70211 Atherosclerosis of native arteries of extremities with intermittent claudication, right leg: Secondary | ICD-10-CM

## 2022-10-08 DIAGNOSIS — I739 Peripheral vascular disease, unspecified: Secondary | ICD-10-CM

## 2022-10-08 DIAGNOSIS — I6523 Occlusion and stenosis of bilateral carotid arteries: Secondary | ICD-10-CM

## 2022-10-08 DIAGNOSIS — Z9889 Other specified postprocedural states: Secondary | ICD-10-CM

## 2022-10-27 ENCOUNTER — Ambulatory Visit (INDEPENDENT_AMBULATORY_CARE_PROVIDER_SITE_OTHER)
Admission: RE | Admit: 2022-10-27 | Discharge: 2022-10-27 | Disposition: A | Discharge status: Home / Self Care | Payer: Medicare Other | Source: Ambulatory Visit | Attending: Surgery | Admitting: Surgery

## 2022-10-27 ENCOUNTER — Ambulatory Visit (INDEPENDENT_AMBULATORY_CARE_PROVIDER_SITE_OTHER): Payer: Medicare Other | Admitting: Surgery

## 2022-10-27 ENCOUNTER — Ambulatory Visit (HOSPITAL_COMMUNITY)
Admission: RE | Admit: 2022-10-27 | Discharge: 2022-10-27 | Disposition: A | Discharge status: Home / Self Care | Payer: Medicare Other | Source: Ambulatory Visit | Attending: Surgery | Admitting: Surgery

## 2022-10-27 ENCOUNTER — Encounter: Payer: Self-pay | Admitting: Surgery

## 2022-10-27 VITALS — BP 129/83 | HR 80 | Temp 98.0°F | Resp 20 | Ht 65.0 in | Wt 212.0 lb

## 2022-10-27 DIAGNOSIS — Z9889 Other specified postprocedural states: Secondary | ICD-10-CM | POA: Insufficient documentation

## 2022-10-27 DIAGNOSIS — I70211 Atherosclerosis of native arteries of extremities with intermittent claudication, right leg: Secondary | ICD-10-CM

## 2022-10-27 DIAGNOSIS — I739 Peripheral vascular disease, unspecified: Secondary | ICD-10-CM

## 2022-10-27 DIAGNOSIS — I7143 Infrarenal abdominal aortic aneurysm, without rupture: Secondary | ICD-10-CM | POA: Diagnosis not present

## 2022-10-27 DIAGNOSIS — I6523 Occlusion and stenosis of bilateral carotid arteries: Secondary | ICD-10-CM

## 2022-10-27 LAB — VAS US ABI WITH/WO TBI
Left ABI: 0.88
Right ABI: 0.89

## 2022-10-27 NOTE — Progress Notes (Signed)
[facility] [place]  Patient name: Heather Livingston MRN: [id number] DOB: Jul 06, 1951 Sex: female   REASON FOR VISIT:    Follow up  [place] ILLNESS:    Heather Livingston is a 72 y.o. female who is status post endovascular aneurysm repair performed on 05/05/2018.  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  Prior to surgery she was having severe right hip and leg pain with minimal activity.  Her aneurysm measured 3.6 cm.  I felt that treating her aneurysm simultaneously would prevent future challenges with endovascular repair.  She did get some relief from her leg issues.  Her symptoms have been stable but are now attributed to her bone spurs.   The patient is medically managed for hypertension.  She takes a statin for hypercholesterolemia.   PAST MEDICAL HISTORY:   Past Medical History:  Diagnosis Date   Abdominal aortic aneurysm (AAA) (HCC)    Arthritis    Atrial fibrillation (HCC)    Bradycardia    Brain aneurysm    Cataracts, bilateral    Coronary atherosclerosis of native coronary artery    DES circ 10/10, residual 100% occluded SVG to OM, patent LIMA to LAD and SVG to PDA grafts, LVEF 50%   GERD (gastroesophageal reflux disease)    History of hiatal hernia    History of stroke    Incidental findings by CT imaging   Hyperlipemia    Hypertension    Low iron    MI (myocardial infarction) (HCC)    Nonruptured cerebral aneurysm    OSA (obstructive sleep apnea)    On CPAP   Pneumonia    Restless legs      FAMILY HISTORY:   Family History  Problem Relation Age of Onset   Hypertension Mother    Heart attack Father    Hypertension Brother     SOCIAL HISTORY:   Social History   Tobacco Use   Smoking status: Former    Packs/day: 0.75    Years: 43.00    Total pack years: 32.25    Types: Cigarettes    Start date: 09/13/1965    Quit date: 10/14/2007     Years since quitting: 15.0   Smokeless tobacco: Never  Substance Use Topics   Alcohol use: Yes    Alcohol/week: 0.0 standard drinks of alcohol    Comment: Occasional wine or beer     ALLERGIES:   No Known Allergies   CURRENT MEDICATIONS:   Current Outpatient Medications  Medication Sig Dispense Refill   amLODipine (NORVASC) 5 MG tablet TAKE 1 TABLET BY MOUTH EVERY DAY (Patient taking differently: Take 5 mg by mouth daily.) 90 tablet 3   aspirin EC 81 MG tablet Take 1 tablet (81 mg total) by mouth daily.     Azelastine HCl (ASTEPRO) 0.15 % SOLN Place 1 spray into the nose 2 (two) times daily.     BREO ELLIPTA 100-25 MCG/ACT AEPB Inhale 1 puff into the lungs daily.     ELIQUIS 5 MG TABS tablet TAKE 1 TABLET BY MOUTH TWICE A DAY 60 tablet 5   hydrALAZINE (APRESOLINE) 100 MG tablet Take 0.5 tablets (50 mg total) by mouth 2 (two) times daily. 90 tablet 1   hydrochlorothiazide (HYDRODIURIL) 12.5 MG tablet TAKE 2 TABLETS BY MOUTH EVERY DAY 180 tablet 3   ipratropium (ATROVENT) 0.03 % nasal spray Place into both nostrils as needed.     isosorbide mononitrate (  IMDUR) 60 MG 24 hr tablet TAKE 1 TABLET BY MOUTH EVERY DAY 90 tablet 3   losartan (COZAAR) 100 MG tablet TAKE 1 TABLET BY MOUTH EVERY DAY 90 tablet 1   magnesium oxide (MAG-OX) 400 (241.3 Mg) MG tablet Take 400 mg by mouth daily.   3   metoprolol tartrate (LOPRESSOR) 25 MG tablet TAKE 1&1/2 TABLETS (37.5 MG TOTAL) BY MOUTH 2 (TWO) TIMES DAILY. 270 tablet 1   montelukast (SINGULAIR) 10 MG tablet Take 10 mg by mouth daily.     nitroGLYCERIN (NITROSTAT) 0.4 MG SL tablet Place 1 tablet (0.4 mg total) under the tongue every 5 (five) minutes x 3 doses as needed for chest pain (if no relief after 2nd dose, proceed to the ED for an evaluation or call 911). 25 tablet 3   simvastatin (ZOCOR) 40 MG tablet TAKE 1 TABLET BY MOUTH EVERYDAY AT BEDTIME 90 tablet 3   spironolactone (ALDACTONE) 25 MG tablet TAKE 1/2 TABLET BY MOUTH EVERY DAY 45 tablet 3    No current facility-administered medications for this visit.    REVIEW OF SYSTEMS:   [X]  denotes positive finding, [ ]  denotes negative finding Cardiac  Comments:  Chest pain or chest pressure:    Shortness of breath upon exertion:    Short of breath when lying flat:    Irregular heart rhythm:        Vascular    Pain in calf, thigh, or hip brought on by ambulation:    Pain in feet at night that wakes you up from your sleep:     Blood clot in your veins:    Leg swelling:         Pulmonary    Oxygen at home:    Productive cough:     Wheezing:         Neurologic    Sudden weakness in arms or legs:     Sudden numbness in arms or legs:     Sudden onset of difficulty speaking or slurred speech:    Temporary loss of vision in one eye:     Problems with dizziness:         Gastrointestinal    Blood in stool:     Vomited blood:         Genitourinary    Burning when urinating:     Blood in urine:        Psychiatric    Major depression:         Hematologic    Bleeding problems:    Problems with blood clotting too easily:        Skin    Rashes or ulcers:        Constitutional    Fever or chills:      PHYSICAL EXAM:   Vitals:   10/27/22 0909 10/27/22 0910  BP: 133/81 129/83  Pulse: 80   Resp: 20   Temp: 98 F (36.7 C)   SpO2: 94%   Weight: 212 lb (96.2 kg)   Height: 5\' 5"  (1.651 m)     GENERAL: The patient is a well-nourished female, in no acute distress. The vital signs are documented above. CARDIAC: There is a regular rate and rhythm.  VASCULAR: Palpable posterior tibial pulses bilaterally PULMONARY: Non-labored respirations ABDOMEN: Soft and non-tender  MUSCULOSKELETAL: There are no major deformities or cyanosis. NEUROLOGIC: No focal weakness or paresthesias are detected. SKIN: There are no ulcers or rashes noted. PSYCHIATRIC: The patient has a normal affect.  STUDIES:  I have reviewed the following: Carotid: Right Carotid: Velocities in the  right ICA are consistent with a 1-39%  stenosis.   Left Carotid: Velocities in the left ICA are consistent with a 1-39%  stenosis.   Vertebrals: Bilateral vertebral arteries demonstrate antegrade flow.  Subclavians: Normal flow hemodynamics were seen in bilateral subclavian               arteries.   EVAR: Abdominal Aorta: The largest aortic measurement is 3.6 cm. Patent  endovascular aneurysm repair with no evidence of endoleak. Previous  diameter measurement was 3.0 cm.   ABI/TBIToday's ABIToday's TBIPrevious ABIPrevious TBI  +-------+-----------+-----------+------------+------------+  Right 0.89       0.83       1.19        1.01          +-------+-----------+-----------+------------+------------+  Left  0.88       0.70       1.11        0.72          +-------+-----------+-----------+------------+------------+   MEDICAL ISSUES:   AAA: Stable aneurysm on ultrasound today.  Repeat in 1 year  Leg pain: Her ABIs remained stable.  She has palpable pedal pulses.  All leg issues are attributed to her bone spurs and orthopedic issues.    Charlena Cross, MD, FACS [facility] [phone] Pager [phone]

## 2022-12-14 ENCOUNTER — Other Ambulatory Visit: Payer: Self-pay | Admitting: Cardiology

## 2023-02-26 ENCOUNTER — Ambulatory Visit: Payer: Medicare Other | Admitting: Cardiology

## 2023-03-12 ENCOUNTER — Telehealth: Payer: Self-pay | Admitting: Internal Medicine

## 2023-03-12 NOTE — Telephone Encounter (Signed)
Hi Dr. Leonides Schanz,    Supervising Provider PM 03/12/23  We received a referral for patient for a colonoscopy. Her [place] colon[place] was in 2023 in [place]. 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 Her records are in [facility] media for you to review and advise on scheduling.   Thank you.

## 2023-03-13 NOTE — Telephone Encounter (Signed)
Contacted patient and informed to have full colonoscopy and pathology reports faxed over.

## 2023-03-20 ENCOUNTER — Other Ambulatory Visit: Payer: Self-pay | Admitting: Cardiology

## 2023-03-22 NOTE — Progress Notes (Unsigned)
    Cardiology Office Note  Date: 03/22/2023   ID: ERION HERMANS, DOB 08-16-1951, MRN [id number]  History of Present Illness: Heather Livingston is a 72 y.o. female last seen in October 2023.  Physical Exam: VS:  There were no vitals taken for this visit., BMI There is no height or weight on file to calculate BMI.  Wt Readings from Last 3 Encounters:  10/27/22 212 lb (96.2 kg)  08/08/22 212 lb 9.6 oz (96.4 kg)  01/30/22 225 lb 3.2 oz (102.2 kg)    General: Patient appears comfortable at rest. HEENT: Conjunctiva and lids normal, oropharynx clear with moist mucosa. Neck: Supple, no elevated JVP or carotid bruits, no thyromegaly. Lungs: Clear to auscultation, nonlabored breathing at rest. Cardiac: Regular rate and rhythm, no S3 or significant systolic murmur, no pericardial rub. Abdomen: Soft, nontender, no hepatomegaly, bowel sounds present, no guarding or rebound. Extremities: No pitting edema, distal pulses 2+. Skin: Warm and dry. Musculoskeletal: No kyphosis. Neuropsychiatric: Alert and oriented x3, affect grossly appropriate.  ECG:  An ECG dated 01/30/2022 was personally reviewed today and demonstrated:  Atrial fibrillation with nonspecific ST-T changes.  Labwork:  January 2024: Hemoglobin A1c 5.8%, hemoglobin 16.3, platelets 98, BUN 17, creatinine 0.85, potassium 4.3, AST 27, ALT 24, cholesterol 84, HDL 33, LDL 35, triglycerides 79, TSH 2.408  Other Studies Reviewed Today:  No interval cardiac testing for review today.  Assessment and Plan:  1.  Multivessel CAD status post CABG in 2009 with LIMA to LAD, SVG to OM, and SVG to PDA.  She underwent DES to the circumflex in 2010 with finding of a occluded SVG to OM at that time.  Follow-up Lexiscan Myoview in August 2022 was consistent with infarct scar in the anterior and inferior distribution with mild inferior peri-infarct ischemia.  LVEF 54%.  2.  Permanent atrial fibrillation with CHA2DS2-VASc score of 7.  3.  Essential  hypertension.  4.  Mixed hyperlipidemia.  LDL 35 in January.  5.  History of abdominal aortic aneurysm status post EVAR with follow-up by [facility].  Duplex imaging in January showed no evidence of endoleak.  6.  Asymptomatic carotid artery disease.  Carotid Dopplers in January demonstrated 1 to 39% bilateral ICA stenosis.  Disposition:  Follow up {follow up:15908}  Signed, Jonelle Sidle, M.D., F.A.C.C. [facility] at [facility]

## 2023-03-23 ENCOUNTER — Ambulatory Visit: Payer: Medicare Other | Attending: Cardiology | Admitting: Cardiology

## 2023-03-23 ENCOUNTER — Encounter: Payer: Self-pay | Admitting: Cardiology

## 2023-03-23 VITALS — BP 146/70 | HR 69 | Ht 65.5 in | Wt 216.6 lb

## 2023-03-23 DIAGNOSIS — I4891 Unspecified atrial fibrillation: Secondary | ICD-10-CM | POA: Insufficient documentation

## 2023-03-23 DIAGNOSIS — I6523 Occlusion and stenosis of bilateral carotid arteries: Secondary | ICD-10-CM | POA: Insufficient documentation

## 2023-03-23 DIAGNOSIS — E782 Mixed hyperlipidemia: Secondary | ICD-10-CM | POA: Diagnosis present

## 2023-03-23 DIAGNOSIS — I4821 Permanent atrial fibrillation: Secondary | ICD-10-CM | POA: Insufficient documentation

## 2023-03-23 DIAGNOSIS — I25119 Atherosclerotic heart disease of native coronary artery with unspecified angina pectoris: Secondary | ICD-10-CM | POA: Insufficient documentation

## 2023-03-23 DIAGNOSIS — I10 Essential (primary) hypertension: Secondary | ICD-10-CM | POA: Diagnosis present

## 2023-03-23 MED ORDER — NITROGLYCERIN 0.4 MG SL SUBL: 0.4000 mg | SUBLINGUAL_TABLET | SUBLINGUAL | 3 refills | Status: AC | PRN

## 2023-03-23 NOTE — Patient Instructions (Addendum)

## 2023-04-01 ENCOUNTER — Telehealth: Payer: Self-pay | Admitting: Cardiology

## 2023-04-01 NOTE — Telephone Encounter (Signed)
   Pre-operative Risk Assessment    Patient Name: Heather Livingston  DOB: [birth year] MRN: [id number]      Request for Surgical Clearance    Procedure:   Colonoscopy  Date of Surgery:  Clearance 05/08/23                                 Surgeon:  Dr. Donavan Foil Surgeon's Group or Practice Name:  [facility]  Phone number:  [phone] Fax number:  [phone]   Type of Clearance Requested:   - Medical    Type of Anesthesia:  Not Indicated   Additional requests/questions:    [name]   04/01/2023, 10:39 AM

## 2023-04-03 NOTE — Telephone Encounter (Signed)
     Primary Cardiologist: Nona Dell, MD  Chart reviewed as part of pre-operative protocol coverage. Given past medical history and time since last visit, based on ACC/AHA guidelines, JAMIEKA ROYLE would be at acceptable risk for the planned procedure without further cardiovascular testing.   Per Dr. Diona Browner  "She was clinically stable at that time as per note.  In general colonoscopy is a low risk endeavor with sedation from a cardiac perspective.  She should be able to proceed without further cardiac testing presuming no change in clinical status.  Aspirin could be held 7 days and Eliquis 48 hours prior if necessary."  I will route this recommendation to the requesting party via Epic fax function and remove from pre-op pool.  Please call with questions.  Thomasene Ripple. Zackrey Dyar NP-C     04/03/2023, 9:45 AM [facility] [address] Office [phone] Fax [phone]

## 2023-04-12 ENCOUNTER — Other Ambulatory Visit: Payer: Self-pay | Admitting: Cardiology

## 2023-04-12 DIAGNOSIS — I4821 Permanent atrial fibrillation: Secondary | ICD-10-CM

## 2023-04-13 ENCOUNTER — Ambulatory Visit: Payer: Medicare Other | Admitting: Cardiology

## 2023-04-13 NOTE — Telephone Encounter (Signed)
Prescription refill request for Eliquis received. Indication: AF Last office visit: 03/23/23  Ival Bible MD Scr: 0.85 on 10/31/22  Epic Age: 72 Weight: 98.2kg  Based on above findings Eliquis 5mg  twice daily is the appropriate dose.  Refill approved.

## 2023-05-12 ENCOUNTER — Other Ambulatory Visit: Payer: Self-pay | Admitting: Cardiology

## 2023-06-20 ENCOUNTER — Other Ambulatory Visit: Payer: Self-pay | Admitting: Cardiology

## 2023-07-14 ENCOUNTER — Other Ambulatory Visit: Payer: Self-pay | Admitting: Cardiology

## 2023-08-25 ENCOUNTER — Telehealth: Payer: Self-pay | Admitting: Cardiology

## 2023-08-25 NOTE — Telephone Encounter (Signed)
   Pre-operative Risk Assessment    Patient Name: Heather Livingston  DOB: 1951/07/23 MRN: [id number]      Request for Surgical Clearance    Procedure:   EGD   Date of Surgery:  Clearance TBD                                 Surgeon:  NA  Surgeon's Group or Practice Name:  [facility]  Phone number:  [phone] Fax number:  [phone]   Type of Clearance Requested:   - Pharmacy:  Hold Clopidogrel (Plavix) ?   Type of Anesthesia:  Not Indicated   Additional requests/questions:    [name]   08/25/2023, 3:11 PM

## 2023-08-25 NOTE — Telephone Encounter (Signed)
Request indicates Plavix to held. Patient is not on Plavix but is on Eliquis. Can you please clarify this before we make recommendations?

## 2023-08-25 NOTE — Telephone Encounter (Signed)
Left message to call back to confirm blood thinner to be held.

## 2023-08-26 NOTE — Telephone Encounter (Signed)
RN from [facility] called back this morning and confirmed the pt is on Eliquis and not Plavix. She apologized for the error.

## 2023-08-26 NOTE — Telephone Encounter (Signed)
Pharmacy please advise on holding Eliquis prior to EDG scheduled for TBD. Thank you.

## 2023-08-27 NOTE — Telephone Encounter (Addendum)
Patient with diagnosis of atrial fibrillation on apixaban (Eliquis) 5 mg PO BID for anticoagulation.    Procedure: EGD Date of procedure: TBD clearance  CHA2DS2-VASc Score = 6   This indicates a 9.7% annual risk of stroke. The patient's score is based upon: CHF History: 0 HTN History: 1 Diabetes History: 0 Stroke History: 2 Vascular Disease History: 1 Age Score: 1 Gender Score: 1      CrCl 102.4 mL/min (ABW) Platelet count 100 (July 2024- Care Everywhere)  Per office protocol, patient can hold Eliquis for 1-2 days prior to procedure.    Patient will not need bridging with Lovenox (enoxaparin) around procedure.  **This guidance is not considered finalized until pre-operative APP has relayed final recommendations.**  Nils Pyle, PharmD PGY1 Pharmacy Resident

## 2023-08-28 NOTE — Telephone Encounter (Signed)
   Patient Name: Heather Livingston  DOB: 1951/07/11 MRN: [id number]  Primary Cardiologist: Nona Dell, MD  Clinical pharmacists have reviewed the patient's past medical history, labs, and current medications as part of preoperative protocol coverage. The following recommendations have been made:   Patient with diagnosis of atrial fibrillation on apixaban (Eliquis) 5 mg PO BID for anticoagulation.     Procedure: EGD Date of procedure: TBD clearance   CHA2DS2-VASc Score = 6   This indicates a 9.7% annual risk of stroke. The patient's score is based upon: CHF History: 0 HTN History: 1 Diabetes History: 0 Stroke History: 2 Vascular Disease History: 1 Age Score: 1 Gender Score: 1       CrCl 102.4 mL/min (ABW) Platelet count 100 (July 2024- Care Everywhere)   Per office protocol, patient can hold Eliquis for 1-2 days prior to procedure.    Patient will not need bridging with Lovenox (enoxaparin) around procedure. Please resume Eliquis as soon as possible postprocedure, at the discretion of the surgeon.    I will route this recommendation to the requesting party via Epic fax function and remove from pre-op pool.  Please call with questions.  Joylene Grapes, NP 08/28/2023, 7:47 AM

## 2023-08-31 ENCOUNTER — Telehealth: Payer: Self-pay | Admitting: Cardiology

## 2023-08-31 NOTE — Telephone Encounter (Signed)
Follow Up:     Patient is calling to check on the status of her clearance. She said her surgeon said they have not received the clearance.Marland Kitchen Marland Kitchen

## 2023-08-31 NOTE — Telephone Encounter (Signed)
I tried to call the pt back but no answer. The pt was cleared 08/25/23 by Bernadene Person, NP. Eliquis recommendations are that she can hold Eliquis x 1-2 days prior to procedure and resume once surgeon feels it is safe. I will re-fax clearance notes to the surgeon's office.

## 2023-09-01 ENCOUNTER — Telehealth: Payer: Self-pay | Admitting: Cardiology

## 2023-09-01 NOTE — Telephone Encounter (Signed)
Patient can hold simvastatin until she finishes her course of antibiotics

## 2023-09-01 NOTE — Telephone Encounter (Signed)
Pt c/o medication issue:  1. Name of Medication: simvastatin (ZOCOR) 40 MG tablet   2. How are you currently taking this medication (dosage and times per day)? TAKE 1 TABLET BY MOUTH EVERYDAY AT BEDTIME     3. Are you having a reaction (difficulty breathing--STAT)? No  4. What is your medication issue? Pt is requesting a callback regarding her wanting to see if its ok for her to continue taking this medication since she seen her PCP Dr. Melvyn Neth yesterday and he prescribed her Azithromycin 250 MG for chest congestion. She stated she took 500MG  to start today at 12:15pm but has to take one a day for the next 4 days. Please advise

## 2023-09-03 NOTE — Telephone Encounter (Signed)
Patient informed and verbalized understanding of plan. 

## 2023-09-17 ENCOUNTER — Telehealth: Payer: Self-pay | Admitting: Cardiology

## 2023-09-17 NOTE — Telephone Encounter (Signed)
Threasa Alpha A2 hours ago (1:42 PM)   [facility] Office called to say they didn't received our clearance. Stated that it should be fax to [phone]. Please advise       Note   [facility] [phone]  Threasa Alpha A

## 2023-09-17 NOTE — Telephone Encounter (Signed)
Office called to say they didn't received our clearance. Stated that it should be fax to [phone]. Please advise

## 2023-09-21 NOTE — Telephone Encounter (Signed)
[facility] called back stating they did not receive clearance. She confirmed the fax number, she asked that it faxed over again.  . Fax: [phone]

## 2023-09-21 NOTE — Telephone Encounter (Signed)
Clearance re faxed Epic [phone]

## 2023-09-29 ENCOUNTER — Other Ambulatory Visit: Payer: Self-pay | Admitting: Cardiology

## 2023-09-29 DIAGNOSIS — I4821 Permanent atrial fibrillation: Secondary | ICD-10-CM

## 2023-09-29 NOTE — Telephone Encounter (Signed)
Prescription refill request for Eliquis received. Indication: AF Last office visit: 03/23/23  Ival Bible MD Scr: 0.82 on 05/01/23  Epic Age: 72 Weight: 98.2kg  Based on above findings Eliquis 5mg  twice daily is the appropriate dose.  Refill approved.

## 2023-10-01 ENCOUNTER — Encounter: Payer: Self-pay | Admitting: Cardiology

## 2023-10-01 ENCOUNTER — Ambulatory Visit: Payer: Medicare Other | Attending: Cardiology | Admitting: Cardiology

## 2023-10-01 VITALS — BP 132/78 | HR 76 | Ht 65.5 in | Wt 215.2 lb

## 2023-10-01 DIAGNOSIS — I6523 Occlusion and stenosis of bilateral carotid arteries: Secondary | ICD-10-CM | POA: Insufficient documentation

## 2023-10-01 DIAGNOSIS — E782 Mixed hyperlipidemia: Secondary | ICD-10-CM | POA: Diagnosis not present

## 2023-10-01 DIAGNOSIS — I4821 Permanent atrial fibrillation: Secondary | ICD-10-CM | POA: Insufficient documentation

## 2023-10-01 DIAGNOSIS — I25119 Atherosclerotic heart disease of native coronary artery with unspecified angina pectoris: Secondary | ICD-10-CM | POA: Diagnosis present

## 2023-10-01 DIAGNOSIS — I10 Essential (primary) hypertension: Secondary | ICD-10-CM | POA: Insufficient documentation

## 2023-10-01 NOTE — Progress Notes (Signed)
    Cardiology Office Note  Date: 10/01/2023   ID: Heather Livingston, Heather Livingston Feb 27, 1951, MRN [id number]  History of Present Illness: Heather Livingston is a 72 y.o. female last seen in June.  She is here for a routine visit.  Reports no angina or nitroglycerin use.  Stable NYHA class II dyspnea at baseline.  She has had recent treatment for bronchitis per PCP.  States that she has not completely gotten over intermittent cough and shortness of breath related to this.  She had a chest x-ray late November describing changes consistent with COPD and mild atelectasis.  Reportedly also cardiac silhouette enlargement, no effusions or consolidation.  I reviewed her medications.  Current cardiovascular regimen includes aspirin, Norvasc, Eliquis, hydralazine, HCTZ, Imdur, Cozaar, Lopressor, Zocor, Aldactone, and as needed nitroglycerin.  I reviewed her lab work from earlier this year.  Last LDL was 35.  More recently hemoglobin and renal function were normal.  Physical Exam: VS:  BP 132/78   Pulse 76   Ht 5' 5.5" (1.664 m)   Wt 215 lb 3.2 oz (97.6 kg)   SpO2 96%   BMI 35.27 kg/m , BMI Body mass index is 35.27 kg/m.  Wt Readings from Last 3 Encounters:  10/01/23 215 lb 3.2 oz (97.6 kg)  03/23/23 216 lb 9.6 oz (98.2 kg)  10/27/22 212 lb (96.2 kg)    General: Patient appears comfortable at rest. HEENT: Conjunctiva and lids normal. Neck: Supple, no elevated JVP or carotid bruits. Lungs: Decreased breath sounds without wheezing. Cardiac: Irregularly irregular, no murmur or gallop. Extremities: No pitting edema.  ECG:  An ECG dated 03/23/2023 was personally reviewed today and demonstrated:  Atrial fibrillation with PVC and nonspecific ST-T changes.  Labwork:  January 2024: TSH 2.408, cholesterol 84, triglycerides 79, HDL 33, LDL 35, hemoglobin A1c 5.8% July 2024: Hemoglobin 16.9, platelets 100, potassium 4.8, BUN 15, creatinine 0.82  Other Studies Reviewed Today:  No interval cardiac testing for  review today.  Assessment and Plan:  1.  Multivessel CAD status post CABG in 2009 with LIMA to LAD, SVG to OM, and SVG to PDA.  She underwent DES to the circumflex in 2010 with finding of a occluded SVG to OM at that time.  Follow-up Lexiscan Myoview in August 2022 was consistent with infarct scar in the anterior and inferior distribution with mild inferior peri-infarct ischemia.  LVEF 54%.  She is clinically stable without angina or nitroglycerin use.  Plan to update echocardiogram to ensure no change in LVEF and in light of enlarged cardiac silhouette by chest x-ray in November.  Continue Lopressor, Norvasc, Imdur, and Zocor.   2.  Permanent atrial fibrillation with CHA2DS2-VASc score of 7.  She is on Eliquis for stroke prophylaxis.  No spontaneous bleeding problems.  Lab work from July reviewed.  Reports mild sense of palpitations, no progressive symptoms.  Continue Lopressor for heart rate control.   3.  Primary hypertension.  Blood pressure control is adequate today.  Continue present regimen.   4.  Mixed hyperlipidemia.  LDL 35 in January.  Continue Zocor.   5.  History of abdominal aortic aneurysm status post EVAR with follow-up by [facility].  Duplex imaging in January showed no evidence of endoleak.   6.  Asymptomatic carotid artery disease.  Carotid Dopplers in January demonstrated 1 to 39% bilateral ICA stenosis.  Disposition:  Follow up  6 months.  Signed, Jonelle Sidle, M.D., F.A.C.C. [facility] at [facility]

## 2023-10-01 NOTE — Patient Instructions (Addendum)

## 2023-10-12 ENCOUNTER — Telehealth: Payer: Self-pay | Admitting: Cardiology

## 2023-10-12 ENCOUNTER — Ambulatory Visit: Payer: Medicare Other | Attending: Cardiology

## 2023-10-12 DIAGNOSIS — I25119 Atherosclerotic heart disease of native coronary artery with unspecified angina pectoris: Secondary | ICD-10-CM | POA: Diagnosis not present

## 2023-10-12 LAB — ECHOCARDIOGRAM COMPLETE
AR max vel: 2.27 cm2
AV Area VTI: 2.34 cm2
AV Area mean vel: 2 cm2
AV Mean grad: 7 mm[Hg]
AV Peak grad: 11.3 mm[Hg]
Ao pk vel: 1.68 m/s
Calc EF: 51.2 %
Est EF: 55
MV M vel: 4.42 m/s
MV Peak grad: 78.1 mm[Hg]
MV VTI: 3.27 cm2
S' Lateral: 3.8 cm
Single Plane A2C EF: 46.6 %
Single Plane A4C EF: 55.3 %

## 2023-10-12 NOTE — Telephone Encounter (Signed)
Patient with diagnosis of atrial fibrillation on Eliquis for anticoagulation.    Procedure:   EGD and Colonoscopy (same day)    Date of Surgery:  Clearance TBD     CHA2DS2-VASc Score = 6   This indicates a 9.7% annual risk of stroke. 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   Patient will not need bridging with Lovenox (enoxaparin) around procedure.  **This guidance is not considered finalized until pre-operative APP has relayed final recommendations.**

## 2023-10-12 NOTE — Telephone Encounter (Signed)
       Pre-operative Risk Assessment    Patient Name: Heather Livingston  DOB: Aug 11, 1951 MRN: [id number]   Date of last office visit: 10/01/23 Date of next office visit: not scheduled yet      Request for Surgical Clearance    Procedure:   EGD and Colonoscopy (same day)   Date of Surgery:  Clearance TBD                                Surgeon:  N/A Surgeon's Group or Practice Name:  [facility]  Phone number:  [phone] Fax number:  [phone]    Type of Clearance Requested:   - Medical  - Pharmacy:  Hold Apixaban (Eliquis)     Type of Anesthesia:   She states "Propofol"    Additional requests/questions   [facility] is calling in stating they never received clearance previously requested for EGD on 08/26/23. She states she received OV notes but nothing about a clearance. Previous clearance they sent did not indicate they needed medical as well, only pharmacy. She states now they are wanting her to be cleared for Colonoscopy along with EGD. Please advise.    Alben Spittle   10/12/2023, 9:08 AM

## 2023-10-13 NOTE — Telephone Encounter (Addendum)
   Patient Name: Heather Livingston  DOB: March 19, 1951 MRN: [id number]  Primary Cardiologist: Jayson Sierras, MD  Chart reviewed as part of pre-operative protocol coverage. Pre-op clearance already addressed by colleagues in earlier phone notes. To summarize recommendations:  -She was clinically stable at recent office visit and unless this has changed, she should be able to proceed with low risk endoscopic procedure from a cardiac perspective.  She did have an echocardiogram performed today that shows overall normal LVEF at 55%.  -Dr.  Sierras  Per office protocol, patient can hold Eliquis  for 2 days prior to procedure.   Patient will not need bridging with Lovenox  (enoxaparin ) around procedure.    Will route this bundled recommendation to requesting provider via Epic fax function and remove from pre-op pool. Please call with questions.  Orren LOISE Fabry, PA-C 10/13/2023, 7:13 AM

## 2023-10-28 ENCOUNTER — Other Ambulatory Visit: Payer: Self-pay | Admitting: *Deleted

## 2023-10-28 DIAGNOSIS — Z9889 Other specified postprocedural states: Secondary | ICD-10-CM

## 2023-10-28 DIAGNOSIS — I739 Peripheral vascular disease, unspecified: Secondary | ICD-10-CM

## 2023-11-02 ENCOUNTER — Ambulatory Visit (HOSPITAL_COMMUNITY)
Admission: RE | Admit: 2023-11-02 | Discharge: 2023-11-02 | Disposition: A | Discharge status: Home / Self Care | Payer: Medicare Other | Source: Ambulatory Visit | Attending: Surgery | Admitting: Surgery

## 2023-11-02 ENCOUNTER — Encounter: Payer: Self-pay | Admitting: Surgery

## 2023-11-02 ENCOUNTER — Ambulatory Visit (INDEPENDENT_AMBULATORY_CARE_PROVIDER_SITE_OTHER): Payer: Medicare Other | Admitting: Surgery

## 2023-11-02 ENCOUNTER — Ambulatory Visit (INDEPENDENT_AMBULATORY_CARE_PROVIDER_SITE_OTHER)
Admission: RE | Admit: 2023-11-02 | Discharge: 2023-11-02 | Disposition: A | Discharge status: Home / Self Care | Payer: Medicare Other | Source: Ambulatory Visit | Attending: Surgery | Admitting: Surgery

## 2023-11-02 VITALS — BP 132/81 | HR 64 | Temp 98.2°F | Resp 20 | Ht 65.5 in | Wt 219.0 lb

## 2023-11-02 DIAGNOSIS — I70213 Atherosclerosis of native arteries of extremities with intermittent claudication, bilateral legs: Secondary | ICD-10-CM | POA: Diagnosis not present

## 2023-11-02 DIAGNOSIS — I739 Peripheral vascular disease, unspecified: Secondary | ICD-10-CM | POA: Diagnosis present

## 2023-11-02 DIAGNOSIS — I7143 Infrarenal abdominal aortic aneurysm, without rupture: Secondary | ICD-10-CM

## 2023-11-02 DIAGNOSIS — Z95828 Presence of other vascular implants and grafts: Secondary | ICD-10-CM

## 2023-11-02 DIAGNOSIS — Z9889 Other specified postprocedural states: Secondary | ICD-10-CM | POA: Insufficient documentation

## 2023-11-02 LAB — VAS US ABI WITH/WO TBI
Left ABI: 0.99
Right ABI: 1.19

## 2023-11-02 NOTE — Progress Notes (Signed)
[facility] [place]  Patient name: Heather Livingston MRN: [id number] DOB: 09/02/1951 Sex: female   REASON FOR VISIT:    Follow up  HISOTRY OF PRESENT ILLNESS:    Heather Livingston is a 73 y.o. female who is status post endovascular aneurysm repair performed on 05/05/2018.  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  Prior to surgery she was having severe right hip and leg pain with minimal activity.  Her aneurysm measured 3.6 cm.  I felt that treating her aneurysm simultaneously would prevent future challenges with endovascular repair.  She did get some relief from her leg issues.  Her symptoms have been stable.  Her biggest complaint today is that of balance issues.  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   The patient is medically managed for hypertension.  She takes a statin for hypercholesterolemia.  PAST MEDICAL HISTORY:   Past Medical History:  Diagnosis Date   Abdominal aortic aneurysm (AAA) (HCC)    Arthritis    Atrial fibrillation (HCC)    Bradycardia    Brain aneurysm    Cataracts, bilateral    Coronary atherosclerosis of native coronary artery    DES circ 10/10, residual 100% occluded SVG to OM, patent LIMA to LAD and SVG to PDA grafts, LVEF 50%   GERD (gastroesophageal reflux disease)    History of hiatal hernia    History of stroke    Incidental findings by CT imaging   Hyperlipemia    Hypertension    Low iron    MI (myocardial infarction) (HCC)    Nonruptured cerebral aneurysm    OSA (obstructive sleep apnea)    On CPAP   Pneumonia    Restless legs      FAMILY HISTORY:   Family History  Problem Relation Age of Onset   Hypertension Mother    Heart attack Father    Hypertension Brother     SOCIAL HISTORY:   Social History   Tobacco Use   Smoking status: Former    Current packs/day: 0.00    Average packs/day: 0.8 packs/day for  43.0 years (32.2 ttl pk-yrs)    Types: Cigarettes    Start date: 09/13/1965    Quit date: 10/14/2007    Years since quitting: 16.0   Smokeless tobacco: Never  Substance Use Topics   Alcohol use: Yes    Alcohol/week: 0.0 standard drinks of alcohol    Comment: Occasional wine or beer     ALLERGIES:   No Known Allergies   CURRENT MEDICATIONS:   Current Outpatient Medications  Medication Sig Dispense Refill   amLODipine (NORVASC) 5 MG tablet Take 1 tablet (5 mg total) by mouth daily. 90 tablet 2   aspirin EC 81 MG tablet Take 1 tablet (81 mg total) by mouth daily.     Azelastine HCl (ASTEPRO) 0.15 % SOLN Place 1 spray into the nose 2 (two) times daily.     benzonatate (TESSALON) 100 MG capsule Take by mouth.     ELIQUIS 5 MG TABS tablet TAKE 1 TABLET BY MOUTH TWICE A DAY 60 tablet 5   fluticasone furoate-vilanterol (BREO ELLIPTA) 100-25 MCG/ACT AEPB Inhale 1 puff into the lungs daily.     fluticasone-salmeterol (WIXELA INHUB) 250-50 MCG/ACT AEPB Inhale 1 puff twice a day by inhalation route as directed for 30 days, for COPD.     hydrALAZINE (APRESOLINE) 100 MG tablet Take 0.5 tablets (50 mg total)  by mouth 2 (two) times daily. 90 tablet 1   hydrochlorothiazide (HYDRODIURIL) 12.5 MG tablet TAKE 2 TABLETS BY MOUTH EVERY DAY 180 tablet 1   ipratropium (ATROVENT) 0.03 % nasal spray Place 2 sprays into both nostrils as needed for rhinitis.     isosorbide mononitrate (IMDUR) 60 MG 24 hr tablet TAKE 1 TABLET BY MOUTH EVERY DAY 90 tablet 3   losartan (COZAAR) 100 MG tablet TAKE 1 TABLET BY MOUTH EVERY DAY 90 tablet 1   magnesium oxide (MAG-OX) 400 (241.3 Mg) MG tablet Take 400 mg by mouth daily.   3   metoprolol tartrate (LOPRESSOR) 25 MG tablet TAKE 1&1/2 TABLETS (37.5 MG TOTAL) BY MOUTH 2 (TWO) TIMES DAILY. 270 tablet 1   montelukast (SINGULAIR) 10 MG tablet Take 10 mg by mouth daily.     nitroGLYCERIN (NITROSTAT) 0.4 MG SL tablet Place 1 tablet (0.4 mg total) under the tongue every 5 (five)  minutes x 3 doses as needed for chest pain (if no relief after 3rd dose, proceed to the ED or call 911). 25 tablet 3   pantoprazole (PROTONIX) 40 MG tablet Take 40 mg by mouth daily.     predniSONE (DELTASONE) 10 MG tablet Take by mouth.     simvastatin (ZOCOR) 40 MG tablet TAKE 1 TABLET BY MOUTH EVERYDAY AT BEDTIME 90 tablet 1   spironolactone (ALDACTONE) 25 MG tablet TAKE 1/2 TABLET BY MOUTH DAILY 45 tablet 3   sucralfate (CARAFATE) 1 g tablet Take 1 g by mouth 4 (four) times daily.     WIXELA INHUB 250-50 MCG/ACT AEPB Inhale 1 puff into the lungs 2 (two) times daily.     No current facility-administered medications for this visit.    REVIEW OF SYSTEMS:   [X]  denotes positive finding, [ ]  denotes negative finding Cardiac  Comments:  Chest pain or chest pressure:    Shortness of breath upon exertion:    Short of breath when lying flat:    Irregular heart rhythm:        Vascular    Pain in calf, thigh, or hip brought on by ambulation:    Pain in feet at night that wakes you up from your sleep:     Blood clot in your veins:    Leg swelling:         Pulmonary    Oxygen at home:    Productive cough:     Wheezing:         Neurologic    Sudden weakness in arms or legs:     Sudden numbness in arms or legs:     Sudden onset of difficulty speaking or slurred speech:    Temporary loss of vision in one eye:     Problems with dizziness:         Gastrointestinal    Blood in stool:     Vomited blood:         Genitourinary    Burning when urinating:     Blood in urine:        Psychiatric    Major depression:         Hematologic    Bleeding problems:    Problems with blood clotting too easily:        Skin    Rashes or ulcers:        Constitutional    Fever or chills:      PHYSICAL EXAM:   Vitals:   11/02/23 1102  BP: 132/81  Pulse:  64  Resp: 20  Temp: 98.2 F (36.8 C)  SpO2: 93%  Weight: 219 lb (99.3 kg)  Height: 5' 5.5" (1.664 m)    GENERAL: The patient is  a well-nourished female, in no acute distress. The vital signs are documented above. CARDIAC: There is a regular rate and rhythm.  VASCULAR: Palpable posterior tibial pulse bilaterally PULMONARY: Non-labored respirations ABDOMEN: Soft and non-tender MUSCULOSKELETAL: There are no major deformities or cyanosis. NEUROLOGIC: No focal weakness or paresthesias are detected. SKIN: There are no ulcers or rashes noted. PSYCHIATRIC: The patient has a normal affect.  STUDIES:   I have reviewed the following:  Abdominal Aorta: Patent endovascular aneurysm repair with no evidence of  endoleak.  Maximum diameter measurements may be understimated due to large amounts of  overlying bowel gas.  Prior sac size measured 3.65 cm. Maximum diameter measuremet obtained on  today's exam is 2.85 cm.     ABI/TBIToday's ABIToday's TBIPrevious ABIPrevious TBI  +-------+-----------+-----------+------------+------------+  Right 1.19       0.72       0.89        0.83          +-------+-----------+-----------+------------+------------+  Left  0.99       0.74       0.88        0.70          +-------+-----------+-----------+------------+------------+   MEDICAL ISSUES:   AAA: Aortic diameters 2.85 cm today.  This remained stable.  I will reimage this in a year  PAD: ABIs remained stable with palpable pedal pulses and no claudication symptoms.   Balance: The patient has a history of endovascular treatment of the basilar artery aneurysm several years ago.  I will refer her back to Dr. Conchita Paris to make sure there are no new issues, as the patient is worried about this.  She is also scheduled to see Dr. Melvyn Neth, her primary care physician tomorrow to evaluate her for other potential etiologies for her symptoms.    Charlena Cross, MD, FACS [facility] [phone] Pager [phone]

## 2023-11-16 ENCOUNTER — Other Ambulatory Visit: Payer: Self-pay | Admitting: Neurosurgery

## 2023-11-16 DIAGNOSIS — I671 Cerebral aneurysm, nonruptured: Secondary | ICD-10-CM

## 2023-11-19 ENCOUNTER — Other Ambulatory Visit: Payer: Self-pay | Admitting: Neurosurgery

## 2023-11-24 ENCOUNTER — Other Ambulatory Visit: Payer: Self-pay

## 2023-11-24 DIAGNOSIS — I739 Peripheral vascular disease, unspecified: Secondary | ICD-10-CM

## 2023-11-24 DIAGNOSIS — I7143 Infrarenal abdominal aortic aneurysm, without rupture: Secondary | ICD-10-CM

## 2023-12-22 ENCOUNTER — Other Ambulatory Visit: Payer: Self-pay | Admitting: Neurosurgery

## 2023-12-22 ENCOUNTER — Encounter: Payer: Self-pay | Admitting: Neurosurgery

## 2023-12-22 ENCOUNTER — Ambulatory Visit (HOSPITAL_COMMUNITY)
Admission: RE | Admit: 2023-12-22 | Discharge: 2023-12-22 | Disposition: A | Discharge status: Home / Self Care | Payer: Medicare Other | Source: Ambulatory Visit | Attending: Neurosurgery | Admitting: Neurosurgery

## 2023-12-22 DIAGNOSIS — I671 Cerebral aneurysm, nonruptured: Secondary | ICD-10-CM | POA: Diagnosis present

## 2023-12-22 DIAGNOSIS — I651 Occlusion and stenosis of basilar artery: Secondary | ICD-10-CM | POA: Diagnosis not present

## 2023-12-22 DIAGNOSIS — Z87891 Personal history of nicotine dependence: Secondary | ICD-10-CM | POA: Insufficient documentation

## 2023-12-22 HISTORY — PX: IR ANGIO INTRA EXTRACRAN SEL INTERNAL CAROTID BILAT MOD SED: IMG5363

## 2023-12-22 HISTORY — PX: IR ANGIO VERTEBRAL SEL VERTEBRAL UNI L MOD SED: IMG5367

## 2023-12-22 LAB — PROTIME-INR
INR: 1.1 (ref 0.8–1.2)
Prothrombin Time: 14.6 s (ref 11.4–15.2)

## 2023-12-22 LAB — BASIC METABOLIC PANEL
Anion gap: 11 (ref 5–15)
BUN: 26 mg/dL — ABNORMAL HIGH (ref 8–23)
CO2: 19 mmol/L — ABNORMAL LOW (ref 22–32)
Calcium: 9.4 mg/dL (ref 8.9–10.3)
Chloride: 111 mmol/L (ref 98–111)
Creatinine, Ser: 0.78 mg/dL (ref 0.44–1.00)
GFR, Estimated: >60 mL/min (ref >60)
Glucose, Bld: 97 mg/dL (ref 70–99)
Potassium: 4 mmol/L (ref 3.5–5.1)
Sodium: 141 mmol/L (ref 135–145)

## 2023-12-22 LAB — CBC WITH DIFFERENTIAL/PLATELET
Abs Immature Granulocytes: 0.02 10*3/uL (ref 0.00–0.07)
Basophils Absolute: 0.1 10*3/uL (ref 0.0–0.1)
Basophils Relative: 1 %
Eosinophils Absolute: 0.1 10*3/uL (ref 0.0–0.5)
Eosinophils Relative: 3 %
HCT: 45.5 % (ref 36.0–46.0)
Hemoglobin: 15.5 g/dL — ABNORMAL HIGH (ref 12.0–15.0)
Immature Granulocytes: 0 %
Lymphocytes Relative: 35 %
Lymphs Abs: 1.7 10*3/uL (ref 0.7–4.0)
MCH: 31.5 pg (ref 26.0–34.0)
MCHC: 34.1 g/dL (ref 30.0–36.0)
MCV: 92.5 fL (ref 80.0–100.0)
Monocytes Absolute: 0.6 10*3/uL (ref 0.1–1.0)
Monocytes Relative: 12 %
Neutro Abs: 2.4 10*3/uL (ref 1.7–7.7)
Neutrophils Relative %: 49 %
Platelets: 211 10*3/uL (ref 150–400)
RBC: 4.92 MIL/uL (ref 3.87–5.11)
RDW: 13.2 % (ref 11.5–15.5)
WBC: 4.9 10*3/uL (ref 4.0–10.5)
nRBC: 0 % (ref 0.0–0.2)

## 2023-12-22 LAB — APTT: aPTT: 39 s — ABNORMAL HIGH (ref 24–36)

## 2023-12-22 MED ORDER — CEFAZOLIN SODIUM-DEXTROSE 2-4 GM/100ML-% IV SOLN
2.0000 g | INTRAVENOUS | Status: DC
Start: 2023-12-22 — End: 2023-12-23

## 2023-12-22 MED ORDER — FENTANYL CITRATE (PF) 100 MCG/2ML IJ SOLN
INTRAMUSCULAR | Status: AC
  Filled 2023-12-22: qty 2

## 2023-12-22 MED ORDER — HYDROCODONE-ACETAMINOPHEN 5-325 MG PO TABS: 1.0000 | ORAL_TABLET | ORAL | Status: DC | PRN

## 2023-12-22 MED ORDER — LIDOCAINE HCL 1 % IJ SOLN
INTRAMUSCULAR | Status: AC
  Filled 2023-12-22: qty 20

## 2023-12-22 MED ORDER — CHLORHEXIDINE GLUCONATE CLOTH 2 % EX PADS: 6.0000 | MEDICATED_PAD | Freq: Once | CUTANEOUS | Status: DC

## 2023-12-22 MED ORDER — LIDOCAINE HCL 1 % IJ SOLN
20.0000 mL | Freq: Once | INTRAMUSCULAR | Status: AC
  Administered 2023-12-22: 10 mL

## 2023-12-22 MED ORDER — HEPARIN SODIUM (PORCINE) 1000 UNIT/ML IJ SOLN
INTRAMUSCULAR | Status: AC
Start: 2023-12-22 — End: ?
  Filled 2023-12-22: qty 10

## 2023-12-22 MED ORDER — IOHEXOL 300 MG/ML  SOLN
100.0000 mL | Freq: Once | INTRAMUSCULAR | Status: AC | PRN
  Administered 2023-12-22: 40 mL via INTRA_ARTERIAL

## 2023-12-22 MED ORDER — MIDAZOLAM HCL 2 MG/2ML IJ SOLN
INTRAMUSCULAR | Status: AC
  Filled 2023-12-22: qty 2

## 2023-12-22 MED ORDER — MIDAZOLAM HCL 2 MG/2ML IJ SOLN
INTRAMUSCULAR | Status: DC | PRN
  Administered 2023-12-22: .5 mg via INTRAVENOUS

## 2023-12-22 MED ORDER — FENTANYL CITRATE (PF) 100 MCG/2ML IJ SOLN
INTRAMUSCULAR | Status: DC | PRN
  Administered 2023-12-22: 25 ug via INTRAVENOUS

## 2023-12-22 NOTE — Sedation Documentation (Signed)
 29F angioseal deployed to right femoral artery by MD

## 2023-12-22 NOTE — Sedation Documentation (Addendum)
 Hemostasis obtained at 1028- guaze & tegaderm applied to right groin. Clean dry & intact

## 2023-12-22 NOTE — Sedation Documentation (Signed)
 Short stay room unavailable patient to be monitored by IR RN until notified.

## 2023-12-22 NOTE — H&P (Signed)
 Chief Complaint   Aneurysm  History of Present Illness  Heather Livingston is a 73 year old woman previously seen by me more than five years ago after undergoing elective coil embolization of an incidentally discovered basilar aneurysm. Patient did undergo short-term angiographic follow-up about six months after that which did not demonstrate any aneurysm recanalization. She was sent lost to follow-up, although did undergo endovascular repair of a AAA and some iliac disease by vascular surgery.   Past Medical History   Past Medical History:  Diagnosis Date   Abdominal aortic aneurysm (AAA) (HCC)    Arthritis    Atrial fibrillation (HCC)    Bradycardia    Brain aneurysm    Cataracts, bilateral    Coronary atherosclerosis of native coronary artery    DES circ 10/10, residual 100% occluded SVG to OM, patent LIMA to LAD and SVG to PDA grafts, LVEF 50%   GERD (gastroesophageal reflux disease)    History of hiatal hernia    History of stroke    Incidental findings by CT imaging   Hyperlipemia    Hypertension    Low iron    MI (myocardial infarction) (HCC)    Nonruptured cerebral aneurysm    OSA (obstructive sleep apnea)    On CPAP   Pneumonia    Restless legs     Past Surgical History   Past Surgical History:  Procedure Laterality Date   ABDOMINAL AORTIC ENDOVASCULAR STENT GRAFT N/A 05/05/2018   Procedure: ABDOMINAL AORTIC ENDOVASCULAR STENT GRAFT;  Surgeon: Nada Libman, MD;  Location: [facility] OR;  Service: Vascular;  Laterality: N/A;   ABDOMINAL AORTOGRAM N/A 01/19/2018   Procedure: ABDOMINAL AORTOGRAM;  Surgeon: Corky Crafts, MD;  Location: [facility] INVASIVE CV LAB;  Service: Cardiovascular;  Laterality: N/A;   ABDOMINAL HYSTERECTOMY     CARPAL TUNNEL RELEASE     CORONARY ARTERY BYPASS GRAFT  2009   [facility], LIMA to LAD, SVG to OM, SVG to PDA   ENDOVASCULAR REPAIR/STENT GRAFT  05/05/2018   IR ANGIO INTRA EXTRACRAN SEL INTERNAL CAROTID BILAT MOD SED  02/23/2018   IR ANGIO  VERTEBRAL SEL SUBCLAVIAN INNOMINATE UNI R MOD SED  04/14/2019   IR ANGIO VERTEBRAL SEL VERTEBRAL BILAT MOD SED  02/23/2018   IR ANGIO VERTEBRAL SEL VERTEBRAL UNI L MOD SED  03/18/2018   IR ANGIOGRAM FOLLOW UP STUDY  03/18/2018   IR ANGIOGRAM FOLLOW UP STUDY  03/18/2018   IR ANGIOGRAM FOLLOW UP STUDY  03/18/2018   IR ANGIOGRAM FOLLOW UP STUDY  03/18/2018   IR TRANSCATH/EMBOLIZ  03/18/2018   IR US GUIDE VASC ACCESS RIGHT  04/14/2019   LEFT HEART CATH AND CORS/GRAFTS ANGIOGRAPHY N/A 01/19/2018   Procedure: LEFT HEART CATH AND CORS/GRAFTS ANGIOGRAPHY;  Surgeon: Corky Crafts, MD;  Location: [facility] INVASIVE CV LAB;  Service: Cardiovascular;  Laterality: N/A;   LEFT HEART CATHETERIZATION WITH CORONARY/GRAFT ANGIOGRAM N/A 12/27/2012   Procedure: LEFT HEART CATHETERIZATION WITH Isabel Caprice;  Surgeon: Herby Abraham, MD;  Location: [facility] CATH LAB;  Service: Cardiovascular;  Laterality: N/A;   RADIOLOGY WITH ANESTHESIA N/A 03/18/2018   Procedure: coil embolization of basilar aneurysm;  Surgeon: Lisbeth Renshaw, MD;  Location: [facility] OR;  Service: Radiology;  Laterality: N/A;    Social History   Social History   Tobacco Use   Smoking status: Former    Current packs/day: 0.00    Average packs/day: 0.8 packs/day for 43.0 years (32.2 ttl pk-yrs)    Types: Cigarettes    Start date: 09/13/1965  Quit date: 10/14/2007    Years since quitting: 16.2   Smokeless tobacco: Never  Vaping Use   Vaping status: Never Used  Substance Use Topics   Alcohol use: Yes    Alcohol/week: 0.0 standard drinks of alcohol    Comment: Occasional wine or beer   Drug use: No    Medications   Prior to Admission medications   Medication Sig Start Date End Date Taking? Authorizing Provider  amLODipine (NORVASC) 5 MG tablet Take 1 tablet (5 mg total) by mouth daily. 05/12/23  Yes Jonelle Sidle, MD  aspirin EC 81 MG tablet Take 1 tablet (81 mg total) by mouth daily. 01/14/13  Yes Serpe, Clide Deutscher, PA-C  ELIQUIS 5 MG TABS  tablet TAKE 1 TABLET BY MOUTH TWICE A DAY 09/29/23  Yes Jonelle Sidle, MD  fluticasone-salmeterol [facility] INHUB) 250-50 MCG/ACT AEPB Inhale 1 puff twice a day by inhalation route as directed for 30 days, for COPD.   Yes [provider]  hydrALAZINE (APRESOLINE) 100 MG tablet Take 0.5 tablets (50 mg total) by mouth 2 (two) times daily. 08/08/22  Yes Jonelle Sidle, MD  hydrochlorothiazide (HYDRODIURIL) 12.5 MG tablet TAKE 2 TABLETS BY MOUTH EVERY DAY 07/15/23  Yes Jonelle Sidle, MD  isosorbide mononitrate (IMDUR) 60 MG 24 hr tablet TAKE 1 TABLET BY MOUTH EVERY DAY 03/20/23  Yes Jonelle Sidle, MD  losartan (COZAAR) 100 MG tablet TAKE 1 TABLET BY MOUTH EVERY DAY 07/15/23  Yes Jonelle Sidle, MD  magnesium oxide (MAG-OX) 400 (241.3 Mg) MG tablet Take 400 mg by mouth daily.  06/03/16  Yes [provider]  metoprolol tartrate (LOPRESSOR) 25 MG tablet TAKE 1&1/2 TABLETS (37.5 MG TOTAL) BY MOUTH 2 (TWO) TIMES DAILY. 07/15/23  Yes Jonelle Sidle, MD  montelukast (SINGULAIR) 10 MG tablet Take 10 mg by mouth daily. 11/09/19  Yes [provider]  simvastatin (ZOCOR) 40 MG tablet TAKE 1 TABLET BY MOUTH EVERYDAY AT BEDTIME 07/15/23  Yes Jonelle Sidle, MD  spironolactone (ALDACTONE) 25 MG tablet TAKE 1/2 TABLET BY MOUTH DAILY 06/22/23  Yes Jonelle Sidle, MD  WIXELA INHUB 250-50 MCG/ACT AEPB Inhale 1 puff into the lungs 2 (two) times daily.   Yes [provider]  Azelastine HCl (ASTEPRO) 0.15 % SOLN Place 1 spray into the nose 2 (two) times daily.    [provider]  benzonatate (TESSALON) 100 MG capsule Take by mouth. 09/23/23   [provider]  fluticasone furoate-vilanterol (BREO ELLIPTA) 100-25 MCG/ACT AEPB Inhale 1 puff into the lungs daily.    [provider]  ipratropium (ATROVENT) 0.03 % nasal spray Place 2 sprays into both nostrils as needed for rhinitis.    [provider]  nitroGLYCERIN (NITROSTAT) 0.4 MG SL  tablet Place 1 tablet (0.4 mg total) under the tongue every 5 (five) minutes x 3 doses as needed for chest pain (if no relief after 3rd dose, proceed to the ED or call 911). 03/23/23   Jonelle Sidle, MD  pantoprazole (PROTONIX) 40 MG tablet Take 40 mg by mouth daily. 08/03/23   [provider]  predniSONE (DELTASONE) 10 MG tablet Take by mouth. 09/23/23   [provider]  sucralfate (CARAFATE) 1 g tablet Take 1 g by mouth 4 (four) times daily. 09/29/23   [provider]    Allergies  No Known Allergies  Review of Systems  ROS  Neurologic Exam  Awake, alert, oriented Memory and concentration grossly intact Speech fluent,  appropriate CN grossly intact Motor exam: Upper Extremities Deltoid Bicep Tricep Grip  Right 5/5 5/5 5/5 5/5  Left 5/5 5/5 5/5 5/5   Lower Extremities IP Quad PF DF EHL  Right 5/5 5/5 5/5 5/5 5/5  Left 5/5 5/5 5/5 5/5 5/5   Sensation grossly intact to LT    Impression  - 73 y.o. female >30yrs s/p coil embolization of unruptured basilar aneurysm  Plan  - Will proceed with diagnostic angiogram  I have reviewed the indications for the procedure as well as the details of the procedure and the expected postoperative course and recovery at length with the patient in the office. We have also reviewed in detail the risks, benefits, and alternatives to the procedure. All questions were answered and Heather Livingston provided informed consent to proceed.  Lisbeth Renshaw, MD [facility]

## 2023-12-22 NOTE — Progress Notes (Signed)
 1330- patient ambulated to BR right groin remains unremarkable.

## 2023-12-29 NOTE — Op Note (Signed)
 DIAGNOSTIC CEREBRAL ANGIOGRAM   OPERATOR:   Dr. Lisbeth Renshaw, MD  HISTORY:   The patient is a 73 y.o. yo female 73 year old woman with a history of incidentally discovered basilar apex aneurysm for which she underwent elective coil embolization more than 5 years ago.  She did undergo short-term angiographic follow-up but was lost to follow-up subsequent to that.  She recently was seen back in the outpatient neurosurgery clinic and presents today for longer-term angiographic follow-up of her aneurysm.  APPROACH:   The technical aspects of the procedure as well as its potential risks and benefits were reviewed with the patient. These risks included but were not limited bleeding, infection, allergic reaction, damage to organs/vital structures, stroke, non-diagnostic procedure, and the catastrophic outcomes of heart attack, coma, and death. With an understanding of these risks, informed consent was obtained and witnessed.    The patient was placed in the supine position on the angiography table and the skin of right groin prepped in the usual sterile fashion. The procedure was performed under local anesthesia (1%-solution of bicarbonate-bufferred Lidoacaine) and conscious sedation with Versed and fentanyl monitored by myself and the in-suite nurse with noninvasive blood pressure and continuous pulse oximetry.    A 5- French sheath was introduced in the right common femoral artery using Seldinger technique.  A fluorophase sequence was used to document the sheath position.    HEPARIN: 2000 Units total.   CONTRAST AGENT: See IR records   FLUOROSCOPY TIME: See IR records  CATHETER(S) AND WIRE(S):    5-French JB-1 glidecatheter   0.035" glidewire    VESSELS CATHETERIZED:   Right internal carotid   Left internal carotid   Left vertebral   Right common femoral  VESSELS STUDIED:   Right internal carotid Left internal carotid Left vertebral Right femoral  PROCEDURAL NARRATIVE:   A 5-Fr  JB-1 terumo glide catheter was advanced over a 0.035 glidewire into the aortic arch. The above vessels were then sequentially catheterized and cervical/cerebral angiograms taken. After review of images, the catheter was removed without incident.    INTERPRETATION:    Right internal carotid: head:   Injection reveals the presence of a widely patent ICA, M1, and A1 segments and their branches. There is no significant stenosis, occlusion, aneurysm or high flow vascular malformation visualized.  The parenchymal and venous phases are normal. The venous sinuses are widely patent.    Left internal carotid: head:   Injection reveals the presence of a widely patent ICA, A1, and M1 segments and their branches. There is no significant stenosis, occlusion, aneurysm, or high flow vascular malformation visualized. The parenchymal and venous phases are normal. The venous sinuses are widely patent.    Left vertebral:   Injection reveals the presence of a widely patent vertebral artery. This leads to a widely patent basilar artery that terminates in bilateral P1. The basilar apex is normal.  Large coil mass is seen just distal to the origin of the left superior cerebellar artery.  No aneurysm recurrence or filling is identified.  There is no stenosis within the basilar artery.  The parenchymal and venous phases are normal. The venous sinuses are widely patent.    Right femoral:    Normal vessel. No significant atherosclerotic disease. Arterial sheath in adequate position.   DISPOSITION:  Upon completion of the study, the femoral sheath was removed and hemostasis obtained using a 6-Fr Angio-Seal closure device. Good proximal and distal lower extremity pulses were documented upon achievement of hemostasis.  The procedure was well tolerated and no early complications were observed.       The patient was transferred back to the holding area to be positioned flat in bed for 3 hours of observation.    IMPRESSION:   1.  Persistent occlusion of a previously treated basilar apex aneurysm with coil embolization.  No stenosis is noted.  No new aneurysms are identified.  The preliminary results of this procedure were shared with the patient and the patient's family.

## 2024-01-01 ENCOUNTER — Encounter (HOSPITAL_COMMUNITY): Payer: Self-pay

## 2024-02-23 ENCOUNTER — Other Ambulatory Visit: Payer: Self-pay | Admitting: Cardiology

## 2024-02-29 ENCOUNTER — Other Ambulatory Visit: Payer: Self-pay | Admitting: Cardiology

## 2024-03-17 ENCOUNTER — Other Ambulatory Visit: Payer: Self-pay | Admitting: Cardiology

## 2024-04-26 ENCOUNTER — Other Ambulatory Visit: Payer: Self-pay | Admitting: Cardiology

## 2024-04-26 DIAGNOSIS — I4821 Permanent atrial fibrillation: Secondary | ICD-10-CM

## 2024-04-26 NOTE — Telephone Encounter (Signed)
 Prescription refill request for Eliquis  received. Indication: AF Last office visit: 10/01/23  GORMAN Sierras MD Scr: 0.84 on 04/08/24  Epic Age: 73 Weight: 97.6kg  Based on above findings Eliquis  5mg  twice daily is the appropriate dose.  Refill approved.

## 2024-06-10 ENCOUNTER — Other Ambulatory Visit: Payer: Self-pay | Admitting: Cardiology

## 2024-07-22 ENCOUNTER — Other Ambulatory Visit: Payer: Self-pay | Admitting: Cardiology

## 2024-08-08 ENCOUNTER — Ambulatory Visit: Attending: Cardiology | Admitting: Cardiology

## 2024-08-08 ENCOUNTER — Encounter: Payer: Self-pay | Admitting: Cardiology

## 2024-08-08 VITALS — BP 132/80 | HR 78 | Ht 65.5 in | Wt 224.2 lb

## 2024-08-08 DIAGNOSIS — I6523 Occlusion and stenosis of bilateral carotid arteries: Secondary | ICD-10-CM | POA: Diagnosis present

## 2024-08-08 DIAGNOSIS — I4821 Permanent atrial fibrillation: Secondary | ICD-10-CM | POA: Insufficient documentation

## 2024-08-08 DIAGNOSIS — I25119 Atherosclerotic heart disease of native coronary artery with unspecified angina pectoris: Secondary | ICD-10-CM | POA: Diagnosis not present

## 2024-08-08 DIAGNOSIS — I10 Essential (primary) hypertension: Secondary | ICD-10-CM | POA: Insufficient documentation

## 2024-08-08 DIAGNOSIS — E782 Mixed hyperlipidemia: Secondary | ICD-10-CM | POA: Diagnosis not present

## 2024-08-08 NOTE — Progress Notes (Signed)
 Cardiology Office Note  Date: 08/08/2024   ID: Heather, Livingston [birth year], MRN [id number]  History of Present Illness: Heather Livingston is a 73 y.o. female last seen in December 2024.  She is here for a follow-up visit.  Reports no angina or interval nitroglycerin  use.  No increasing dyspnea with typical activities.  She has been having trouble sleeping, also with seasonal allergies.  I went over her medications.  No significant changes in cardiac regimen.  She does not report any spontaneous bleeding problems on Eliquis .  I did review her interval lab work.  LDL was 33 in August.  I reviewed her ECG today which shows atrial fibrillation at 82 bpm, nonspecific ST-T changes, possible old inferior infarct pattern.  Physical Exam: VS:  BP 132/80 (BP Location: Left Arm, Patient Position: Sitting, Cuff Size: Large)   Pulse 78   Ht 5' 5.5 (1.664 m)   Wt 224 lb 3.2 oz (101.7 kg)   SpO2 95%   BMI 36.74 kg/m , BMI Body mass index is 36.74 kg/m.  Wt Readings from Last 3 Encounters:  08/08/24 224 lb 3.2 oz (101.7 kg)  12/22/23 200 lb (90.7 kg)  11/02/23 219 lb (99.3 kg)    General: Patient appears comfortable at rest. HEENT: Conjunctiva and lids normal. Neck: Supple, no elevated JVP or carotid bruits. Lungs: Clear to auscultation, nonlabored breathing at rest. Cardiac: Irregularly irregular, no significant murmur or gallop.  ECG:  An ECG dated 03/23/2023 was personally reviewed today and demonstrated:  Atrial fibrillation with PVC and nonspecific ST-T changes.  Labwork: 12/22/2023: BUN 26; Creatinine, Ser 0.78; Hemoglobin 15.5; Platelets 211; Potassium 4.0; Sodium 141  August 2025: Hemoglobin A1c 5.8%, hemoglobin 17, platelets 135, potassium 4.2, BUN 15, creatinine 0.8, potassium 4.2, GFR 76, AST 23, ALT 24, TSH 2.10, cholesterol 87, triglycerides 113, HDL 31, LDL 33  Other Studies Reviewed Today:  Echocardiogram 10/12/2023:  1. Inferior basal hypokinesis . Left ventricular  ejection fraction, by  estimation, is 55%. The left ventricle has normal function. The left  ventricle has no regional wall motion abnormalities. Left ventricular  diastolic parameters were normal.   2. Right ventricular systolic function is normal. The right ventricular  size is normal.   3. Left atrial size was moderately dilated.   4. Right atrial size was severely dilated.   5. The mitral valve is abnormal. Trivial mitral valve regurgitation. No  evidence of mitral stenosis. Moderate mitral annular calcification.   6. Tricuspid valve regurgitation is moderate.   7. The aortic valve is tricuspid. There is mild calcification of the  aortic valve. Aortic valve regurgitation is not visualized. Aortic valve  sclerosis is present, with no evidence of aortic valve stenosis.   8. Aortic dilatation noted. There is moderate dilatation of the ascending  aorta, measuring 43 mm.   9. The inferior vena cava is dilated in size with >50% respiratory  variability, suggesting right atrial pressure of 8 mmHg.   Assessment and Plan:  1.  Multivessel CAD status post CABG in 2009 with LIMA to LAD, SVG to OM, and SVG to PDA.  She underwent DES to the circumflex in 2010 with finding of a occluded SVG to OM at that time.  Follow-up Lexiscan  Myoview  in August 2022 was consistent with infarct scar in the anterior and inferior distribution with mild inferior peri-infarct ischemia.  Echocardiogram in December 2024 showed normal LVEF at 55%.  No angina or interval nitroglycerin  use.  Continue aspirin  81 mg daily,  statin, and as a nitroglycerin .   2.  Permanent atrial fibrillation with CHA2DS2-VASc score of 7.  Heart rate controlled and no active palpitations.  Continue Lopressor  37.5 mg twice daily and Eliquis  5 mg twice daily.  I reviewed her interval lab work.  No spontaneous bleeding problems reported.   3.  Primary hypertension.  No change in current regimen which also includes Aldactone  12.5 mg daily, Cozaar  100  mg daily, hydralazine  50 mg twice daily and Norvasc  5 mg daily.   4.  Mixed hyperlipidemia.  LDL 33 in August.  Continue Zocor  40 mg daily.   5.  History of abdominal aortic aneurysm status post EVAR with follow-up by [facility].   6.  Asymptomatic carotid artery disease.  Carotid Dopplers in January 2024 demonstrated 1 to 39% bilateral ICA stenosis.  Disposition:  Follow up 6 months.  Signed, Jayson JUDITHANN Sierras, M.D., F.A.C.C. [facility]

## 2024-08-08 NOTE — Patient Instructions (Addendum)

## 2024-10-30 ENCOUNTER — Other Ambulatory Visit: Payer: Self-pay | Admitting: Cardiology

## 2024-10-30 DIAGNOSIS — I4821 Permanent atrial fibrillation: Secondary | ICD-10-CM

## 2024-11-11 ENCOUNTER — Other Ambulatory Visit: Payer: Self-pay | Admitting: Cardiology

## 2024-11-11 MED ORDER — LOSARTAN POTASSIUM 100 MG PO TABS: 100.0000 mg | ORAL_TABLET | Freq: Every day | ORAL | 3 refills | Status: AC

## 2025-01-02 ENCOUNTER — Ambulatory Visit (HOSPITAL_COMMUNITY)

## 2025-01-02 ENCOUNTER — Ambulatory Visit: Admitting: Surgery

## 2025-02-13 ENCOUNTER — Ambulatory Visit: Admitting: Cardiology
